# Patient Record
Sex: Female | Born: 1955 | Race: Black or African American | Hispanic: No | Marital: Single | Smoking: Never smoker
Health system: Southern US, Community
[De-identification: ages and names within clinical notes are randomized; demographics above are authoritative.]

## PROBLEM LIST (undated history)

## (undated) DIAGNOSIS — J40 Bronchitis, not specified as acute or chronic: Secondary | ICD-10-CM

## (undated) DIAGNOSIS — N2889 Other specified disorders of kidney and ureter: Secondary | ICD-10-CM

## (undated) DIAGNOSIS — K469 Unspecified abdominal hernia without obstruction or gangrene: Secondary | ICD-10-CM

## (undated) DIAGNOSIS — T7840XA Allergy, unspecified, initial encounter: Secondary | ICD-10-CM

## (undated) DIAGNOSIS — K219 Gastro-esophageal reflux disease without esophagitis: Secondary | ICD-10-CM

## (undated) DIAGNOSIS — N838 Other noninflammatory disorders of ovary, fallopian tube and broad ligament: Secondary | ICD-10-CM

## (undated) DIAGNOSIS — Z87898 Personal history of other specified conditions: Secondary | ICD-10-CM

## (undated) DIAGNOSIS — D649 Anemia, unspecified: Secondary | ICD-10-CM

## (undated) DIAGNOSIS — F419 Anxiety disorder, unspecified: Secondary | ICD-10-CM

## (undated) HISTORY — PX: ESOPHAGOGASTRODUODENOSCOPY: SHX1529

## (undated) HISTORY — DX: Allergy, unspecified, initial encounter: T78.40XA

## (undated) HISTORY — DX: Other noninflammatory disorders of ovary, fallopian tube and broad ligament: N83.8

## (undated) HISTORY — PX: EXPLORATORY LAPAROTOMY: SUR591

## (undated) HISTORY — PX: COLONOSCOPY: SHX174

## (undated) HISTORY — DX: Anxiety disorder, unspecified: F41.9

## (undated) HISTORY — DX: Other specified disorders of kidney and ureter: N28.89

## (undated) HISTORY — DX: Personal history of other specified conditions: Z87.898

## (undated) HISTORY — DX: Anemia, unspecified: D64.9

---

## 1982-12-24 HISTORY — PX: CHOLECYSTECTOMY: SHX55

## 1982-12-24 HISTORY — PX: ABDOMINAL HYSTERECTOMY: SHX81

## 2009-07-14 ENCOUNTER — Encounter: Admission: RE | Admit: 2009-07-14 | Discharge: 2009-07-14 | Payer: Self-pay | Admitting: Infectious Diseases

## 2009-08-18 ENCOUNTER — Emergency Department (HOSPITAL_COMMUNITY): Admission: EM | Admit: 2009-08-18 | Discharge: 2009-08-18 | Payer: Self-pay | Admitting: Emergency Medicine

## 2009-11-15 ENCOUNTER — Emergency Department (HOSPITAL_COMMUNITY): Admission: EM | Admit: 2009-11-15 | Discharge: 2009-11-15 | Payer: Self-pay | Admitting: Family Medicine

## 2009-12-16 ENCOUNTER — Emergency Department (HOSPITAL_COMMUNITY): Admission: EM | Admit: 2009-12-16 | Discharge: 2009-12-17 | Payer: Self-pay | Admitting: Emergency Medicine

## 2009-12-24 DIAGNOSIS — N2889 Other specified disorders of kidney and ureter: Secondary | ICD-10-CM

## 2009-12-24 HISTORY — DX: Other specified disorders of kidney and ureter: N28.89

## 2010-01-04 ENCOUNTER — Encounter (INDEPENDENT_AMBULATORY_CARE_PROVIDER_SITE_OTHER): Payer: Self-pay | Admitting: Family Medicine

## 2010-01-04 ENCOUNTER — Ambulatory Visit: Payer: Self-pay | Admitting: Family Medicine

## 2010-01-04 LAB — CONVERTED CEMR LAB
ALT: 101 units/L — ABNORMAL HIGH (ref 0–35)
AST: 69 units/L — ABNORMAL HIGH (ref 0–37)
Albumin: 4.8 g/dL (ref 3.5–5.2)
Alkaline Phosphatase: 136 units/L — ABNORMAL HIGH (ref 39–117)
BUN: 16 mg/dL (ref 6–23)
Basophils Absolute: 0 10*3/uL (ref 0.0–0.1)
Basophils Relative: 0 % (ref 0–1)
CO2: 23 meq/L (ref 19–32)
Calcium: 9.9 mg/dL (ref 8.4–10.5)
Chloride: 105 meq/L (ref 96–112)
Creatinine, Ser: 0.72 mg/dL (ref 0.40–1.20)
Eosinophils Absolute: 0.2 10*3/uL (ref 0.0–0.7)
Eosinophils Relative: 4 % (ref 0–5)
Glucose, Bld: 87 mg/dL (ref 70–99)
HCT: 37.9 % (ref 36.0–46.0)
Helicobacter Pylori Antibody-IgG: 0.9
Hemoglobin: 12.6 g/dL (ref 12.0–15.0)
Lymphocytes Relative: 53 % — ABNORMAL HIGH (ref 12–46)
Lymphs Abs: 3.1 10*3/uL (ref 0.7–4.0)
MCHC: 33.2 g/dL (ref 30.0–36.0)
MCV: 84.8 fL (ref 78.0–100.0)
Monocytes Absolute: 0.4 10*3/uL (ref 0.1–1.0)
Monocytes Relative: 8 % (ref 3–12)
Neutro Abs: 2.1 10*3/uL (ref 1.7–7.7)
Neutrophils Relative %: 36 % — ABNORMAL LOW (ref 43–77)
Platelets: 365 10*3/uL (ref 150–400)
Potassium: 4 meq/L (ref 3.5–5.3)
RBC: 4.47 M/uL (ref 3.87–5.11)
RDW: 13.3 % (ref 11.5–15.5)
Sed Rate: 31 mm/hr — ABNORMAL HIGH (ref 0–22)
Sodium: 142 meq/L (ref 135–145)
TSH: 1.446 microintl units/mL (ref 0.350–4.500)
Total Bilirubin: 0.2 mg/dL — ABNORMAL LOW (ref 0.3–1.2)
Total Protein: 7.6 g/dL (ref 6.0–8.3)
Vit D, 25-Hydroxy: 22 ng/mL — ABNORMAL LOW (ref 30–89)
WBC: 5.9 10*3/uL (ref 4.0–10.5)

## 2010-01-17 ENCOUNTER — Emergency Department (HOSPITAL_COMMUNITY): Admission: EM | Admit: 2010-01-17 | Discharge: 2010-01-17 | Payer: Self-pay | Admitting: Emergency Medicine

## 2010-01-24 ENCOUNTER — Ambulatory Visit (HOSPITAL_COMMUNITY): Admission: RE | Admit: 2010-01-24 | Discharge: 2010-01-24 | Payer: Self-pay | Admitting: Family Medicine

## 2010-02-03 ENCOUNTER — Ambulatory Visit (HOSPITAL_COMMUNITY): Admission: RE | Admit: 2010-02-03 | Discharge: 2010-02-03 | Payer: Self-pay | Admitting: Family Medicine

## 2010-02-08 ENCOUNTER — Ambulatory Visit: Payer: Self-pay | Admitting: Family Medicine

## 2010-02-17 ENCOUNTER — Ambulatory Visit (HOSPITAL_COMMUNITY): Admission: RE | Admit: 2010-02-17 | Discharge: 2010-02-17 | Payer: Self-pay | Admitting: Family Medicine

## 2010-03-01 ENCOUNTER — Ambulatory Visit (HOSPITAL_COMMUNITY): Admission: RE | Admit: 2010-03-01 | Discharge: 2010-03-01 | Payer: Self-pay | Admitting: Gastroenterology

## 2010-03-15 ENCOUNTER — Ambulatory Visit (HOSPITAL_COMMUNITY): Admission: RE | Admit: 2010-03-15 | Discharge: 2010-03-15 | Payer: Self-pay | Admitting: Gastroenterology

## 2010-07-20 ENCOUNTER — Encounter (INDEPENDENT_AMBULATORY_CARE_PROVIDER_SITE_OTHER): Payer: Self-pay | Admitting: Family Medicine

## 2010-07-20 ENCOUNTER — Ambulatory Visit: Payer: Self-pay | Admitting: Family Medicine

## 2010-07-20 LAB — CONVERTED CEMR LAB
ALT: 70 units/L — ABNORMAL HIGH (ref 0–35)
ANA Titer 1: NEGATIVE
AST: 59 units/L — ABNORMAL HIGH (ref 0–37)
Albumin: 4.8 g/dL (ref 3.5–5.2)
Alkaline Phosphatase: 170 units/L — ABNORMAL HIGH (ref 39–117)
Anti Nuclear Antibody(ANA): POSITIVE — AB
BUN: 14 mg/dL (ref 6–23)
CO2: 24 meq/L (ref 19–32)
CRP: 1.4 mg/dL — ABNORMAL HIGH (ref <0.6)
Calcium: 9 mg/dL (ref 8.4–10.5)
Ceruloplasmin: 47 mg/dL (ref 21–63)
Chloride: 104 meq/L (ref 96–112)
Creatinine, Ser: 0.72 mg/dL (ref 0.40–1.20)
Ferritin: 168 ng/mL (ref 10–291)
Glucose, Bld: 82 mg/dL (ref 70–99)
HCV Ab: NEGATIVE
Hep A IgM: NEGATIVE
Hep A Total Ab: POSITIVE — AB
Hep B C IgM: NEGATIVE
Hep B Core Total Ab: NEGATIVE
Hep B S Ab: POSITIVE — AB
Hepatitis B Surface Ag: NEGATIVE
IgA: 138 mg/dL (ref 68–378)
IgG (Immunoglobin G), Serum: 1150 mg/dL (ref 694–1618)
IgM, Serum: 226 mg/dL (ref 60–263)
Iron: 53 ug/dL (ref 42–145)
Potassium: 4.2 meq/L (ref 3.5–5.3)
Saturation Ratios: 16 % — ABNORMAL LOW (ref 20–55)
Sed Rate: 39 mm/hr — ABNORMAL HIGH (ref 0–22)
Sodium: 141 meq/L (ref 135–145)
TIBC: 341 ug/dL (ref 250–470)
Total Bilirubin: 0.3 mg/dL (ref 0.3–1.2)
Total Protein: 7.7 g/dL (ref 6.0–8.3)
UIBC: 288 ug/dL
Vitamin B-12: 616 pg/mL (ref 211–911)

## 2010-07-25 ENCOUNTER — Ambulatory Visit: Payer: Self-pay | Admitting: Internal Medicine

## 2010-10-05 ENCOUNTER — Emergency Department (HOSPITAL_COMMUNITY): Admission: EM | Admit: 2010-10-05 | Discharge: 2010-10-05 | Payer: Self-pay | Admitting: Emergency Medicine

## 2011-01-09 ENCOUNTER — Ambulatory Visit (HOSPITAL_COMMUNITY)
Admission: RE | Admit: 2011-01-09 | Discharge: 2011-01-09 | Payer: Self-pay | Source: Home / Self Care | Attending: Family Medicine | Admitting: Family Medicine

## 2011-01-15 ENCOUNTER — Emergency Department (HOSPITAL_COMMUNITY)
Admission: EM | Admit: 2011-01-15 | Discharge: 2011-01-16 | Payer: Self-pay | Source: Home / Self Care | Admitting: Emergency Medicine

## 2011-01-16 LAB — CBC
HCT: 34 % — ABNORMAL LOW (ref 36.0–46.0)
Hemoglobin: 11.5 g/dL — ABNORMAL LOW (ref 12.0–15.0)
MCH: 28.2 pg (ref 26.0–34.0)
MCHC: 33.8 g/dL (ref 30.0–36.0)
MCV: 83.3 fL (ref 78.0–100.0)
RBC: 4.08 MIL/uL (ref 3.87–5.11)
RDW: 13.7 % (ref 11.5–15.5)
WBC: 6.2 10*3/uL (ref 4.0–10.5)

## 2011-01-16 LAB — POCT I-STAT, CHEM 8
Calcium, Ion: 1.14 mmol/L (ref 1.12–1.32)
Creatinine, Ser: 0.8 mg/dL (ref 0.4–1.2)
Glucose, Bld: 102 mg/dL — ABNORMAL HIGH (ref 70–99)
Hemoglobin: 11.9 g/dL — ABNORMAL LOW (ref 12.0–15.0)
Potassium: 4.3 mEq/L (ref 3.5–5.1)
TCO2: 24 mmol/L (ref 0–100)

## 2011-01-16 LAB — DIFFERENTIAL
Eosinophils Relative: 1 % (ref 0–5)
Lymphocytes Relative: 36 % (ref 12–46)
Lymphs Abs: 2.2 10*3/uL (ref 0.7–4.0)
Monocytes Absolute: 0.3 10*3/uL (ref 0.1–1.0)
Monocytes Relative: 6 % (ref 3–12)
Neutro Abs: 3.5 10*3/uL (ref 1.7–7.7)

## 2011-01-17 LAB — POCT CARDIAC MARKERS
CKMB, poc: <1 ng/mL — ABNORMAL LOW (ref 1.0–8.0)
Myoglobin, poc: 58.6 ng/mL (ref 12–200)

## 2011-01-17 LAB — TSH: TSH: 2.089 u[IU]/mL (ref 0.350–4.500)

## 2011-01-18 ENCOUNTER — Other Ambulatory Visit (HOSPITAL_COMMUNITY): Payer: Self-pay | Admitting: Family Medicine

## 2011-01-18 DIAGNOSIS — Z1239 Encounter for other screening for malignant neoplasm of breast: Secondary | ICD-10-CM

## 2011-01-24 ENCOUNTER — Ambulatory Visit (HOSPITAL_COMMUNITY): Payer: Self-pay

## 2011-01-26 ENCOUNTER — Ambulatory Visit (HOSPITAL_COMMUNITY)
Admission: RE | Admit: 2011-01-26 | Discharge: 2011-01-26 | Disposition: A | Discharge status: Home / Self Care | Payer: Self-pay | Source: Ambulatory Visit | Attending: Family Medicine | Admitting: Family Medicine

## 2011-01-26 DIAGNOSIS — Z1231 Encounter for screening mammogram for malignant neoplasm of breast: Secondary | ICD-10-CM | POA: Insufficient documentation

## 2011-01-26 DIAGNOSIS — Z1239 Encounter for other screening for malignant neoplasm of breast: Secondary | ICD-10-CM

## 2011-03-05 ENCOUNTER — Other Ambulatory Visit: Payer: Self-pay | Admitting: Family Medicine

## 2011-03-06 ENCOUNTER — Encounter (INDEPENDENT_AMBULATORY_CARE_PROVIDER_SITE_OTHER): Payer: Self-pay | Admitting: *Deleted

## 2011-03-06 LAB — CONVERTED CEMR LAB
Chlamydia, DNA Probe: NEGATIVE
GC Probe Amp, Genital: NEGATIVE

## 2011-03-08 LAB — DIFFERENTIAL
Basophils Absolute: 0 10*3/uL (ref 0.0–0.1)
Basophils Relative: 0 % (ref 0–1)
Eosinophils Absolute: 0.2 10*3/uL (ref 0.0–0.7)
Eosinophils Relative: 3 % (ref 0–5)
Lymphocytes Relative: 44 % (ref 12–46)
Lymphs Abs: 2.7 10*3/uL (ref 0.7–4.0)
Monocytes Absolute: 0.4 10*3/uL (ref 0.1–1.0)
Monocytes Relative: 6 % (ref 3–12)
Neutro Abs: 2.8 10*3/uL (ref 1.7–7.7)
Neutrophils Relative %: 47 % (ref 43–77)

## 2011-03-08 LAB — CBC
HCT: 33.7 % — ABNORMAL LOW (ref 36.0–46.0)
Hemoglobin: 11.3 g/dL — ABNORMAL LOW (ref 12.0–15.0)
MCH: 28.4 pg (ref 26.0–34.0)
MCHC: 33.5 g/dL (ref 30.0–36.0)
MCV: 84.7 fL (ref 78.0–100.0)
Platelets: 236 10*3/uL (ref 150–400)
RBC: 3.98 MIL/uL (ref 3.87–5.11)
RDW: 13.4 % (ref 11.5–15.5)
WBC: 6.1 10*3/uL (ref 4.0–10.5)

## 2011-03-08 LAB — COMPREHENSIVE METABOLIC PANEL
ALT: 32 U/L (ref 0–35)
AST: 28 U/L (ref 0–37)
Albumin: 3.9 g/dL (ref 3.5–5.2)
Alkaline Phosphatase: 102 U/L (ref 39–117)
BUN: 10 mg/dL (ref 6–23)
CO2: 24 mEq/L (ref 19–32)
Calcium: 8.9 mg/dL (ref 8.4–10.5)
Chloride: 111 mEq/L (ref 96–112)
Creatinine, Ser: 0.67 mg/dL (ref 0.4–1.2)
GFR calc Af Amer: >60 mL/min (ref >60)
GFR calc non Af Amer: >60 mL/min (ref >60)
Glucose, Bld: 92 mg/dL (ref 70–99)
Potassium: 3.9 mEq/L (ref 3.5–5.1)
Sodium: 140 mEq/L (ref 135–145)
Total Bilirubin: 0.3 mg/dL (ref 0.3–1.2)
Total Protein: 6.9 g/dL (ref 6.0–8.3)

## 2011-03-08 LAB — URINALYSIS, ROUTINE W REFLEX MICROSCOPIC
Bilirubin Urine: NEGATIVE
Glucose, UA: NEGATIVE mg/dL
Hgb urine dipstick: NEGATIVE
Ketones, ur: NEGATIVE mg/dL
Nitrite: NEGATIVE
Protein, ur: NEGATIVE mg/dL
Specific Gravity, Urine: 1.013 (ref 1.005–1.030)
Urobilinogen, UA: 0.2 mg/dL (ref 0.0–1.0)
pH: 6 (ref 5.0–8.0)

## 2011-03-08 LAB — POCT I-STAT, CHEM 8
BUN: 11 mg/dL (ref 6–23)
Chloride: 111 mEq/L (ref 96–112)
HCT: 34 % — ABNORMAL LOW (ref 36.0–46.0)
Potassium: 3.9 mEq/L (ref 3.5–5.1)
Sodium: 142 mEq/L (ref 135–145)

## 2011-03-08 LAB — LIPASE, BLOOD: Lipase: 22 U/L (ref 11–59)

## 2011-03-09 ENCOUNTER — Encounter (INDEPENDENT_AMBULATORY_CARE_PROVIDER_SITE_OTHER): Payer: Self-pay | Admitting: *Deleted

## 2011-03-09 LAB — CONVERTED CEMR LAB
AST: 30 units/L (ref 0–37)
Alkaline Phosphatase: 121 units/L — ABNORMAL HIGH (ref 39–117)
Bilirubin, Direct: <0.1 mg/dL (ref 0.0–0.3)
Cholesterol: 193 mg/dL (ref 0–200)
HDL: 56 mg/dL (ref >39)
Helicobacter Pylori Antibody-IgG: 0.76
Total CHOL/HDL Ratio: 3.4
Triglycerides: 93 mg/dL (ref <150)
VLDL: 19 mg/dL (ref 0–40)

## 2011-03-19 LAB — HEPATIC FUNCTION PANEL
Albumin: 3.6 g/dL (ref 3.5–5.2)
Total Bilirubin: 0.5 mg/dL (ref 0.3–1.2)
Total Protein: 6.9 g/dL (ref 6.0–8.3)

## 2011-03-25 ENCOUNTER — Emergency Department (HOSPITAL_COMMUNITY)
Admission: EM | Admit: 2011-03-25 | Discharge: 2011-03-25 | Disposition: A | Discharge status: Home / Self Care | Payer: Self-pay | Attending: Emergency Medicine | Admitting: Emergency Medicine

## 2011-03-25 ENCOUNTER — Emergency Department (HOSPITAL_COMMUNITY): Payer: Self-pay

## 2011-03-25 DIAGNOSIS — M25476 Effusion, unspecified foot: Secondary | ICD-10-CM | POA: Insufficient documentation

## 2011-03-25 DIAGNOSIS — S93409A Sprain of unspecified ligament of unspecified ankle, initial encounter: Secondary | ICD-10-CM | POA: Insufficient documentation

## 2011-03-25 DIAGNOSIS — Y9383 Activity, rough housing and horseplay: Secondary | ICD-10-CM | POA: Insufficient documentation

## 2011-03-25 DIAGNOSIS — K219 Gastro-esophageal reflux disease without esophagitis: Secondary | ICD-10-CM | POA: Insufficient documentation

## 2011-03-25 DIAGNOSIS — M25579 Pain in unspecified ankle and joints of unspecified foot: Secondary | ICD-10-CM | POA: Insufficient documentation

## 2011-03-25 DIAGNOSIS — X500XXA Overexertion from strenuous movement or load, initial encounter: Secondary | ICD-10-CM | POA: Insufficient documentation

## 2011-03-25 DIAGNOSIS — S8990XA Unspecified injury of unspecified lower leg, initial encounter: Secondary | ICD-10-CM | POA: Insufficient documentation

## 2011-03-25 DIAGNOSIS — S99929A Unspecified injury of unspecified foot, initial encounter: Secondary | ICD-10-CM | POA: Insufficient documentation

## 2011-03-25 DIAGNOSIS — M25473 Effusion, unspecified ankle: Secondary | ICD-10-CM | POA: Insufficient documentation

## 2011-03-26 LAB — DIFFERENTIAL
Basophils Relative: 0 % (ref 0–1)
Lymphocytes Relative: 45 % (ref 12–46)
Lymphs Abs: 2.9 10*3/uL (ref 0.7–4.0)
Monocytes Absolute: 0.4 10*3/uL (ref 0.1–1.0)
Monocytes Relative: 6 % (ref 3–12)
Neutro Abs: 3 10*3/uL (ref 1.7–7.7)
Neutrophils Relative %: 47 % (ref 43–77)

## 2011-03-26 LAB — COMPREHENSIVE METABOLIC PANEL
Albumin: 4.2 g/dL (ref 3.5–5.2)
BUN: 12 mg/dL (ref 6–23)
Calcium: 9.7 mg/dL (ref 8.4–10.5)
Creatinine, Ser: 0.67 mg/dL (ref 0.4–1.2)
Glucose, Bld: 91 mg/dL (ref 70–99)
Potassium: 3.6 mEq/L (ref 3.5–5.1)
Total Protein: 7.9 g/dL (ref 6.0–8.3)

## 2011-03-26 LAB — URINALYSIS, ROUTINE W REFLEX MICROSCOPIC
Bilirubin Urine: NEGATIVE
Nitrite: NEGATIVE
Specific Gravity, Urine: 1.03 (ref 1.005–1.030)
Urobilinogen, UA: 0.2 mg/dL (ref 0.0–1.0)
pH: 5.5 (ref 5.0–8.0)

## 2011-03-26 LAB — URINE CULTURE: Colony Count: 9000

## 2011-03-26 LAB — PREGNANCY, URINE: Preg Test, Ur: NEGATIVE

## 2011-03-26 LAB — CBC
HCT: 34.3 % — ABNORMAL LOW (ref 36.0–46.0)
Hemoglobin: 11.5 g/dL — ABNORMAL LOW (ref 12.0–15.0)
MCHC: 33.7 g/dL (ref 30.0–36.0)
MCV: 88.4 fL (ref 78.0–100.0)
Platelets: 341 10*3/uL (ref 150–400)
RDW: 13.7 % (ref 11.5–15.5)

## 2011-11-19 ENCOUNTER — Other Ambulatory Visit (HOSPITAL_COMMUNITY): Payer: Self-pay | Admitting: Family Medicine

## 2011-11-19 DIAGNOSIS — Z1231 Encounter for screening mammogram for malignant neoplasm of breast: Secondary | ICD-10-CM

## 2011-11-27 ENCOUNTER — Other Ambulatory Visit (HOSPITAL_COMMUNITY): Payer: Self-pay | Admitting: Family Medicine

## 2011-11-27 DIAGNOSIS — R911 Solitary pulmonary nodule: Secondary | ICD-10-CM

## 2011-12-03 ENCOUNTER — Inpatient Hospital Stay (HOSPITAL_COMMUNITY): Admission: RE | Admit: 2011-12-03 | Payer: Self-pay | Source: Ambulatory Visit

## 2011-12-21 ENCOUNTER — Ambulatory Visit (HOSPITAL_COMMUNITY)
Admission: RE | Admit: 2011-12-21 | Discharge: 2011-12-21 | Disposition: A | Discharge status: Home / Self Care | Payer: Self-pay | Source: Ambulatory Visit | Attending: Family Medicine | Admitting: Family Medicine

## 2011-12-21 DIAGNOSIS — J984 Other disorders of lung: Secondary | ICD-10-CM | POA: Insufficient documentation

## 2011-12-21 DIAGNOSIS — R911 Solitary pulmonary nodule: Secondary | ICD-10-CM

## 2011-12-21 DIAGNOSIS — Z09 Encounter for follow-up examination after completed treatment for conditions other than malignant neoplasm: Secondary | ICD-10-CM | POA: Insufficient documentation

## 2011-12-21 MED ORDER — IOHEXOL 300 MG/ML  SOLN
80.0000 mL | Freq: Once | INTRAMUSCULAR | Status: AC | PRN
  Administered 2011-12-21: 80 mL via INTRAVENOUS

## 2012-01-29 ENCOUNTER — Ambulatory Visit (HOSPITAL_COMMUNITY)
Admission: RE | Admit: 2012-01-29 | Discharge: 2012-01-29 | Disposition: A | Discharge status: Home / Self Care | Payer: Self-pay | Source: Ambulatory Visit | Attending: Family Medicine | Admitting: Family Medicine

## 2012-01-29 DIAGNOSIS — Z1231 Encounter for screening mammogram for malignant neoplasm of breast: Secondary | ICD-10-CM | POA: Insufficient documentation

## 2012-03-12 ENCOUNTER — Other Ambulatory Visit (HOSPITAL_COMMUNITY): Payer: Self-pay | Admitting: Internal Medicine

## 2012-03-12 DIAGNOSIS — Z780 Asymptomatic menopausal state: Secondary | ICD-10-CM

## 2012-03-18 ENCOUNTER — Ambulatory Visit (HOSPITAL_COMMUNITY)
Admission: RE | Admit: 2012-03-18 | Discharge: 2012-03-18 | Disposition: A | Discharge status: Home / Self Care | Payer: Self-pay | Source: Ambulatory Visit | Attending: Internal Medicine | Admitting: Internal Medicine

## 2012-03-18 DIAGNOSIS — Z780 Asymptomatic menopausal state: Secondary | ICD-10-CM

## 2012-03-18 DIAGNOSIS — Z13820 Encounter for screening for osteoporosis: Secondary | ICD-10-CM | POA: Insufficient documentation

## 2012-04-05 ENCOUNTER — Encounter (HOSPITAL_COMMUNITY): Payer: Self-pay | Admitting: Family Medicine

## 2012-04-05 ENCOUNTER — Emergency Department (HOSPITAL_COMMUNITY)
Admission: EM | Admit: 2012-04-05 | Discharge: 2012-04-05 | Disposition: A | Discharge status: Home / Self Care | Payer: Self-pay | Attending: Emergency Medicine | Admitting: Emergency Medicine

## 2012-04-05 DIAGNOSIS — R109 Unspecified abdominal pain: Secondary | ICD-10-CM | POA: Insufficient documentation

## 2012-04-05 DIAGNOSIS — R10815 Periumbilic abdominal tenderness: Secondary | ICD-10-CM | POA: Insufficient documentation

## 2012-04-05 DIAGNOSIS — R10819 Abdominal tenderness, unspecified site: Secondary | ICD-10-CM | POA: Insufficient documentation

## 2012-04-05 DIAGNOSIS — Z9079 Acquired absence of other genital organ(s): Secondary | ICD-10-CM | POA: Insufficient documentation

## 2012-04-05 DIAGNOSIS — N898 Other specified noninflammatory disorders of vagina: Secondary | ICD-10-CM | POA: Insufficient documentation

## 2012-04-05 DIAGNOSIS — Z9889 Other specified postprocedural states: Secondary | ICD-10-CM | POA: Insufficient documentation

## 2012-04-05 DIAGNOSIS — M549 Dorsalgia, unspecified: Secondary | ICD-10-CM | POA: Insufficient documentation

## 2012-04-05 DIAGNOSIS — N949 Unspecified condition associated with female genital organs and menstrual cycle: Secondary | ICD-10-CM | POA: Insufficient documentation

## 2012-04-05 DIAGNOSIS — R110 Nausea: Secondary | ICD-10-CM | POA: Insufficient documentation

## 2012-04-05 LAB — CBC
HCT: 32.1 % — ABNORMAL LOW (ref 36.0–46.0)
MCV: 84 fL (ref 78.0–100.0)
Platelets: 271 10*3/uL (ref 150–400)
RBC: 3.82 MIL/uL — ABNORMAL LOW (ref 3.87–5.11)
RDW: 13.6 % (ref 11.5–15.5)
WBC: 6.3 10*3/uL (ref 4.0–10.5)

## 2012-04-05 LAB — URINALYSIS, ROUTINE W REFLEX MICROSCOPIC
Glucose, UA: NEGATIVE mg/dL
Hgb urine dipstick: NEGATIVE
Leukocytes, UA: NEGATIVE
Protein, ur: NEGATIVE mg/dL
Specific Gravity, Urine: 1.013 (ref 1.005–1.030)

## 2012-04-05 LAB — COMPREHENSIVE METABOLIC PANEL
ALT: 67 U/L — ABNORMAL HIGH (ref 0–35)
AST: 51 U/L — ABNORMAL HIGH (ref 0–37)
Alkaline Phosphatase: 130 U/L — ABNORMAL HIGH (ref 39–117)
CO2: 27 mEq/L (ref 19–32)
Chloride: 104 mEq/L (ref 96–112)
GFR calc Af Amer: >90 mL/min (ref >90)
GFR calc non Af Amer: >90 mL/min (ref >90)
Glucose, Bld: 92 mg/dL (ref 70–99)
Sodium: 139 mEq/L (ref 135–145)
Total Bilirubin: 0.2 mg/dL — ABNORMAL LOW (ref 0.3–1.2)

## 2012-04-05 LAB — DIFFERENTIAL
Basophils Absolute: 0 10*3/uL (ref 0.0–0.1)
Lymphocytes Relative: 53 % — ABNORMAL HIGH (ref 12–46)
Lymphs Abs: 3.3 10*3/uL (ref 0.7–4.0)
Monocytes Absolute: 0.4 10*3/uL (ref 0.1–1.0)
Neutro Abs: 2.4 10*3/uL (ref 1.7–7.7)

## 2012-04-05 MED ORDER — MORPHINE SULFATE 4 MG/ML IJ SOLN
4.0000 mg | Freq: Once | INTRAMUSCULAR | Status: AC
  Administered 2012-04-05: 4 mg via INTRAVENOUS
  Filled 2012-04-05: qty 1

## 2012-04-05 MED ORDER — ONDANSETRON HCL 4 MG/2ML IJ SOLN
4.0000 mg | Freq: Once | INTRAMUSCULAR | Status: AC
  Administered 2012-04-05: 4 mg via INTRAVENOUS
  Filled 2012-04-05: qty 2

## 2012-04-05 MED ORDER — PROMETHAZINE HCL 25 MG PO TABS: 25.0000 mg | ORAL_TABLET | Freq: Four times a day (QID) | ORAL | Status: DC | PRN

## 2012-04-05 MED ORDER — OXYCODONE-ACETAMINOPHEN 5-325 MG PO TABS: 1.0000 | ORAL_TABLET | Freq: Four times a day (QID) | ORAL | Status: AC | PRN

## 2012-04-05 NOTE — Discharge Instructions (Signed)
Please read and follow all provided instructions.  Your diagnoses today include:  1. Abdominal pain     Tests performed today include:  Blood counts - shows mild anemia  Liver function tests - slightly elevated  Urine test - normal  Wet prep - normal  Vital signs. See below for your results today.   Medications prescribed:   Percocet (oxycodone/acetaminophen) - narcotic pain medication  You have been prescribed narcotic pain medication such as Vicodin or Percocet: DO NOT drive or perform any activities that require you to be awake and alert because this medicine can make you drowsy. BE VERY CAREFUL not to take multiple medicines containing Tylenol (also called acetaminophen). Doing so can lead to an overdose which can damage your liver and cause liver failure and possibly death.    Phenergan (promethazine) - for nausea and vomiting  Take any prescribed medications only as directed.  Home care instructions:   Follow any educational materials contained in this packet.  Follow-up instructions: Please follow-up with your primary care provider in the next 2 days for further evaluation of your symptoms. If you do not have a primary care doctor -- see below for referral information.   Return instructions:  SEEK IMMEDIATE MEDICAL ATTENTION IF:  The pain does not go away or becomes severe   A temperature above 101F develops   Repeated vomiting occurs (multiple episodes)   The pain becomes localized to portions of the abdomen. The right side could possibly be appendicitis. In an adult, the left lower portion of the abdomen could be colitis or diverticulitis.   Blood is being passed in stools or vomit (bright red or black tarry stools)   You develop chest pain, difficulty breathing, dizziness or fainting, or become confused, poorly responsive, or inconsolable (young children)  If you have any other emergent concerns regarding your health  Additional Information: Abdominal  (belly) pain can be caused by many things. Your caregiver performed an examination and possibly ordered blood/urine tests and imaging (CT scan, x-rays, ultrasound). Many cases can be observed and treated at home after initial evaluation in the emergency department. Even though you are being discharged home, abdominal pain can be unpredictable. Therefore, you need a repeated exam if your pain does not resolve, returns, or worsens. Most patients with abdominal pain don't have to be admitted to the hospital or have surgery, but serious problems like appendicitis and gallbladder attacks can start out as nonspecific pain. Many abdominal conditions cannot be diagnosed in one visit, so follow-up evaluations are very important.  Your vital signs today were: BP 149/82  Pulse 72  Temp(Src) 98 F (36.7 C) (Oral)  Resp 20  SpO2 98% If your blood pressure (bp) was elevated above 135/85 this visit, please have this repeated by your doctor within one month. -------------- No Primary Care Doctor Call [facility]  [phone] Other agencies that provide inexpensive medical care    [facility]  [phone]    [facility]  [phone]    [facility]  [phone]    [facility]  [phone]    [facility]  [phone]    [facility]  [phone] -------------- RESOURCE GUIDE:  Dental Problems  Patients with Medicaid: [facility] [phone]  [address] Phone:  [phone]                                                      Phone:  [phone]  If unable to pay or uninsured, contact:  [facility] or [facility]. to become qualified for the adult dental clinic.  Chronic Pain Problems Contact [facility]  [phone] Patients need to be referred by their primary care doctor.  Insufficient Money for Medicine Contact  [employer]:  call "211" or [facility] [phone].  Psychological Services [facility]  [phone] [facility]  [phone] [facility]   [phone] (emergency services [phone])  Substance Abuse Resources [facility]  [phone] [facility] [phone] [facility] [place] [phone] Floydene Flock [phone] Residential & Outpatient Substance Abuse Program  [phone]  Abuse/Neglect [facility] Child Abuse Hotline [phone] [facility] Child Abuse Hotline [phone] (After Hours)  Emergency Shelter [facility] [phone]  Maternity Homes [facility] [place] [facility] [place] [phone] [facility] [phone]  [facility] Resources  [facility]     [employer]                          [facility] Dept. [address]                        [address]      [address]  Blondell Reveal Phone:  [phone]                                   Phone:  [phone]                 Phone:  [phone]  [facility] Mental Health Phone:  [phone]  [facility] Child Abuse Hotline [phone])[phone] (After Hours)

## 2012-04-05 NOTE — ED Notes (Signed)
Pt sts abdominal pain and vaginal pressure since last night radiating into back and legs. sts nausea. sts also diarrhea. Pt describes the pain as feeling like she did when she was in labor.

## 2012-04-05 NOTE — ED Notes (Signed)
MD at bedside. 

## 2012-04-05 NOTE — ED Provider Notes (Signed)
History     CSN: [id number]  Arrival date & time 04/05/12  1805   First MD Initiated Contact with Patient 04/05/12 2006      Chief Complaint  Patient presents with  . Abdominal Pain    (Consider location/radiation/quality/duration/timing/severity/associated sxs/prior treatment) HPI Comments: Patient with history of hysterectomy, cholecystectomy -- presents with lower abdominal pain, vaginal pain and "pressure" that began acutely last evening. The patient has not had symptoms like this in the past. Pain radiates into her lower back. She's had nausea but no vomiting. She denies fever. Denies vaginal discharge or bleeding. Denies dysuria or hematuria. Being on her feet makes the pain worse. Lying flat makes the pain better. Patient took ibuprofen this morning with some relief during the day. Patient states pain feels like she is in labor.   Patient is a 56 y.o. female presenting with abdominal pain. The history is provided by the patient.  Abdominal Pain The primary symptoms of the illness include abdominal pain and nausea. The primary symptoms of the illness do not include fever, shortness of breath, vomiting, diarrhea, dysuria, vaginal discharge or vaginal bleeding. The current episode started yesterday. The onset of the illness was sudden. The problem has not changed since onset. The patient states that she believes she is currently not pregnant. Additional symptoms associated with the illness include back pain. Symptoms associated with the illness do not include constipation.    History reviewed. No pertinent past medical history.  Past Surgical History  Procedure Date  . Cholecystectomy     History reviewed. No pertinent family history.  History  Substance Use Topics  . Smoking status: Never Smoker   . Smokeless tobacco: Not on file  . Alcohol Use: No    OB History    Grav Para Term Preterm Abortions TAB SAB Ect Mult Living                  Review of Systems    Constitutional: Negative for fever.  HENT: Negative for sore throat and rhinorrhea.   Eyes: Negative for redness.  Respiratory: Negative for cough and shortness of breath.   Cardiovascular: Negative for chest pain.  Gastrointestinal: Positive for nausea and abdominal pain. Negative for vomiting, diarrhea, constipation and blood in stool.  Genitourinary: Negative for dysuria, vaginal bleeding and vaginal discharge.  Musculoskeletal: Positive for back pain. Negative for myalgias.  Skin: Negative for rash.  Neurological: Negative for headaches.    Allergies  Review of patient's allergies indicates no known allergies.  Home Medications   Current Outpatient Rx  Name Route Sig Dispense Refill  . CALCIUM CARBONATE 1250 MG PO CHEW Oral Chew 1 tablet by mouth daily.    . IBUPROFEN 200 MG PO TABS Oral Take 400 mg by mouth every 6 (six) hours as needed. For pain    . ADULT MULTIVITAMIN W/MINERALS CH Oral Take 1 tablet by mouth daily.    Marland Kitchen VITAMIN D (ERGOCALCIFEROL) 50000 UNITS PO CAPS Oral Take 50,000 Units by mouth every 7 (seven) days.      BP 122/70  Pulse 86  Temp(Src) 98.7 F (37.1 C) (Oral)  Resp 20  SpO2 98%  Physical Exam  Nursing note and vitals reviewed. Constitutional: She is oriented to person, place, and time. She appears well-developed and well-nourished.  HENT:  Head: Normocephalic and atraumatic.  Eyes: Conjunctivae are normal. Right eye exhibits no discharge. Left eye exhibits no discharge.  Neck: Normal range of motion. Neck supple.  Cardiovascular: Normal rate, regular rhythm  and normal heart sounds.   Pulmonary/Chest: Effort normal and breath sounds normal.  Abdominal: Soft. Bowel sounds are normal. She exhibits no distension and no mass. There is tenderness in the periumbilical area and suprapubic area. There is no rebound, no guarding and no CVA tenderness. No hernia.    Genitourinary: Right adnexum displays no mass, no tenderness and no fullness. Left adnexum  displays no mass, no tenderness and no fullness. No tenderness or bleeding around the vagina. Vaginal discharge found.       Cervix absent.   Neurological: She is alert and oriented to person, place, and time.  Skin: Skin is warm and dry.  Psychiatric: She has a normal mood and affect.    ED Course  Procedures (including critical care time)  Labs Reviewed  CBC - Abnormal; Notable for the following:    RBC 3.82 (*)    Hemoglobin 10.9 (*)    HCT 32.1 (*)    All other components within normal limits  DIFFERENTIAL - Abnormal; Notable for the following:    Neutrophils Relative 38 (*)    Lymphocytes Relative 53 (*)    All other components within normal limits  COMPREHENSIVE METABOLIC PANEL - Abnormal; Notable for the following:    AST 51 (*)    ALT 67 (*)    Alkaline Phosphatase 130 (*)    Total Bilirubin 0.2 (*)    All other components within normal limits  WET PREP, GENITAL - Abnormal; Notable for the following:    Clue Cells Wet Prep HPF POC FEW (*)    WBC, Wet Prep HPF POC FEW (*)    All other components within normal limits  URINALYSIS, ROUTINE W REFLEX MICROSCOPIC  GC/CHLAMYDIA PROBE AMP, GENITAL   No results found.   1. Abdominal pain     8:16 PM Patient seen and examined. Work-up initiated. Medications ordered.   Vital signs reviewed and are as follows: Filed Vitals:   04/05/12 1813  BP: 122/70  Pulse: 86  Temp: 98.7 F (37.1 C)  Resp: 20   8:59 PM Pelvic exam performed with assistance from nurse.   Patient symptoms are improved with treatment in the emergency department. Patient and results discussed with Dr. Effie Shy. Patient informed of all results. She is tolerating PO's. She appears well.   Patient is agreeable to PCP followup. Will discharge home with pain medication and Phenergan.  The patient was urged to return to the Emergency Department immediately with worsening of current symptoms, worsening abdominal pain, persistent vomiting, blood noted in  stools, fever, or any other concerns. The patient verbalized understanding.   Patient counseled on use of narcotic pain medications. Counseled not to combine these medications with others containing tylenol. Urged not to drink alcohol, drive, or perform any other activities that requires focus while taking these medications. The patient verbalizes understanding and agrees with the plan.  MDM  Patient with lower abdominal, pelvic pain. History of hysterectomy. Lab workup is unconcerning. Pelvic exam is unconcerning. No prolapse seen. Do not suspect appendicitis, colitis, torsion, vascular etiology. Symptoms are improved in emergency department. She has appropriate followup. Strict return instructions given.        Renne Crigler, [place] 04/05/12 2316

## 2012-04-05 NOTE — ED Notes (Addendum)
Diarrhea for one week took some med and it stopped, then last night it started again with vaginal pain, abdominal cramps, back pain and back pressure, leg pain.  Diarrhea x 6 times since last night. Nauseated.

## 2012-04-05 NOTE — ED Notes (Signed)
Stated understanding to dc instructions, RX x2given to pt.

## 2012-04-06 NOTE — ED Provider Notes (Signed)
Medical screening examination/treatment/procedure(s) were performed by non-physician practitioner and as supervising physician I was immediately available for consultation/collaboration.  Flint Melter, MD 04/06/12 [phone]

## 2012-11-15 ENCOUNTER — Emergency Department (HOSPITAL_COMMUNITY)
Admission: EM | Admit: 2012-11-15 | Discharge: 2012-11-15 | Disposition: A | Discharge status: Home / Self Care | Payer: Self-pay | Attending: Emergency Medicine | Admitting: Emergency Medicine

## 2012-11-15 ENCOUNTER — Emergency Department (HOSPITAL_COMMUNITY): Payer: Self-pay

## 2012-11-15 ENCOUNTER — Encounter (HOSPITAL_COMMUNITY): Payer: Self-pay | Admitting: Emergency Medicine

## 2012-11-15 DIAGNOSIS — R071 Chest pain on breathing: Secondary | ICD-10-CM | POA: Insufficient documentation

## 2012-11-15 DIAGNOSIS — R0789 Other chest pain: Secondary | ICD-10-CM

## 2012-11-15 DIAGNOSIS — R0602 Shortness of breath: Secondary | ICD-10-CM | POA: Insufficient documentation

## 2012-11-15 LAB — CBC WITH DIFFERENTIAL/PLATELET
Eosinophils Absolute: 0.2 10*3/uL (ref 0.0–0.7)
Eosinophils Relative: 3 % (ref 0–5)
HCT: 35.2 % — ABNORMAL LOW (ref 36.0–46.0)
Hemoglobin: 12 g/dL (ref 12.0–15.0)
Lymphocytes Relative: 49 % — ABNORMAL HIGH (ref 12–46)
Lymphs Abs: 2.6 10*3/uL (ref 0.7–4.0)
MCH: 28.8 pg (ref 26.0–34.0)
MCV: 84.4 fL (ref 78.0–100.0)
Monocytes Relative: 5 % (ref 3–12)
RBC: 4.17 MIL/uL (ref 3.87–5.11)
WBC: 5.4 10*3/uL (ref 4.0–10.5)

## 2012-11-15 LAB — POCT I-STAT TROPONIN I

## 2012-11-15 LAB — BASIC METABOLIC PANEL
BUN: 11 mg/dL (ref 6–23)
CO2: 25 mEq/L (ref 19–32)
Calcium: 9.4 mg/dL (ref 8.4–10.5)
GFR calc non Af Amer: >90 mL/min (ref >90)
Glucose, Bld: 132 mg/dL — ABNORMAL HIGH (ref 70–99)
Potassium: 3.7 mEq/L (ref 3.5–5.1)

## 2012-11-15 LAB — D-DIMER, QUANTITATIVE: D-Dimer, Quant: <0.27 ug/mL-FEU (ref 0.00–0.48)

## 2012-11-15 MED ORDER — OMEPRAZOLE 20 MG PO CPDR: 40.0000 mg | DELAYED_RELEASE_CAPSULE | Freq: Every day | ORAL | Status: DC

## 2012-11-15 MED ORDER — HYDROCODONE-ACETAMINOPHEN 5-325 MG PO TABS: 1.0000 | ORAL_TABLET | Freq: Four times a day (QID) | ORAL | Status: DC | PRN

## 2012-11-15 MED ORDER — ASPIRIN 81 MG PO CHEW: 162.0000 mg | CHEWABLE_TABLET | Freq: Every day | ORAL | Status: DC

## 2012-11-15 NOTE — ED Notes (Signed)
Pt c/o midsternal CP with radiation to left arm that is worse with palpation and movement x 2 days; pt sts some SOB

## 2012-11-15 NOTE — ED Provider Notes (Addendum)
History     CSN: [id number] Arrival date & time 11/15/12  1537 First MD Initiated Contact with Patient 11/15/12 1722      Chief Complaint  Patient presents with  . Chest Pain    Patient is a 56 y.o. female presenting with chest pain. The history is provided by the patient.  Chest Pain The chest pain began 12 - 24 hours ago. Chest pain occurs constantly. The chest pain is unchanged (It got better early in the am but returned again later today.). The pain is associated with breathing and exertion. The severity of the pain is moderate. The quality of the pain is described as tightness (like a pulling sensation in the left chest). The pain radiates to the left arm. Exacerbated by: palpation of chest. Primary symptoms include shortness of breath. Pertinent negatives for primary symptoms include no fever, no fatigue, no cough, no wheezing, no abdominal pain, no nausea and no vomiting. She tried nothing for the symptoms. There are no known risk factors.  Pertinent negatives for past medical history include no CAD and no MI.  Her family medical history is significant for early MI in family (father may have had MI in 20s but no stents or subsequent heart issues).  Pertinent negatives for family medical history include: no PE in family.     History reviewed. No pertinent past medical history.  Past Surgical History  Procedure Date  . Cholecystectomy     History reviewed. No pertinent family history.  History  Substance Use Topics  . Smoking status: Never Smoker   . Smokeless tobacco: Not on file  . Alcohol Use: No    OB History    Grav Para Term Preterm Abortions TAB SAB Ect Mult Living                  Review of Systems  Constitutional: Negative for fever and fatigue.  Respiratory: Positive for shortness of breath. Negative for cough and wheezing.   Cardiovascular: Positive for chest pain.  Gastrointestinal: Negative for nausea, vomiting and abdominal pain.  All other systems  reviewed and are negative.    Allergies  Review of patient's allergies indicates no known allergies.  Home Medications  No current outpatient prescriptions on file.  BP 128/65  Pulse 88  Temp 98.4 F (36.9 C) (Oral)  Resp 20  SpO2 95%  Physical Exam  Nursing note and vitals reviewed. Constitutional: She appears well-developed and well-nourished. No distress.  HENT:  Head: Normocephalic and atraumatic.  Right Ear: External ear normal.  Left Ear: External ear normal.  Eyes: Conjunctivae normal are normal. Right eye exhibits no discharge. Left eye exhibits no discharge. No scleral icterus.  Neck: Neck supple. No tracheal deviation present.  Cardiovascular: Normal rate, regular rhythm and intact distal pulses.   Pulmonary/Chest: Effort normal and breath sounds normal. No stridor. No respiratory distress. She has no wheezes. She has no rales. She exhibits tenderness (significant ttp left chest wall).  Abdominal: Soft. Bowel sounds are normal. She exhibits no distension. There is no tenderness. There is no rebound and no guarding.  Musculoskeletal: She exhibits no edema and no tenderness.       Pain and pulling sensation in chest with range of motion left arm, nl pulses   Neurological: She is alert. She has normal strength. No sensory deficit. Cranial nerve deficit:  no gross defecits noted. She exhibits normal muscle tone. She displays no seizure activity. Coordination normal.       Nl sensation  and grip strength   Skin: Skin is warm and dry. No rash noted.  Psychiatric: She has a normal mood and affect.    ED Course  Procedures (including critical care time) EKG Rate 70 Normal sinus rhythm Low voltage QRS Nonspecific T wave abnormality Abnormal ECG T wave changes are new compared to prior EKG   Rate:67  Rhythm: normal sinus rhythm  QRS Axis: normal  Intervals: normal  ST/T Wave abnormalities: boderline t wave changes Conduction Disutrbances:none  Narrative  Interpretation: abnl  Old EKG Reviewed: no sig change compared to previous   Labs Reviewed  CBC WITH DIFFERENTIAL - Abnormal; Notable for the following:    HCT 35.2 (*)     Neutrophils Relative 42 (*)     Lymphocytes Relative 49 (*)     All other components within normal limits  BASIC METABOLIC PANEL - Abnormal; Notable for the following:    Glucose, Bld 132 (*)     All other components within normal limits  POCT I-STAT TROPONIN I  D-DIMER, QUANTITATIVE  POCT I-STAT TROPONIN I   Dg Chest 2 View  11/15/2012  *RADIOLOGY REPORT*  Clinical Data:  Chest pain and shortness of breath.  CHEST - 2 VIEW  Comparison: 01/15/2011  Findings: The heart size and mediastinal contours are within normal limits.  Both lungs are clear.  The visualized skeletal structures are unremarkable.  IMPRESSION: No active disease.   Original Report Authenticated By: Irish Lack, M.D.      1. Chest wall pain       MDM  Pt low risk for ACS.  Symptoms atypical for cardiac.  Nl cardiac enzymes.  Low risk for PE with negative d dimer. She has chest wall ttp which I suspect is the etiology.   Will dc home with pain medications.  Close follow up with PCP.  Return for recurrent symptoms       Celene Kras, MD 11/15/12 1952  Pt states she normally takes meds for acid reflux but [facility] closed.  She requested an rx  Celene Kras, MD 11/15/12 2022

## 2013-04-26 ENCOUNTER — Encounter (HOSPITAL_COMMUNITY): Payer: Self-pay | Admitting: *Deleted

## 2013-04-26 ENCOUNTER — Emergency Department (INDEPENDENT_AMBULATORY_CARE_PROVIDER_SITE_OTHER)
Admission: EM | Admit: 2013-04-26 | Discharge: 2013-04-26 | Disposition: A | Discharge status: Home / Self Care | Payer: Self-pay | Source: Home / Self Care | Attending: Family Medicine | Admitting: Family Medicine

## 2013-04-26 DIAGNOSIS — J309 Allergic rhinitis, unspecified: Secondary | ICD-10-CM

## 2013-04-26 DIAGNOSIS — J302 Other seasonal allergic rhinitis: Secondary | ICD-10-CM

## 2013-04-26 MED ORDER — TRIAMCINOLONE ACETONIDE 40 MG/ML IJ SUSP
40.0000 mg | Freq: Once | INTRAMUSCULAR | Status: AC
  Administered 2013-04-26: 40 mg via INTRAMUSCULAR

## 2013-04-26 MED ORDER — FEXOFENADINE HCL 180 MG PO TABS
180.0000 mg | ORAL_TABLET | Freq: Every day | ORAL | Status: DC
Start: 2013-04-26 — End: 2013-05-03

## 2013-04-26 MED ORDER — ZOLPIDEM TARTRATE 10 MG PO TABS: 10.0000 mg | ORAL_TABLET | Freq: Every evening | ORAL | Status: DC | PRN

## 2013-04-26 MED ORDER — METHYLPREDNISOLONE ACETATE 40 MG/ML IJ SUSP
80.0000 mg | Freq: Once | INTRAMUSCULAR | Status: AC
  Administered 2013-04-26: 80 mg via INTRAMUSCULAR

## 2013-04-26 MED ORDER — BUDESONIDE 32 MCG/ACT NA SUSP: 1.0000 | Freq: Two times a day (BID) | NASAL | Status: DC

## 2013-04-26 MED ORDER — METHYLPREDNISOLONE ACETATE 40 MG/ML IJ SUSP
INTRAMUSCULAR | Status: AC
  Filled 2013-04-26: qty 10

## 2013-04-26 MED ORDER — TRIAMCINOLONE ACETONIDE 40 MG/ML IJ SUSP
INTRAMUSCULAR | Status: AC
  Filled 2013-04-26: qty 5

## 2013-04-26 NOTE — ED Notes (Signed)
Patient complains of shortness of breath, cough, fatigue with chills. Denies fever, nausea/vomiting, diarrhea.

## 2013-04-26 NOTE — ED Provider Notes (Signed)
History     CSN: [id number]  Arrival date & time 04/26/13  1356   First MD Initiated Contact with Patient 04/26/13 1446      Chief Complaint  Patient presents with  . URI    (Consider location/radiation/quality/duration/timing/severity/associated sxs/prior treatment) Patient is a 57 y.o. female presenting with URI. The history is provided by the patient.  URI Presenting symptoms: congestion, cough, fatigue and rhinorrhea   Severity:  Moderate Onset quality:  Gradual Duration:  4 days Progression:  Unchanged Chronicity:  New Relieved by:  None tried Worsened by:  Nothing tried Ineffective treatments:  None tried Associated symptoms: myalgias   Associated symptoms: no wheezing     History reviewed. No pertinent past medical history.  Past Surgical History  Procedure Laterality Date  . Cholecystectomy      No family history on file.  History  Substance Use Topics  . Smoking status: Never Smoker   . Smokeless tobacco: Not on file  . Alcohol Use: No    OB History   Grav Para Term Preterm Abortions TAB SAB Ect Mult Living                  Review of Systems  Constitutional: Positive for fatigue.  HENT: Positive for congestion and rhinorrhea.   Respiratory: Positive for cough. Negative for wheezing.   Gastrointestinal: Negative.   Genitourinary: Negative.   Musculoskeletal: Positive for myalgias.  Skin: Negative.     Allergies  Review of patient's allergies indicates no known allergies.  Home Medications   Current Outpatient Rx  Name  Route  Sig  Dispense  Refill  . aspirin 81 MG chewable tablet   Oral   Chew 2 tablets (162 mg total) by mouth daily.   60 tablet   1   . budesonide (RHINOCORT AQUA) 32 MCG/ACT nasal spray   Nasal   Place 1 spray into the nose 2 (two) times daily.   1 Bottle   1   . fexofenadine (ALLEGRA) 180 MG tablet   Oral   Take 1 tablet (180 mg total) by mouth daily.   30 tablet   1   . HYDROcodone-acetaminophen (NORCO)  5-325 MG per tablet   Oral   Take 1-2 tablets by mouth every 6 (six) hours as needed for pain.   20 tablet   0   . omeprazole (PRILOSEC) 20 MG capsule   Oral   Take 2 capsules (40 mg total) by mouth daily.   14 capsule   1     BP 122/85  Pulse 68  Temp(Src) 98.1 F (36.7 C) (Oral)  Resp 16  SpO2 97%  Physical Exam  Nursing note and vitals reviewed. Constitutional: She is oriented to person, place, and time. She appears well-developed and well-nourished. No distress.  HENT:  Head: Normocephalic.  Right Ear: External ear normal.  Left Ear: External ear normal.  Mouth/Throat: Oropharynx is clear and moist.  Eyes: Conjunctivae are normal. Pupils are equal, round, and reactive to light.  Neck: Normal range of motion. Neck supple.  Cardiovascular: Normal rate, regular rhythm, normal heart sounds and intact distal pulses.   Pulmonary/Chest: Effort normal and breath sounds normal.  Abdominal: Soft. Bowel sounds are normal.  Neurological: She is alert and oriented to person, place, and time.  Skin: Skin is warm and dry.    ED Course  Procedures (including critical care time)  Labs Reviewed - No data to display No results found.   1. Seasonal allergic reaction  MDM          Linna Hoff, MD 04/26/13 1501

## 2013-05-02 ENCOUNTER — Encounter (HOSPITAL_COMMUNITY): Payer: Self-pay | Admitting: *Deleted

## 2013-05-02 DIAGNOSIS — J3489 Other specified disorders of nose and nasal sinuses: Secondary | ICD-10-CM | POA: Insufficient documentation

## 2013-05-02 DIAGNOSIS — R61 Generalized hyperhidrosis: Secondary | ICD-10-CM | POA: Insufficient documentation

## 2013-05-02 DIAGNOSIS — R05 Cough: Secondary | ICD-10-CM | POA: Insufficient documentation

## 2013-05-02 DIAGNOSIS — R062 Wheezing: Secondary | ICD-10-CM | POA: Insufficient documentation

## 2013-05-02 DIAGNOSIS — R059 Cough, unspecified: Secondary | ICD-10-CM | POA: Insufficient documentation

## 2013-05-02 DIAGNOSIS — Z79899 Other long term (current) drug therapy: Secondary | ICD-10-CM | POA: Insufficient documentation

## 2013-05-02 DIAGNOSIS — J209 Acute bronchitis, unspecified: Secondary | ICD-10-CM | POA: Insufficient documentation

## 2013-05-02 DIAGNOSIS — R51 Headache: Secondary | ICD-10-CM | POA: Insufficient documentation

## 2013-05-02 NOTE — ED Notes (Signed)
3 weeks of sob, cough (non productive); h/a x 4 days, body aches, chills, sweating, fatigued. Went to urgent care last Sunday and was given a cortisone shot, allegra, and nasal spray. But nothing helps.

## 2013-05-03 ENCOUNTER — Emergency Department (HOSPITAL_COMMUNITY)
Admission: EM | Admit: 2013-05-03 | Discharge: 2013-05-03 | Disposition: A | Discharge status: Home / Self Care | Payer: Self-pay | Attending: Emergency Medicine | Admitting: Emergency Medicine

## 2013-05-03 ENCOUNTER — Emergency Department (HOSPITAL_COMMUNITY): Payer: Self-pay

## 2013-05-03 DIAGNOSIS — J40 Bronchitis, not specified as acute or chronic: Secondary | ICD-10-CM

## 2013-05-03 HISTORY — DX: Bronchitis, not specified as acute or chronic: J40

## 2013-05-03 LAB — CBC WITH DIFFERENTIAL/PLATELET
Basophils Absolute: 0 10*3/uL (ref 0.0–0.1)
Eosinophils Relative: 2 % (ref 0–5)
Lymphocytes Relative: 39 % (ref 12–46)
Lymphs Abs: 4 10*3/uL (ref 0.7–4.0)
MCV: 83.3 fL (ref 78.0–100.0)
Neutro Abs: 5.6 10*3/uL (ref 1.7–7.7)
Platelets: 359 10*3/uL (ref 150–400)
RBC: 4.37 MIL/uL (ref 3.87–5.11)
RDW: 13.4 % (ref 11.5–15.5)
WBC: 10.3 10*3/uL (ref 4.0–10.5)

## 2013-05-03 LAB — COMPREHENSIVE METABOLIC PANEL
ALT: 49 U/L — ABNORMAL HIGH (ref 0–35)
AST: 26 U/L (ref 0–37)
Alkaline Phosphatase: 122 U/L — ABNORMAL HIGH (ref 39–117)
CO2: 28 mEq/L (ref 19–32)
Chloride: 104 mEq/L (ref 96–112)
GFR calc Af Amer: >90 mL/min (ref >90)
GFR calc non Af Amer: 82 mL/min — ABNORMAL LOW (ref >90)
Glucose, Bld: 110 mg/dL — ABNORMAL HIGH (ref 70–99)
Potassium: 4.3 mEq/L (ref 3.5–5.1)
Sodium: 141 mEq/L (ref 135–145)
Total Bilirubin: 0.2 mg/dL — ABNORMAL LOW (ref 0.3–1.2)

## 2013-05-03 MED ORDER — IPRATROPIUM BROMIDE 0.02 % IN SOLN
0.5000 mg | Freq: Once | RESPIRATORY_TRACT | Status: AC
  Administered 2013-05-03: 0.5 mg via RESPIRATORY_TRACT
  Filled 2013-05-03: qty 2.5

## 2013-05-03 MED ORDER — ALBUTEROL SULFATE (5 MG/ML) 0.5% IN NEBU
2.5000 mg | INHALATION_SOLUTION | Freq: Once | RESPIRATORY_TRACT | Status: AC
  Administered 2013-05-03: 2.5 mg via RESPIRATORY_TRACT
  Filled 2013-05-03: qty 0.5

## 2013-05-03 MED ORDER — ALBUTEROL SULFATE HFA 108 (90 BASE) MCG/ACT IN AERS
2.0000 | INHALATION_SPRAY | Freq: Four times a day (QID) | RESPIRATORY_TRACT | Status: DC
  Administered 2013-05-03: 2 via RESPIRATORY_TRACT
  Filled 2013-05-03: qty 6.7

## 2013-05-03 MED ORDER — FLUCONAZOLE 100 MG PO TABS
150.0000 mg | ORAL_TABLET | Freq: Once | ORAL | Status: AC
  Administered 2013-05-03: 150 mg via ORAL
  Filled 2013-05-03: qty 2

## 2013-05-03 MED ORDER — AZITHROMYCIN 250 MG PO TABS: 250.0000 mg | ORAL_TABLET | Freq: Every day | ORAL | Status: DC

## 2013-05-03 NOTE — ED Notes (Signed)
Respiratory at bedside.

## 2013-05-03 NOTE — ED Notes (Signed)
MD at bedside. 

## 2013-05-03 NOTE — ED Provider Notes (Addendum)
History     CSN: [id number]  Arrival date & time 05/02/13  2220   First MD Initiated Contact with Patient 05/03/13 0027      Chief Complaint  Patient presents with  . Shortness of Breath  . Cough    (Consider location/radiation/quality/duration/timing/severity/associated sxs/prior treatment) Patient is a 57 y.o. female presenting with shortness of breath and cough. The history is provided by the patient.  Shortness of Breath Associated symptoms: cough, diaphoresis and headaches   Associated symptoms: no abdominal pain, no chest pain, no fever, no rash, no vomiting and no wheezing   Cough Associated symptoms: chills, diaphoresis, headaches and shortness of breath   Associated symptoms: no chest pain, no fever, no rash and no wheezing    Patient with a history 3 weeks of shortness of breath cough nonproductive mild headache bodyaches chills sweating fatigue patient was seen in urgent care last Sunday and and treated with the nasal steroids and Allegra. These did not improve things very much. Patient seems to think things have gotten worse in the last few days. Patient works night shift.  Past Medical History  Diagnosis Date  . Bronchitis     Past Surgical History  Procedure Laterality Date  . Cholecystectomy      No family history on file.  History  Substance Use Topics  . Smoking status: Never Smoker   . Smokeless tobacco: Not on file  . Alcohol Use: No    OB History   Grav Para Term Preterm Abortions TAB SAB Ect Mult Living                  Review of Systems  Constitutional: Positive for chills, diaphoresis and fatigue. Negative for fever.  HENT: Positive for congestion.   Respiratory: Positive for cough and shortness of breath. Negative for wheezing.   Cardiovascular: Negative for chest pain.  Gastrointestinal: Negative for nausea, vomiting and abdominal pain.  Genitourinary: Negative for dysuria.  Musculoskeletal: Negative for back pain.  Skin: Negative  for rash.  Neurological: Positive for headaches.  Hematological: Does not bruise/bleed easily.  Psychiatric/Behavioral: Negative for confusion.    Allergies  Review of patient's allergies indicates no known allergies.  Home Medications   Current Outpatient Rx  Name  Route  Sig  Dispense  Refill  . BIOTIN PO   Oral   Take 1 tablet by mouth daily.         . calcium-vitamin D (OSCAL WITH D) 500-200 MG-UNIT per tablet   Oral   Take 1 tablet by mouth daily.         . Multiple Vitamin (MULTIVITAMIN WITH MINERALS) TABS   Oral   Take 1 tablet by mouth daily.         Marland Kitchen VITAMIN E PO   Oral   Take 1 tablet by mouth daily.         Marland Kitchen azithromycin (ZITHROMAX) 250 MG tablet   Oral   Take 1 tablet (250 mg total) by mouth daily. Take first 2 tablets together, then 1 every day until finished.   6 tablet   0     BP 124/75  Pulse 67  Temp(Src) 98.2 F (36.8 C) (Oral)  Resp 16  SpO2 97%  Physical Exam  Nursing note and vitals reviewed. Constitutional: She is oriented to person, place, and time. She appears well-developed and well-nourished. No distress.  HENT:  Head: Normocephalic and atraumatic.  Mouth/Throat: Oropharynx is clear and moist.  Eyes: Conjunctivae and EOM are normal.  Pupils are equal, round, and reactive to light.  Neck: Normal range of motion. Neck supple.  Cardiovascular: Normal rate and regular rhythm.   Murmur heard. Pulmonary/Chest: Effort normal and breath sounds normal. No respiratory distress. She has no wheezes. She has no rales.  Abdominal: Soft. Bowel sounds are normal. There is no tenderness.  Musculoskeletal: Normal range of motion. She exhibits no edema.  Neurological: She is alert and oriented to person, place, and time. No cranial nerve deficit. She exhibits normal muscle tone. Coordination normal.  Skin: Skin is warm. No rash noted.    ED Course  Procedures (including critical care time)  Labs Reviewed  COMPREHENSIVE METABOLIC PANEL -  Abnormal; Notable for the following:    Glucose, Bld 110 (*)    ALT 49 (*)    Alkaline Phosphatase 122 (*)    Total Bilirubin 0.2 (*)    GFR calc non Af Amer 82 (*)    All other components within normal limits  CBC WITH DIFFERENTIAL   Dg Chest 2 View  05/03/2013  *RADIOLOGY REPORT*  Clinical Data: Cough, shortness of breath and wheezing.  CHEST - 2 VIEW  Comparison: PA and lateral chest 11/15/2012 and CT chest 12/21/2011.  Findings: Lungs are clear.  Heart size is normal.  No pneumothorax or pleural effusion.  No focal bony abnormality.  The patient is status post cholecystectomy.  IMPRESSION: No acute disease.   Original Report Authenticated By: Holley Dexter, M.D.    Results for orders placed during the hospital encounter of 05/03/13  CBC WITH DIFFERENTIAL      Result Value Range   WBC 10.3  4.0 - 10.5 K/uL   RBC 4.37  3.87 - 5.11 MIL/uL   Hemoglobin 12.5  12.0 - 15.0 g/dL   HCT 65.7  84.6 - 96.2 %   MCV 83.3  78.0 - 100.0 fL   MCH 28.6  26.0 - 34.0 pg   MCHC 34.3  30.0 - 36.0 g/dL   RDW 95.2  84.1 - 32.4 %   Platelets 359  150 - 400 K/uL   Neutrophils Relative 54  43 - 77 %   Neutro Abs 5.6  1.7 - 7.7 K/uL   Lymphocytes Relative 39  12 - 46 %   Lymphs Abs 4.0  0.7 - 4.0 K/uL   Monocytes Relative 5  3 - 12 %   Monocytes Absolute 0.6  0.1 - 1.0 K/uL   Eosinophils Relative 2  0 - 5 %   Eosinophils Absolute 0.2  0.0 - 0.7 K/uL   Basophils Relative 0  0 - 1 %   Basophils Absolute 0.0  0.0 - 0.1 K/uL  COMPREHENSIVE METABOLIC PANEL      Result Value Range   Sodium 141  135 - 145 mEq/L   Potassium 4.3  3.5 - 5.1 mEq/L   Chloride 104  96 - 112 mEq/L   CO2 28  19 - 32 mEq/L   Glucose, Bld 110 (*) 70 - 99 mg/dL   BUN 21  6 - 23 mg/dL   Creatinine, Ser 4.01  0.50 - 1.10 mg/dL   Calcium 9.4  8.4 - 02.7 mg/dL   Total Protein 7.8  6.0 - 8.3 g/dL   Albumin 4.1  3.5 - 5.2 g/dL   AST 26  0 - 37 U/L   ALT 49 (*) 0 - 35 U/L   Alkaline Phosphatase 122 (*) 39 - 117 U/L   Total  Bilirubin 0.2 (*) 0.3 - 1.2 mg/dL  GFR calc non Af Amer 82 (*) >90 mL/min   GFR calc Af Amer >90  >90 mL/min     1. Bronchitis       MDM  Patient's workup negative for pneumonia or pneumothorax. Suspect this may be a bronchitis and has been persistent for at least 3 weeks. No evidence of any significant abnormalities based on labs. No significant leukocytosis. Patient's nontoxic no acute distress. Patient was improved with albuterol nebulizer treatment in the emergency department.        Shelda Jakes, MD 05/03/13 1610  Shelda Jakes, MD 05/03/13 [phone]

## 2013-08-28 ENCOUNTER — Emergency Department (INDEPENDENT_AMBULATORY_CARE_PROVIDER_SITE_OTHER)
Admission: EM | Admit: 2013-08-28 | Discharge: 2013-08-28 | Disposition: A | Discharge status: Home / Self Care | Payer: Self-pay | Source: Home / Self Care | Attending: Family Medicine | Admitting: Family Medicine

## 2013-08-28 ENCOUNTER — Encounter (HOSPITAL_COMMUNITY): Payer: Self-pay | Admitting: Emergency Medicine

## 2013-08-28 DIAGNOSIS — K529 Noninfective gastroenteritis and colitis, unspecified: Secondary | ICD-10-CM

## 2013-08-28 DIAGNOSIS — K5289 Other specified noninfective gastroenteritis and colitis: Secondary | ICD-10-CM

## 2013-08-28 LAB — POCT I-STAT, CHEM 8
Calcium, Ion: 1.17 mmol/L (ref 1.12–1.23)
Creatinine, Ser: 0.9 mg/dL (ref 0.50–1.10)
Glucose, Bld: 93 mg/dL (ref 70–99)
HCT: 41 % (ref 36.0–46.0)
Hemoglobin: 13.9 g/dL (ref 12.0–15.0)
TCO2: 23 mmol/L (ref 0–100)

## 2013-08-28 MED ORDER — ZOLPIDEM TARTRATE 10 MG PO TABS
10.0000 mg | ORAL_TABLET | Freq: Every evening | ORAL | Status: DC | PRN
Start: 2013-08-28 — End: 2015-11-04

## 2013-08-28 MED ORDER — PANTOPRAZOLE SODIUM 40 MG PO TBEC: 40.0000 mg | DELAYED_RELEASE_TABLET | Freq: Every day | ORAL | Status: DC

## 2013-08-28 MED ORDER — ONDANSETRON HCL 4 MG PO TABS: 4.0000 mg | ORAL_TABLET | Freq: Four times a day (QID) | ORAL | Status: DC

## 2013-08-28 MED ORDER — ONDANSETRON HCL 4 MG/2ML IJ SOLN
INTRAMUSCULAR | Status: AC
  Filled 2013-08-28: qty 2

## 2013-08-28 MED ORDER — ONDANSETRON HCL 4 MG/2ML IJ SOLN
4.0000 mg | Freq: Once | INTRAMUSCULAR | Status: AC
  Administered 2013-08-28: 4 mg via INTRAVENOUS

## 2013-08-28 MED ORDER — ONDANSETRON 4 MG PO TBDP
ORAL_TABLET | ORAL | Status: AC
  Filled 2013-08-28: qty 1

## 2013-08-28 MED ORDER — SODIUM CHLORIDE 0.9 % IV SOLN
Freq: Once | INTRAVENOUS | Status: AC
  Administered 2013-08-28: 15:00:00 via INTRAVENOUS

## 2013-08-28 MED ORDER — ONDANSETRON 4 MG PO TBDP
4.0000 mg | ORAL_TABLET | Freq: Once | ORAL | Status: AC
  Administered 2013-08-28: 4 mg via ORAL

## 2013-08-28 NOTE — ED Notes (Signed)
Patient is on phone

## 2013-08-28 NOTE — ED Notes (Signed)
Patient reports she thinks she has food poisoning.  Patient ate chinese food from her refrigerator, not sure how long it had been in there.  Reports issues with acid reflux

## 2013-08-28 NOTE — ED Notes (Signed)
Patient not ready for discharge-iv infusing, infusing without difficulty

## 2013-08-28 NOTE — ED Provider Notes (Signed)
CSN: [id number]     Arrival date & time 08/28/13  1408 History   First MD Initiated Contact with Patient 08/28/13 1508     Chief Complaint  Patient presents with  . Emesis   (Consider location/radiation/quality/duration/timing/severity/associated sxs/prior Treatment) Patient is a 57 y.o. female presenting with vomiting. The history is provided by the patient.  Emesis Severity:  Moderate Duration:  3 days Timing:  Intermittent Quality:  Stomach contents Progression:  Unchanged Chronicity:  New Context comment:  Onset after eating chinese food in refridg for unknown period Associated symptoms: diarrhea   Associated symptoms: no fever   Associated symptoms comment:  Dizziness and weak with standing. Risk factors: suspect food intake   Risk factors: no sick contacts     Past Medical History  Diagnosis Date  . Bronchitis    Past Surgical History  Procedure Laterality Date  . Cholecystectomy     No family history on file. History  Substance Use Topics  . Smoking status: Never Smoker   . Smokeless tobacco: Not on file  . Alcohol Use: No   OB History   Grav Para Term Preterm Abortions TAB SAB Ect Mult Living                 Review of Systems  Constitutional: Positive for appetite change.  Gastrointestinal: Positive for nausea, vomiting and diarrhea. Negative for blood in stool.  Genitourinary: Negative.   Neurological: Positive for dizziness and weakness.    Allergies  Review of patient's allergies indicates no known allergies.  Home Medications   Current Outpatient Rx  Name  Route  Sig  Dispense  Refill  . azithromycin (ZITHROMAX) 250 MG tablet   Oral   Take 1 tablet (250 mg total) by mouth daily. Take first 2 tablets together, then 1 every day until finished.   6 tablet   0   . BIOTIN PO   Oral   Take 1 tablet by mouth daily.         . calcium-vitamin D (OSCAL WITH D) 500-200 MG-UNIT per tablet   Oral   Take 1 tablet by mouth daily.         .  Multiple Vitamin (MULTIVITAMIN WITH MINERALS) TABS   Oral   Take 1 tablet by mouth daily.         . ondansetron (ZOFRAN) 4 MG tablet   Oral   Take 1 tablet (4 mg total) by mouth every 6 (six) hours. Prn n/v   10 tablet   0   . pantoprazole (PROTONIX) 40 MG tablet   Oral   Take 1 tablet (40 mg total) by mouth daily.   30 tablet   1   . VITAMIN E PO   Oral   Take 1 tablet by mouth daily.         Marland Kitchen zolpidem (AMBIEN) 10 MG tablet   Oral   Take 1 tablet (10 mg total) by mouth at bedtime as needed for sleep.   30 tablet   0    BP 105/66  Pulse 98  Temp(Src) 98.8 F (37.1 C) (Oral)  Resp 16  SpO2 97% Physical Exam  Nursing note and vitals reviewed. Constitutional: She is oriented to person, place, and time. She appears well-developed and well-nourished. No distress.  HENT:  Head: Normocephalic.  Right Ear: External ear normal.  Left Ear: External ear normal.  Mouth/Throat: Oropharynx is clear and moist.  Eyes: EOM are normal. Pupils are equal, round, and reactive to light.  Neck: Normal range of motion. Neck supple.  Cardiovascular: Regular rhythm.   Pulmonary/Chest: Breath sounds normal.  Abdominal: Soft. Bowel sounds are normal. She exhibits no distension and no mass. There is no tenderness. There is no rebound and no guarding.  Neurological: She is alert and oriented to person, place, and time.  Skin: Skin is warm and dry.    ED Course  Procedures (including critical care time) Labs Review Labs Reviewed  POCT I-STAT, CHEM 8   Imaging Review No results found.  MDM  Sx improved with ivf and meds.    Linna Hoff, MD 08/28/13 [phone]

## 2013-10-06 ENCOUNTER — Encounter (HOSPITAL_COMMUNITY): Payer: Self-pay | Admitting: Emergency Medicine

## 2013-10-06 ENCOUNTER — Emergency Department (HOSPITAL_COMMUNITY)
Admission: EM | Admit: 2013-10-06 | Discharge: 2013-10-06 | Disposition: A | Discharge status: Home / Self Care | Payer: PRIVATE HEALTH INSURANCE | Source: Home / Self Care | Attending: Family Medicine | Admitting: Family Medicine

## 2013-10-06 DIAGNOSIS — M7551 Bursitis of right shoulder: Secondary | ICD-10-CM

## 2013-10-06 DIAGNOSIS — M75100 Unspecified rotator cuff tear or rupture of unspecified shoulder, not specified as traumatic: Secondary | ICD-10-CM

## 2013-10-06 MED ORDER — HYDROCODONE-ACETAMINOPHEN 5-325 MG PO TABS: 1.0000 | ORAL_TABLET | Freq: Four times a day (QID) | ORAL | Status: DC | PRN

## 2013-10-06 MED ORDER — METHYLPREDNISOLONE ACETATE 40 MG/ML IJ SUSP: 40.0000 mg | Freq: Once | INTRAMUSCULAR | Status: DC

## 2013-10-06 MED ORDER — DICLOFENAC SODIUM 75 MG PO TBEC: 75.0000 mg | DELAYED_RELEASE_TABLET | Freq: Two times a day (BID) | ORAL | Status: DC

## 2013-10-06 MED ORDER — METHYLPREDNISOLONE ACETATE 40 MG/ML IJ SUSP
INTRAMUSCULAR | Status: AC
  Filled 2013-10-06: qty 5

## 2013-10-06 NOTE — ED Notes (Signed)
C/o pain and weakness in right arm. X 2 wks. 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 Denies injury.   No otc meds used for treatment.

## 2013-10-06 NOTE — ED Provider Notes (Signed)
Katherine Miller is a 57 y.o. female who presents to Urgent Care today for right shoulder pain for 2 weeks. Patient denies any injury. She has anterior lateral shoulder pain worse with overhand motion and at night and better with rest. The pain is moderate. She's not tried any medications yet. She denies any radiating pain weakness numbness fevers or chills. She works as a Lawyer.    Past Medical History  Diagnosis Date  . Bronchitis    History  Substance Use Topics  . Smoking status: Never Smoker   . Smokeless tobacco: Not on file  . Alcohol Use: No   ROS as above Medications reviewed. Current Facility-Administered Medications  Medication Dose Route Frequency Provider Last Rate Last Dose  . methylPREDNISolone acetate (DEPO-MEDROL) injection 40 mg  40 mg Intra-articular Once Rodolph Bong, MD       Current Outpatient Prescriptions  Medication Sig Dispense Refill  . azithromycin (ZITHROMAX) 250 MG tablet Take 1 tablet (250 mg total) by mouth daily. Take first 2 tablets together, then 1 every day until finished.  6 tablet  0  . BIOTIN PO Take 1 tablet by mouth daily.      . calcium-vitamin D (OSCAL WITH D) 500-200 MG-UNIT per tablet Take 1 tablet by mouth daily.      . diclofenac (VOLTAREN) 75 MG EC tablet Take 1 tablet (75 mg total) by mouth 2 (two) times daily.  60 tablet  0  . HYDROcodone-acetaminophen (NORCO/VICODIN) 5-325 MG per tablet Take 1 tablet by mouth every 6 (six) hours as needed for pain.  5 tablet  0  . Multiple Vitamin (MULTIVITAMIN WITH MINERALS) TABS Take 1 tablet by mouth daily.      . ondansetron (ZOFRAN) 4 MG tablet Take 1 tablet (4 mg total) by mouth every 6 (six) hours. Prn n/v  10 tablet  0  . pantoprazole (PROTONIX) 40 MG tablet Take 1 tablet (40 mg total) by mouth daily.  30 tablet  1  . VITAMIN E PO Take 1 tablet by mouth daily.      Marland Kitchen zolpidem (AMBIEN) 10 MG tablet Take 1 tablet (10 mg total) by mouth at bedtime as needed for sleep.  30 tablet  0    Exam:  BP  125/82  Pulse 69  Temp(Src) 98.6 F (37 C) (Oral)  Resp 14  SpO2 100% Gen: Well NAD NECK: Nontender spinal midline normal neck range of motion negative Spurling's test. Right shoulder: Normal-appearing nontender Pain with abduction ARC but full range of motion Normal external and internal rotation range of motion Strength 4+/5 right abduction normal otherwise. Positive impingement testing Left shoulder: Normal-appearing nontender full range of motion negative impingement testing normal strength.  Capillary refill sensation and grip strength is intact distally bilaterally.     Subacromial Injection: Right shoulder  Consent obtained and time out performed.  Area cleaned with alcohol.  40Mg  of depomedrol and 4ml of 0.5% marcaine was injected into the subacromial bursa without complication or bleeding. Patient tolerated the procedure well.    Assessment and Plan: 57 y.o. female with right shoulder subacromial bursitis versus rotator cuff tendinitis. Status post subacromial bursa injection.  Small amount of Norco given. Additionally use diclofenac for pain control. Work note provided. Followup with sports medicine if not improved Discussed warning signs or symptoms. Please see discharge instructions. Patient expresses understanding.      Rodolph Bong, MD 10/06/13 [phone]

## 2013-10-08 ENCOUNTER — Other Ambulatory Visit (INDEPENDENT_AMBULATORY_CARE_PROVIDER_SITE_OTHER): Payer: PRIVATE HEALTH INSURANCE

## 2013-10-08 ENCOUNTER — Other Ambulatory Visit: Payer: PRIVATE HEALTH INSURANCE

## 2013-10-08 ENCOUNTER — Ambulatory Visit: Payer: PRIVATE HEALTH INSURANCE | Admitting: Family Medicine

## 2013-10-08 ENCOUNTER — Encounter: Payer: Self-pay | Admitting: *Deleted

## 2013-10-08 ENCOUNTER — Encounter: Payer: Self-pay | Admitting: Family Medicine

## 2013-10-08 ENCOUNTER — Other Ambulatory Visit: Payer: Self-pay | Admitting: *Deleted

## 2013-10-08 ENCOUNTER — Ambulatory Visit (INDEPENDENT_AMBULATORY_CARE_PROVIDER_SITE_OTHER): Payer: PRIVATE HEALTH INSURANCE | Admitting: Family Medicine

## 2013-10-08 VITALS — BP 130/84 | HR 72 | Ht 66.0 in | Wt 174.0 lb

## 2013-10-08 DIAGNOSIS — M75100 Unspecified rotator cuff tear or rupture of unspecified shoulder, not specified as traumatic: Secondary | ICD-10-CM | POA: Insufficient documentation

## 2013-10-08 DIAGNOSIS — M25519 Pain in unspecified shoulder: Secondary | ICD-10-CM

## 2013-10-08 DIAGNOSIS — M75101 Unspecified rotator cuff tear or rupture of right shoulder, not specified as traumatic: Secondary | ICD-10-CM

## 2013-10-08 DIAGNOSIS — M25511 Pain in right shoulder: Secondary | ICD-10-CM

## 2013-10-08 DIAGNOSIS — S43429A Sprain of unspecified rotator cuff capsule, initial encounter: Secondary | ICD-10-CM

## 2013-10-08 MED ORDER — MELOXICAM 15 MG PO TABS: 15.0000 mg | ORAL_TABLET | Freq: Every day | ORAL | Status: DC

## 2013-10-08 MED ORDER — TRAMADOL HCL 50 MG PO TABS: 50.0000 mg | ORAL_TABLET | Freq: Every evening | ORAL | Status: DC | PRN

## 2013-10-08 NOTE — Patient Instructions (Signed)
Very nice to meet you We will put a 20# lifting restriction for next 2 weeks.  Do exercises daily Ice 20 minutes 2 times a day Capsacin topically can help Meloxicam daily for 10 days then as needed Tramadol at night.  Come back in 2 weeks.

## 2013-10-08 NOTE — Assessment & Plan Note (Signed)
Discussed diagnosis, prognosis and rehabilitation. Patient given meloxicam and tramadol for pain relief Discussed I did not want to do another injection this soon after prior injection. Patient given home exercise including theraband Icing protocol Work note stating no lifting greater than 20 pounds for the next 2 weeks Patient will return in 2 weeks. She continues to improve we will advance enough she can lift. Hopefully we'll be related to do a two-week full duty trial at that time Discussed nitroglycerin the patient states she has a history of migraines.

## 2013-10-08 NOTE — Addendum Note (Signed)
Addended by: Judi Saa on: 10/08/2013 04:50 PM   Modules accepted: Orders

## 2013-10-08 NOTE — Progress Notes (Signed)
I'm seeing this patient by the request  of:  Dr. Clementeen Graham  CC: Shoulder pain right-sided  HPI: Patient is a 57 year old right-hand-dominant female coming in with shoulder pain. Patient was seen previously 2 days ago at urgent care and did have a subacromial bursitis injection by Dr. Denyse Amass. Patient states she has had this pain for 2 weeks duration. Patient denies any true injury. Patient states that the pain seems to be mostly on the anterior lateral aspect of the shoulder that seems to be worse with overhead motion as well as reaching behind her back. She describes the pain as more of a moderate dull aching sensation he can have sharp pain with certain movements. Patient denies any radiation down the arm, any numbness or any significant weakness of the right extremity. Patient states that the injection Patient was also given a prescription for hydrocodone which has helped some.  Patient places the severity of 8/10  Past medical, surgical, family and social history reviewed. Medications reviewed all in the electronic medical record.   Review of Systems: No headache, visual changes, nausea, vomiting, diarrhea, constipation, dizziness, abdominal pain, skin rash, fevers, chills, night sweats, weight loss, swollen lymph nodes, body aches, joint swelling, muscle aches, chest pain, shortness of breath, mood changes.   Objective:    Blood pressure 130/84, pulse 72, weight 174 lb (78.926 kg), SpO2 98.00%.   General: No apparent distress alert and oriented x3 mood and affect normal, dressed appropriately.  HEENT: Pupils equal, extraocular movements intact Respiratory: Patient's speak in full sentences and does not appear short of breath Cardiovascular: No lower extremity edema, non tender, no erythema Skin: Warm dry intact with no signs of infection or rash on extremities or on axial skeleton. Abdomen: Soft nontender Neuro: Cranial nerves II through XII are intact, neurovascularly intact in all  extremities with 2+ DTRs and 2+ pulses. Lymph: No lymphadenopathy of posterior or anterior cervical chain or axillae bilaterally.  Gait normal with good balance and coordination.  MSK: Non tender with full range of motion and good stability and symmetric strength and tone of elbows, wrist, hip, knee and ankles bilaterally.   Shoulder: Right Inspection reveals no abnormalities, atrophy or asymmetry. Palpation is normal with no tenderness over AC joint or bicipital groove. ROM is restricted in forward flexion 260 actively but full passively. Patient has internal rotation to lateral hip. External rotation of 30. Rotator cuff strength normal throughout. No signs of impingement with negative Neer and Hawkin's tests, empty can sign. Speeds and Yergason's tests normal. No labral pathology noted with negative Obrien's, negative clunk and good stability. Normal scapular function observed. No painful arc and no drop arm sign. No apprehension sign Contralateral shoulder unremarkable  MSK US performed of: right shoulder This study was ordered, performed, and interpreted by Terrilee Files D.O.  Shoulder:  right Supraspinatus: Does have a bursal bulge as well as what appears to be a very small partial-thickness tear of approximately 10-25% of the tendon. There is no retraction. Significant Doppler flow in the area.  Infraspinatus:  Appears normal on long and transverse views. Subscapularis:  Mild hypoechoic changes the tendon is unremarkable Teres Minor:  Appears normal on long and transverse views. AC joint:  Capsule is distended, mild geyser sign. Glenohumeral Joint:  Appears normal without effusion. Glenoid Labrum:  Intact without visualized tears. Biceps Tendon:  Appears normal on long and transverse views, no fraying of tendon, tendon located in intertubercular groove, no subluxation with shoulder internal or external rotation. No increased  power doppler signal.     Impression and  Recommendations:     This case required medical decision making of moderate complexity.

## 2013-10-15 ENCOUNTER — Encounter: Payer: Self-pay | Admitting: *Deleted

## 2013-10-15 ENCOUNTER — Telehealth: Payer: Self-pay | Admitting: Family Medicine

## 2013-10-15 ENCOUNTER — Ambulatory Visit (INDEPENDENT_AMBULATORY_CARE_PROVIDER_SITE_OTHER)
Admission: RE | Admit: 2013-10-15 | Discharge: 2013-10-15 | Disposition: A | Discharge status: Home / Self Care | Payer: PRIVATE HEALTH INSURANCE | Source: Ambulatory Visit | Attending: Family Medicine | Admitting: Family Medicine

## 2013-10-15 ENCOUNTER — Ambulatory Visit (INDEPENDENT_AMBULATORY_CARE_PROVIDER_SITE_OTHER): Payer: PRIVATE HEALTH INSURANCE | Admitting: Family Medicine

## 2013-10-15 ENCOUNTER — Encounter: Payer: Self-pay | Admitting: Family Medicine

## 2013-10-15 VITALS — BP 124/80 | HR 63

## 2013-10-15 DIAGNOSIS — S43429A Sprain of unspecified rotator cuff capsule, initial encounter: Secondary | ICD-10-CM

## 2013-10-15 DIAGNOSIS — M19011 Primary osteoarthritis, right shoulder: Secondary | ICD-10-CM

## 2013-10-15 DIAGNOSIS — M75101 Unspecified rotator cuff tear or rupture of right shoulder, not specified as traumatic: Secondary | ICD-10-CM

## 2013-10-15 DIAGNOSIS — M19019 Primary osteoarthritis, unspecified shoulder: Secondary | ICD-10-CM

## 2013-10-15 NOTE — Assessment & Plan Note (Signed)
Patient's will continue with the exercise at this time. We'll call him Monday and is continuing to have pain we will order an MRI.

## 2013-10-15 NOTE — Telephone Encounter (Signed)
Patient is very concerned about her work as well as her short-term disability. She was told by an individual at her job that she may not get the benefit. I told her at this time I do not believe she can do full duty at her work but she is able to do light duty. If her employer is unable to do this she should not be penalized for this and short-term disability should be able to come into affect.  Patient will call on Monday him to me how her shoulder is doing. If he completely resolves I will he more than willing to write her a note stating that like her to do a 2 week trial of regular duty at that time. If not we will consider doing an MRI the

## 2013-10-15 NOTE — Telephone Encounter (Signed)
10/15/2013  Pt just had appt with Dr. Katrinka Blazing, but is back in the lobby wanting to speak with Dr. Katrinka Blazing about the paper work she just received.  She is requesting to speak with Dr. Katrinka Blazing only.  Pt is waiting in lobby.

## 2013-10-15 NOTE — Progress Notes (Signed)
  CC: Shoulder pain right-sided follow up  HPI: Patient is a 57 year old right-hand-dominant female coming in with shoulder pain. Patient was diagnosed with a partial-thickness tear of the supraspinatus and rotator cuff tendinopathy at last visit. Patient states that she is somewhat better. She still having some mild radiation down the arm. She states that Sunday she can have good days and bad nights and then someday she has bad nights in the day. Patient denies any new symptoms. Patient still denies any neck pain. Patient has been doing some of the exercises but not on a regular basis. Patient has been out of work since last visit.   Past medical, surgical, family and social history reviewed. Medications reviewed all in the electronic medical record.   Review of Systems: No headache, visual changes, nausea, vomiting, diarrhea, constipation, dizziness, abdominal pain, skin rash, fevers, chills, night sweats, weight loss, swollen lymph nodes, body aches, joint swelling, muscle aches, chest pain, shortness of breath, mood changes.   Objective:    Blood pressure 124/80, pulse 63, SpO2 98.00%.   General: No apparent distress alert and oriented x3 mood and affect normal, dressed appropriately.  HEENT: Pupils equal, extraocular movements intact Respiratory: Patient's speak in full sentences and does not appear short of breath Cardiovascular: No lower extremity edema, non tender, no erythema Skin: Warm dry intact with no signs of infection or rash on extremities or on axial skeleton. Abdomen: Soft nontender Neuro: Cranial nerves II through XII are intact, neurovascularly intact in all extremities with 2+ DTRs and 2+ pulses. Lymph: No lymphadenopathy of posterior or anterior cervical chain or axillae bilaterally.  Gait normal with good balance and coordination.  MSK: Non tender with full range of motion and good stability and symmetric strength and tone of elbows, wrist, hip, knee and ankles  bilaterally.   Shoulder: Right Inspection reveals no abnormalities, atrophy or asymmetry. Palpation i tenderness over AC joint but not  bicipital groove. ROM  is improved from previous visit Rotator cuff strength normal throughout. No signs of impingement with negative Neer and Hawkin's tests, empty can sign. Speeds and Yergason's tests normal. No labral pathology noted with negative Obrien's, negative clunk and good stability. Positive crossover  Normal scapular function observed. No painful arc and no drop arm sign. No apprehension sign Contralateral shoulder unremarkable  MSK US performed of: right shoulder This study was ordered, performed, and interpreted by Terrilee Files D.O.   Procedure: Real-time Ultrasound Guided Injection of right acromioclavicular joint  Device: GE Logiq E  Ultrasound guided injection is preferred based studies that show increased duration, increased effect, greater accuracy, decreased procedural pain, increased response rate with ultrasound guided versus blind injection.  Verbal informed consent obtained.  Time-out conducted.  Noted no overlying erythema, induration, or other signs of local infection.  Skin prepped in a sterile fashion.  Local anesthesia: Topical Ethyl chloride.  With sterile technique and under real time ultrasound guidance:  Joint visualized.  23g 1  inch needle inserted superior approach. Pictures taken for needle placement. Patient did have injection of 1 cc of 0.5% Marcaine, and 1.0 cc of Kenalog 40 mg/dL. Completed without difficulty  Pain immediately resolved suggesting accurate placement of the medication.  Advised to call if fevers/chills, erythema, induration, drainage, or persistent bleeding.  Images permanently stored and available for review in the ultrasound unit.  Impression: Technically successful ultrasound guided injection.    Impression and Recommendations:     This case required medical decision making of moderate  complexity.

## 2013-10-15 NOTE — Patient Instructions (Signed)
Very good to see you  I am hoping this will be helpful with the shot.  Get xray downstairs today On Monday give me a call and tell me how you are doing.  Keep doing the exercises.  I will see you next week.

## 2013-10-15 NOTE — Assessment & Plan Note (Signed)
We did an injection for diagnostic as well as therapeutic purposes. After the injection patient did have significant decrease in pain but is still very hesitant to be optimistic. There seems to be some other factors going on that is keeping patient from work. At this time regarding her for 72 hours and she will call at that time. If she continues to have pain I would like to order an MRI. X-ray ordered today. Patient will come back in one week for followup the matter shoes pain-free her not to get her back to work. Note given today

## 2013-10-19 ENCOUNTER — Telehealth: Payer: Self-pay | Admitting: Family Medicine

## 2013-10-19 DIAGNOSIS — M75101 Unspecified rotator cuff tear or rupture of right shoulder, not specified as traumatic: Secondary | ICD-10-CM

## 2013-10-19 NOTE — Telephone Encounter (Signed)
10/19/2013   Pt left message for Dr. Katrinka Blazing in regards to previous appt; pt is still in pain.  Please contact @ [phone]

## 2013-10-19 NOTE — Telephone Encounter (Signed)
Discussed with pt, i advised her the next step will be an MRI, pt understood.  Orders entered.

## 2013-10-20 ENCOUNTER — Telehealth: Payer: Self-pay

## 2013-10-20 MED ORDER — HYDROXYZINE HCL 25 MG PO TABS: 25.0000 mg | ORAL_TABLET | Freq: Three times a day (TID) | ORAL | Status: DC | PRN

## 2013-10-20 NOTE — Telephone Encounter (Signed)
Patient called Triage line requesting that she have prescription for Hydroxyzine 25 mg (green tabs). Patient stated that she has been on this medication on and off for about 20 years for her anxiety. She has an MRI coming up Friday and she would like it filled.   Please advise,   Thank you!

## 2013-10-20 NOTE — Telephone Encounter (Signed)
Call patient and told her I would refill her hydroxyzine for as needed for anxiety but the MRI coming up. Medications sent in.

## 2013-10-23 ENCOUNTER — Ambulatory Visit
Admission: RE | Admit: 2013-10-23 | Discharge: 2013-10-23 | Disposition: A | Discharge status: Home / Self Care | Payer: PRIVATE HEALTH INSURANCE | Source: Ambulatory Visit | Attending: Family Medicine | Admitting: Family Medicine

## 2013-10-23 ENCOUNTER — Telehealth: Payer: Self-pay | Admitting: Family Medicine

## 2013-10-23 ENCOUNTER — Encounter: Payer: Self-pay | Admitting: Family Medicine

## 2013-10-23 DIAGNOSIS — M75101 Unspecified rotator cuff tear or rupture of right shoulder, not specified as traumatic: Secondary | ICD-10-CM

## 2013-10-23 NOTE — Telephone Encounter (Signed)
10/23/2013  Pt left message for Dr Katrinka Blazing to write her a letter stating her restrictions / no restrictions for work.  Would like to pick up today.    10/23/2013  Jody from [employer] also left message regarding pt.  She needs to confirm any restrictions for pt.  Pls contact [phone] ext [phone]; ref # [id number]

## 2013-10-23 NOTE — Telephone Encounter (Signed)
Note provided to pt by Dr. Katrinka Blazing

## 2013-11-18 ENCOUNTER — Encounter: Payer: Self-pay | Admitting: Family Medicine

## 2013-12-28 ENCOUNTER — Encounter (HOSPITAL_COMMUNITY): Payer: Self-pay | Admitting: Emergency Medicine

## 2013-12-28 ENCOUNTER — Emergency Department (INDEPENDENT_AMBULATORY_CARE_PROVIDER_SITE_OTHER)
Admission: EM | Admit: 2013-12-28 | Discharge: 2013-12-28 | Disposition: A | Discharge status: Home / Self Care | Payer: PRIVATE HEALTH INSURANCE | Source: Home / Self Care | Attending: Emergency Medicine | Admitting: Emergency Medicine

## 2013-12-28 ENCOUNTER — Encounter: Payer: Self-pay | Admitting: Obstetrics & Gynecology

## 2013-12-28 ENCOUNTER — Emergency Department (INDEPENDENT_AMBULATORY_CARE_PROVIDER_SITE_OTHER): Payer: PRIVATE HEALTH INSURANCE

## 2013-12-28 DIAGNOSIS — J40 Bronchitis, not specified as acute or chronic: Secondary | ICD-10-CM

## 2013-12-28 MED ORDER — HYDROXYZINE HCL 25 MG PO TABS: 25.0000 mg | ORAL_TABLET | Freq: Three times a day (TID) | ORAL | Status: DC | PRN

## 2013-12-28 MED ORDER — AZITHROMYCIN 250 MG PO TABS: 250.0000 mg | ORAL_TABLET | Freq: Every day | ORAL | Status: DC

## 2013-12-28 NOTE — ED Provider Notes (Signed)
CSN: [id number]     Arrival date & time 12/28/13  1044 History   First MD Initiated Contact with Patient 12/28/13 1229     Chief Complaint  Patient presents with  . URI   (Consider location/radiation/quality/duration/timing/severity/associated sxs/prior Treatment) Patient is a 58 y.o. female presenting with cough. The history is provided by the patient. No language interpreter was used.  Cough Cough characteristics:  Productive Sputum characteristics:  Nondescript Severity:  Moderate Onset quality:  Gradual Duration:  1 week Timing:  Constant Progression:  Worsening Chronicity:  New Smoker: no   Relieved by:  Nothing Worsened by:  Nothing tried Associated symptoms: chest pain, fever and sore throat     Past Medical History  Diagnosis Date  . Bronchitis    Past Surgical History  Procedure Laterality Date  . Cholecystectomy     No family history on file. History  Substance Use Topics  . Smoking status: Never Smoker   . Smokeless tobacco: Not on file  . Alcohol Use: No   OB History   Grav Para Term Preterm Abortions TAB SAB Ect Mult Living                 Review of Systems  Constitutional: Positive for fever.  HENT: Positive for sore throat.   Respiratory: Positive for cough.   Cardiovascular: Positive for chest pain.  All other systems reviewed and are negative.    Allergies  Review of patient's allergies indicates no known allergies.  Home Medications   Current Outpatient Rx  Name  Route  Sig  Dispense  Refill  . OVER THE COUNTER MEDICATION      Store brand items for cold and mucus         . azithromycin (ZITHROMAX) 250 MG tablet   Oral   Take 1 tablet (250 mg total) by mouth daily. Take first 2 tablets together, then 1 every day until finished.   6 tablet   0   . BIOTIN PO   Oral   Take 1 tablet by mouth daily.         . calcium-vitamin D (OSCAL WITH D) 500-200 MG-UNIT per tablet   Oral   Take 1 tablet by mouth daily.         Marland Kitchen  HYDROcodone-acetaminophen (NORCO/VICODIN) 5-325 MG per tablet   Oral   Take 1 tablet by mouth every 6 (six) hours as needed for pain.   5 tablet   0   . hydrOXYzine (ATARAX/VISTARIL) 25 MG tablet   Oral   Take 1 tablet (25 mg total) by mouth every 8 (eight) hours as needed for anxiety.   20 tablet   0   . meloxicam (MOBIC) 15 MG tablet   Oral   Take 1 tablet (15 mg total) by mouth daily.   30 tablet   0   . Multiple Vitamin (MULTIVITAMIN WITH MINERALS) TABS   Oral   Take 1 tablet by mouth daily.         . ondansetron (ZOFRAN) 4 MG tablet   Oral   Take 1 tablet (4 mg total) by mouth every 6 (six) hours. Prn n/v   10 tablet   0   . pantoprazole (PROTONIX) 40 MG tablet   Oral   Take 1 tablet (40 mg total) by mouth daily.   30 tablet   1   . traMADol (ULTRAM) 50 MG tablet   Oral   Take 1 tablet (50 mg total) by mouth at bedtime as  needed for pain.   30 tablet   0   . VITAMIN E PO   Oral   Take 1 tablet by mouth daily.         Marland Kitchen zolpidem (AMBIEN) 10 MG tablet   Oral   Take 1 tablet (10 mg total) by mouth at bedtime as needed for sleep.   30 tablet   0    BP 125/65  Pulse 72  Temp(Src) 98.6 F (37 C) (Oral)  Resp 16  SpO2 100% Physical Exam  Constitutional: She appears well-developed and well-nourished.  HENT:  Head: Normocephalic.  Right Ear: External ear normal.  Left Ear: External ear normal.  Nose: Nose normal.  Mouth/Throat: Oropharynx is clear and moist.  Eyes: Conjunctivae are normal. Pupils are equal, round, and reactive to light.  Neck: Normal range of motion. Neck supple.  Cardiovascular: Normal rate and normal heart sounds.   Pulmonary/Chest: Effort normal and breath sounds normal.  Abdominal: Soft.  Musculoskeletal: Normal range of motion.  Neurological: She is alert.  Skin: Skin is warm.  Psychiatric: She has a normal mood and affect.    ED Course  Procedures (including critical care time) Labs Review Labs Reviewed - No data to  display Imaging Review Dg Chest 2 View  12/28/2013   CLINICAL DATA:  Chest pain  EXAM: CHEST  2 VIEW  COMPARISON:  Prior radiograph from 05/03/2013  FINDINGS: The cardiac and mediastinal silhouettes are within normal limits.  The lungs are normally inflated. No airspace consolidation, pleural effusion, or pulmonary edema is identified. There is no pneumothorax.  No acute osseous abnormality identified. Cholecystectomy clips overlie the upper abdomen.  IMPRESSION: No active cardiopulmonary disease.   Electronically Signed   By: Jeannine Boga M.D.   On: 12/28/2013 13:45    EKG Interpretation    Date/Time:    Ventricular Rate:    PR Interval:    QRS Duration:   QT Interval:    QTC Calculation:   R Axis:     Text Interpretation:              MDM   1. Bronchitis    rx for zithromax     Fransico Meadow, PA-C 12/28/13 1430

## 2013-12-28 NOTE — Discharge Instructions (Signed)

## 2013-12-28 NOTE — ED Notes (Signed)
Patient was not undressed and needs to use the bathroom.

## 2013-12-28 NOTE — ED Provider Notes (Signed)
Medical screening examination/treatment/procedure(s) were performed by non-physician practitioner and as supervising physician I was immediately available for consultation/collaboration.  Philipp Deputy, M.D.  Harden Mo, MD 12/28/13 [phone]

## 2013-12-28 NOTE — ED Notes (Signed)
Dizzy, very tired, chest soreness, headaches, constantly cold.  Onset last week of symptoms.  Patient has heard wheezing in chest, eyes aching and significant head pain

## 2014-01-12 ENCOUNTER — Encounter: Payer: Self-pay | Admitting: Family Medicine

## 2014-01-12 ENCOUNTER — Ambulatory Visit (INDEPENDENT_AMBULATORY_CARE_PROVIDER_SITE_OTHER): Payer: PRIVATE HEALTH INSURANCE | Admitting: Family Medicine

## 2014-01-12 ENCOUNTER — Ambulatory Visit (INDEPENDENT_AMBULATORY_CARE_PROVIDER_SITE_OTHER)
Admission: RE | Admit: 2014-01-12 | Discharge: 2014-01-12 | Disposition: A | Discharge status: Home / Self Care | Payer: PRIVATE HEALTH INSURANCE | Source: Ambulatory Visit | Attending: Family Medicine | Admitting: Family Medicine

## 2014-01-12 VITALS — BP 120/76 | HR 81 | Temp 98.4°F | Resp 16 | Wt 172.1 lb

## 2014-01-12 DIAGNOSIS — M542 Cervicalgia: Secondary | ICD-10-CM

## 2014-01-12 MED ORDER — MELOXICAM 15 MG PO TABS: 15.0000 mg | ORAL_TABLET | Freq: Every day | ORAL | Status: DC

## 2014-01-12 MED ORDER — GABAPENTIN 300 MG PO CAPS: ORAL_CAPSULE | ORAL | Status: DC

## 2014-01-12 MED ORDER — KETOROLAC TROMETHAMINE 60 MG/2ML IM SOLN
60.0000 mg | Freq: Once | INTRAMUSCULAR | Status: AC
  Administered 2014-01-12: 60 mg via INTRAMUSCULAR

## 2014-01-12 MED ORDER — PREDNISONE 50 MG PO TABS: 50.0000 mg | ORAL_TABLET | Freq: Every day | ORAL | Status: DC

## 2014-01-12 NOTE — Progress Notes (Signed)
Pre-visit discussion using our clinic review tool. No additional management support is needed unless otherwise documented below in the visit note.  

## 2014-01-12 NOTE — Patient Instructions (Signed)
Good to see you Get xrays downstairs.  gabapentin at night. Will make you sleepy.  Meloxicam daily for 10 days then as needed.  Prednisone daily for 5 days.  Start exercsies most day sof the week in 2 days.  COme back again in 2 weeks.

## 2014-01-12 NOTE — Assessment & Plan Note (Signed)
Patient's clinical exam is concerning for a potential C5 nerve root impingement. Patient denied any great improvement with treat her shoulder previously.  Patient was given home exercise program, occasions per orders, and will try these interventions for the next 3 weeks.  X-rays were ordered by me today as well as reviewed. Patient's x-rays do show a potential osteophyte formation on the right side of the C5 vertebra. If this continues to give her trouble I would consider getting an MRI.

## 2014-01-12 NOTE — Progress Notes (Signed)
  CC: Shoulder pain right-sided follow up  HPI: Patient is a 58 year old right-hand-dominant female for recurrent shoulder pain. 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 Patient to return to work 3 months ago on a regular basis. Patient states That her shoulder pain has returned. Patient states it is worse than ever. Patient is having weakness as well. Patient states that this is going down her arm towards her elbow as well. Patient denies though that is stopping her from any activities. Patient continues to work but is finding it hard to do all her activities. Patient states that she did have some nighttime awakening as well. Patient severity is 8/10.  Patient's MRI of the right shoulder showed a type III acromion otherwise fairly unremarkable  Past medical, surgical, family and social history reviewed. Medications reviewed all in the electronic medical record.   Review of Systems: No headache, visual changes, nausea, vomiting, diarrhea, constipation, dizziness, abdominal pain, skin rash, fevers, chills, night sweats, weight loss, swollen lymph nodes, body aches, joint swelling, muscle aches, chest pain, shortness of breath, mood changes.   Objective:    Blood pressure 120/76, pulse 81, temperature 98.4 F (36.9 C), temperature source Oral, resp. rate 16, weight 172 lb 1.9 oz (78.073 kg), SpO2 97.00%.   General: No apparent distress alert and oriented x3 mood and affect normal, dressed appropriately.  HEENT: Pupils equal, extraocular movements intact Respiratory: Patient's speak in full sentences and does not appear short of breath Cardiovascular: No lower extremity edema, non tender, no erythema Skin: Warm dry intact with no signs of infection or rash on extremities or on axial skeleton. Abdomen: Soft nontender Neuro: Cranial nerves II through XII are intact, neurovascularly intact in all extremities with 2+ DTRs and 2+ pulses. Lymph: No  lymphadenopathy of posterior or anterior cervical chain or axillae bilaterally.  Gait normal with good balance and coordination.  MSK: Non tender with full range of motion and good stability and symmetric strength and tone of elbows, wrist, hip, knee and ankles bilaterally.   Shoulder: Right Inspection reveals no abnormalities, atrophy or asymmetry. Palpation i tenderness over AC joint but not  bicipital groove. ROM  is improved from previous visit Rotator cuff strength normal throughout. Positive signs of impingement with positive Neer and Hawkin's tests, empty can sign. Speeds and Yergason's tests normal. No labral pathology noted with negative Obrien's, negative clunk and good stability. Positive crossover  Normal scapular function observed. No painful arc and no drop arm sign. No apprehension sign Contralateral shoulder unremarkable  Neck: Inspection unremarkable. No palpable stepoffs. Positive Spurling's maneuver with radiculopathy Full neck range of motion Grip strength and sensation normal in bilateral hands Strength good C4 to T1 distribution No sensory change to C4 to T1 Negative Hoffman sign bilaterally Reflexes normal   Impression and Recommendations:     This case required medical decision making of moderate complexity.

## 2014-01-20 ENCOUNTER — Telehealth: Payer: Self-pay | Admitting: *Deleted

## 2014-01-20 NOTE — Telephone Encounter (Signed)
Xray relatively normal, mild arthritis.  If concern or continued pain we would be looking at another MRI but hopefully she is improving.

## 2014-01-20 NOTE — Telephone Encounter (Signed)
Patient left vm requesting x-ray results. Called patient back to her and let her know the x-rays have not been resulted. Informed patient that she will receive a call when results are ready.

## 2014-01-21 ENCOUNTER — Telehealth: Payer: Self-pay | Admitting: *Deleted

## 2014-01-21 NOTE — Telephone Encounter (Signed)
Left message for pt to return call.

## 2014-01-21 NOTE — Telephone Encounter (Signed)
Spoke with pt, advised of MDs message.  She states she will call later to schedule a follow up appt.

## 2014-03-24 ENCOUNTER — Ambulatory Visit: Payer: PRIVATE HEALTH INSURANCE | Admitting: Family Medicine

## 2014-03-25 ENCOUNTER — Ambulatory Visit (INDEPENDENT_AMBULATORY_CARE_PROVIDER_SITE_OTHER): Payer: PRIVATE HEALTH INSURANCE | Admitting: Family Medicine

## 2014-03-25 ENCOUNTER — Encounter: Payer: Self-pay | Admitting: Family Medicine

## 2014-03-25 ENCOUNTER — Other Ambulatory Visit (INDEPENDENT_AMBULATORY_CARE_PROVIDER_SITE_OTHER): Payer: PRIVATE HEALTH INSURANCE

## 2014-03-25 VITALS — BP 128/76 | HR 80

## 2014-03-25 DIAGNOSIS — M25519 Pain in unspecified shoulder: Secondary | ICD-10-CM

## 2014-03-25 DIAGNOSIS — M25511 Pain in right shoulder: Secondary | ICD-10-CM

## 2014-03-25 DIAGNOSIS — M75100 Unspecified rotator cuff tear or rupture of unspecified shoulder, not specified as traumatic: Secondary | ICD-10-CM

## 2014-03-25 DIAGNOSIS — S43429A Sprain of unspecified rotator cuff capsule, initial encounter: Secondary | ICD-10-CM

## 2014-03-25 MED ORDER — PANTOPRAZOLE SODIUM 40 MG PO TBEC: 40.0000 mg | DELAYED_RELEASE_TABLET | Freq: Every day | ORAL | Status: DC

## 2014-03-25 NOTE — Assessment & Plan Note (Addendum)
Patient responded very well to injection before and will try again. 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 Patient given home exercises again encouraged to do them a regular basis. We discussed the importance of icing. Medications per orders. Patient will return again and 34 weeks.

## 2014-03-25 NOTE — Progress Notes (Signed)
CC: Shoulder pain right-sided follow up  HPI: Patient is a 58 year old right-hand-dominant female for recurrent shoulder pain. 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 The patient has not been seen for almost 3 months. Patient had been doing fairly well and states though over the course of the last 2 weeks the pain started to return. Patient states that it does make it difficult to work. Patient states at the end of the long day she has significant soreness that can keep her up at night. Patient states that the pain seems to be better in the morning. Denies any neck pain associated with it now. Patient states that it the underlying day she can have some weakness. Denies any new symptoms the   Patient's MRI of the right shoulder showed a type III acromion otherwise fairly unremarkable  Past medical, surgical, family and social history reviewed. Medications reviewed all in the electronic medical record.   Review of Systems: No headache, visual changes, nausea, vomiting, diarrhea, constipation, dizziness, abdominal pain, skin rash, fevers, chills, night sweats, weight loss, swollen lymph nodes, body aches, joint swelling, muscle aches, chest pain, shortness of breath, mood changes.   Objective:    Blood pressure 128/76, pulse 80, SpO2 98.00%.   General: No apparent distress alert and oriented x3 mood and affect normal, dressed appropriately.  HEENT: Pupils equal, extraocular movements intact Respiratory: Patient's speak in full sentences and does not appear short of breath Cardiovascular: No lower extremity edema, non tender, no erythema Skin: Warm dry intact with no signs of infection or rash on extremities or on axial skeleton. Abdomen: Soft nontender Neuro: Cranial nerves II through XII are intact, neurovascularly intact in all extremities with 2+ DTRs and 2+ pulses. Lymph: No lymphadenopathy of posterior or anterior cervical chain or  axillae bilaterally.  Gait normal with good balance and coordination.  MSK: Non tender with full range of motion and good stability and symmetric strength and tone of elbows, wrist, hip, knee and ankles bilaterally.   Shoulder: Right Inspection reveals no abnormalities, atrophy or asymmetry. Palpation i tenderness over AC joint but not  bicipital groove. ROM  is improved from previous visit Rotator cuff strength normal throughout. Positive signs of impingement with positive Neer and Hawkin's tests, empty can sign. Speeds and Yergason's tests normal. No labral pathology noted with negative Obrien's, negative clunk and good stability. Positive crossover  Normal scapular function observed. No painful arc and no drop arm sign. No apprehension sign Contralateral shoulder unremarkable  Neck: Inspection unremarkable. No palpable stepoffs. Positive Spurling's maneuver with radiculopathy Full neck range of motion Grip strength and sensation normal in bilateral hands Strength good C4 to T1 distribution No sensory change to C4 to T1 Negative Hoffman sign bilaterally Reflexes normal  Procedure: Real-time Ultrasound Guided Injection of right glenohumeral joint Device: GE Logiq E  Ultrasound guided injection is preferred based studies that show increased duration, increased effect, greater accuracy, decreased procedural pain, increased response rate with ultrasound guided versus blind injection.  Verbal informed consent obtained.  Time-out conducted.  Noted no overlying erythema, induration, or other signs of local infection.  Skin prepped in a sterile fashion.  Local anesthesia: Topical Ethyl chloride.  With sterile technique and under real time ultrasound guidance:  Joint visualized.  23g 1  inch needle inserted posterior approach. Pictures taken for needle placement. Patient did have injection of 2 cc of 1% lidocaine, 2 cc of 0.5% Marcaine, and 1.0 cc of Kenalog  40 mg/dL. Completed without  difficulty  Pain immediately resolved suggesting accurate placement of the medication.  Advised to call if fevers/chills, erythema, induration, drainage, or persistent bleeding.  Images permanently stored and available for review in the ultrasound unit.  Impression: Technically successful ultrasound guided injection.    Impression and Recommendations:     This case required medical decision making of moderate complexity.

## 2014-03-25 NOTE — Patient Instructions (Addendum)
Good to see you Continue icing for now 20 minutes 2 times a day.  Take medicine 3 times a day for next 3 days or 6 days if still having pain.  Start gabapentin before bed for next 2 weeks at least.  Come back when you need me.

## 2014-04-17 ENCOUNTER — Emergency Department (HOSPITAL_COMMUNITY)
Admission: EM | Admit: 2014-04-17 | Discharge: 2014-04-17 | Disposition: A | Discharge status: Home / Self Care | Payer: PRIVATE HEALTH INSURANCE | Attending: Emergency Medicine | Admitting: Emergency Medicine

## 2014-04-17 ENCOUNTER — Emergency Department (INDEPENDENT_AMBULATORY_CARE_PROVIDER_SITE_OTHER)
Admission: EM | Admit: 2014-04-17 | Discharge: 2014-04-17 | Disposition: A | Discharge status: Hospice | Payer: PRIVATE HEALTH INSURANCE | Source: Home / Self Care | Attending: Family Medicine | Admitting: Family Medicine

## 2014-04-17 ENCOUNTER — Encounter (HOSPITAL_COMMUNITY): Payer: Self-pay | Admitting: Emergency Medicine

## 2014-04-17 ENCOUNTER — Emergency Department (HOSPITAL_COMMUNITY): Payer: PRIVATE HEALTH INSURANCE

## 2014-04-17 DIAGNOSIS — R0602 Shortness of breath: Secondary | ICD-10-CM

## 2014-04-17 DIAGNOSIS — Z792 Long term (current) use of antibiotics: Secondary | ICD-10-CM | POA: Insufficient documentation

## 2014-04-17 DIAGNOSIS — Z8709 Personal history of other diseases of the respiratory system: Secondary | ICD-10-CM | POA: Insufficient documentation

## 2014-04-17 DIAGNOSIS — Z791 Long term (current) use of non-steroidal anti-inflammatories (NSAID): Secondary | ICD-10-CM | POA: Insufficient documentation

## 2014-04-17 DIAGNOSIS — R55 Syncope and collapse: Secondary | ICD-10-CM | POA: Insufficient documentation

## 2014-04-17 DIAGNOSIS — Z79899 Other long term (current) drug therapy: Secondary | ICD-10-CM | POA: Insufficient documentation

## 2014-04-17 LAB — CBC WITH DIFFERENTIAL/PLATELET
BASOS ABS: 0 10*3/uL (ref 0.0–0.1)
BASOS PCT: 0 % (ref 0–1)
EOS ABS: 0.1 10*3/uL (ref 0.0–0.7)
Eosinophils Relative: 1 % (ref 0–5)
HEMATOCRIT: 35.1 % — AB (ref 36.0–46.0)
Hemoglobin: 11.8 g/dL — ABNORMAL LOW (ref 12.0–15.0)
Lymphocytes Relative: 48 % — ABNORMAL HIGH (ref 12–46)
Lymphs Abs: 2.7 10*3/uL (ref 0.7–4.0)
MCH: 29.1 pg (ref 26.0–34.0)
MCHC: 33.6 g/dL (ref 30.0–36.0)
MCV: 86.7 fL (ref 78.0–100.0)
MONO ABS: 0.3 10*3/uL (ref 0.1–1.0)
Monocytes Relative: 6 % (ref 3–12)
Neutro Abs: 2.6 10*3/uL (ref 1.7–7.7)
Neutrophils Relative %: 45 % (ref 43–77)
Platelets: 276 10*3/uL (ref 150–400)
RBC: 4.05 MIL/uL (ref 3.87–5.11)
RDW: 13.9 % (ref 11.5–15.5)
WBC: 5.7 10*3/uL (ref 4.0–10.5)

## 2014-04-17 LAB — BASIC METABOLIC PANEL
BUN: 15 mg/dL (ref 6–23)
CALCIUM: 9 mg/dL (ref 8.4–10.5)
CO2: 23 mEq/L (ref 19–32)
CREATININE: 0.77 mg/dL (ref 0.50–1.10)
Chloride: 106 mEq/L (ref 96–112)
GFR calc Af Amer: >90 mL/min (ref >90)
GFR calc non Af Amer: >90 mL/min (ref >90)
Glucose, Bld: 96 mg/dL (ref 70–99)
Potassium: 4.3 mEq/L (ref 3.7–5.3)
Sodium: 142 mEq/L (ref 137–147)

## 2014-04-17 LAB — URINALYSIS, ROUTINE W REFLEX MICROSCOPIC
BILIRUBIN URINE: NEGATIVE
Glucose, UA: NEGATIVE mg/dL
HGB URINE DIPSTICK: NEGATIVE
Ketones, ur: NEGATIVE mg/dL
Nitrite: NEGATIVE
Protein, ur: NEGATIVE mg/dL
Specific Gravity, Urine: 1.027 (ref 1.005–1.030)
UROBILINOGEN UA: 1 mg/dL (ref 0.0–1.0)
pH: 6.5 (ref 5.0–8.0)

## 2014-04-17 LAB — D-DIMER, QUANTITATIVE: D-Dimer, Quant: <0.27 ug/mL-FEU (ref 0.00–0.48)

## 2014-04-17 LAB — URINE MICROSCOPIC-ADD ON

## 2014-04-17 LAB — TROPONIN I

## 2014-04-17 NOTE — ED Notes (Signed)
C/o sob  States on Tuesday she passed out due to chemicals States she has SOB, nausea, and very tired

## 2014-04-17 NOTE — ED Notes (Signed)
The pt was sent down from [facility] .  She is c/o nausea sob none now and feeling tired and weak since she fainted on Tuesday of this week,  lmp none.  She fainted on an elevator where they were cleaning with strong chemicals.   Sl mid-chest tightness

## 2014-04-17 NOTE — ED Provider Notes (Signed)
Katherine Miller is a 58 y.o. female who presents to Urgent Care today for syncope and shortness of breath with exertion.  On Tuesday 4 days ago patient was in an elevator that had recently been cleaned. She's not cleaning chemicals. After several seconds she became very woozy and collapsed in the elevator. Give people the elevator dragged out of the elevator and revived her. She states that she thinks she was unconscious for 7-10 minutes. She denies any reports of seizure-like activity nor did she lose control of her bowel or bladder. Since the event she denies any further syncopal events. However she notes shortness of breath with exertion fatigue and nausea. She denies any orthopnea or leg swelling. She denies any chest pains, or palpitations. She denies any chest pain or palpitations preceding her syncopal event. No other people in the elevator had a similar syncopal event. Patient denies any wheezing preceding or following the syncopal event.   Past Medical History  Diagnosis Date  . Bronchitis    History  Substance Use Topics  . Smoking status: Never Smoker   . Smokeless tobacco: Not on file  . Alcohol Use: No   ROS as above Medications: No current facility-administered medications for this encounter.   Current Outpatient Prescriptions  Medication Sig Dispense Refill  . azithromycin (ZITHROMAX) 250 MG tablet Take 1 tablet (250 mg total) by mouth daily. Take first 2 tablets together, then 1 every day until finished.  6 tablet  0  . azithromycin (ZITHROMAX) 250 MG tablet Take 1 tablet (250 mg total) by mouth daily. Take first 2 tablets together, then 1 every day until finished.  6 tablet  0  . BIOTIN PO Take 1 tablet by mouth daily.      . calcium-vitamin D (OSCAL WITH D) 500-200 MG-UNIT per tablet Take 1 tablet by mouth daily.      Marland Kitchen gabapentin (NEURONTIN) 300 MG capsule QHS  30 capsule  3  . HYDROcodone-acetaminophen (NORCO/VICODIN) 5-325 MG per tablet Take 1 tablet by mouth every 6  (six) hours as needed for pain.  5 tablet  0  . hydrOXYzine (ATARAX/VISTARIL) 25 MG tablet Take 1 tablet (25 mg total) by mouth every 8 (eight) hours as needed for anxiety.  30 tablet  0  . meloxicam (MOBIC) 15 MG tablet Take 1 tablet (15 mg total) by mouth daily.  30 tablet  0  . Multiple Vitamin (MULTIVITAMIN WITH MINERALS) TABS Take 1 tablet by mouth daily.      . ondansetron (ZOFRAN) 4 MG tablet Take 1 tablet (4 mg total) by mouth every 6 (six) hours. Prn n/v  10 tablet  0  . OVER THE COUNTER MEDICATION Store brand items for cold and mucus      . pantoprazole (PROTONIX) 40 MG tablet Take 1 tablet (40 mg total) by mouth daily.  90 tablet  1  . predniSONE (DELTASONE) 50 MG tablet Take 1 tablet (50 mg total) by mouth daily.  5 tablet  0  . traMADol (ULTRAM) 50 MG tablet Take 1 tablet (50 mg total) by mouth at bedtime as needed for pain.  30 tablet  0  . VITAMIN E PO Take 1 tablet by mouth daily.      Marland Kitchen zolpidem (AMBIEN) 10 MG tablet Take 1 tablet (10 mg total) by mouth at bedtime as needed for sleep.  30 tablet  0    Exam:  BP 135/81  Pulse 81  Temp(Src) 98.5 F (36.9 C) (Oral)  Resp 16  SpO2 99% Gen: Well NAD HEENT: EOMI,  MMM, no JVD Lungs: Normal work of breathing. CTABL  Heart: RRR no MRG Abd: NABS, Soft. NT, ND Exts: Brisk capillary refill, warm and well perfused. Nonedematous bilateral lower extremities  Twelve-lead EKG shows normal sinus rhythm at 77 beats per minute. QTC 416. No ST segment elevation or depression.  No results found for this or any previous visit (from the past 24 hour(s)). No results found.  Assessment and Plan: 58 y.o. female with  1) syncopal event: Patient had a syncopal event 4 days ago after being exposed to some sort of chemicals. She was in a confined space however no other people in the same space had a similar event. I doubt she was exposed to high enough concentrations to cause collapse.  Additionally she denies any chest pains or palpitations  preceding the event. And she denies any wheezing. The etiology at this time is somewhat unclear. She possibly had a vasovagal event.  Regardless I am somewhat concerned. She is 58 years old and would benefit from a workup.  2) shortness of breath on exertion: Since the event the patient has had some shortness of breath with exertion. This is also concerning.  Plan to transfer patient to the emergency room via shuttle for evaluation and management of this issue  Discussed warning signs or symptoms. Please see discharge instructions. Patient expresses understanding.    Gregor Hams, MD 04/17/14 [phone]

## 2014-04-17 NOTE — Discharge Instructions (Signed)
Syncope Syncope is a fainting spell. This means the person loses consciousness and drops to the ground. The person is generally unconscious for less than 5 minutes. The person may have some muscle twitches for up to 15 seconds before waking up and returning to normal. Syncope occurs more often in elderly people, but it can happen to anyone. While most causes of syncope are not dangerous, syncope can be a sign of a serious medical problem. It is important to seek medical care.  CAUSES  Syncope is caused by a sudden decrease in blood flow to the brain. The specific cause is often not determined. Factors that can trigger syncope include:  Taking medicines that lower blood pressure.  Sudden changes in posture, such as standing up suddenly.  Taking more medicine than prescribed.  Standing in one place for too long.  Seizure disorders.  Dehydration and excessive exposure to heat.  Low blood sugar (hypoglycemia).  Straining to have a bowel movement.  Heart disease, irregular heartbeat, or other circulatory problems.  Fear, emotional distress, seeing blood, or severe pain. SYMPTOMS  Right before fainting, you may:  Feel dizzy or lightheaded.  Feel nauseous.  See all white or all black in your field of vision.  Have cold, clammy skin. DIAGNOSIS  Your caregiver will ask about your symptoms, perform a physical exam, and perform electrocardiography (ECG) to record the electrical activity of your heart. Your caregiver may also perform other heart or blood tests to determine the cause of your syncope. TREATMENT  In most cases, no treatment is needed. Depending on the cause of your syncope, your caregiver may recommend changing or stopping some of your medicines. HOME CARE INSTRUCTIONS  Have someone stay with you until you feel stable.  Do not drive, operate machinery, or play sports until your caregiver says it is okay.  Keep all follow-up appointments as directed by your  caregiver.  Lie down right away if you start feeling like you might faint. Breathe deeply and steadily. Wait until all the symptoms have passed.  Drink enough fluids to keep your urine clear or pale yellow.  If you are taking blood pressure or heart medicine, get up slowly, taking several minutes to sit and then stand. This can reduce dizziness. SEEK IMMEDIATE MEDICAL CARE IF:   You have a severe headache.  You have unusual pain in the chest, abdomen, or back.  You are bleeding from the mouth or rectum, or you have black or tarry stool.  You have an irregular or very fast heartbeat.  You have pain with breathing.  You have repeated fainting or seizure-like jerking during an episode.  You faint when sitting or lying down.  You have confusion.  You have difficulty walking.  You have severe weakness.  You have vision problems. If you fainted, call your local emergency services (911 in [place] Do not drive yourself to the hospital.  MAKE SURE YOU:  Understand these instructions.  Will watch your condition.  Will get help right away if you are not doing well or get worse. Document Released: 12/10/2005 Document Revised: 06/10/2012 Document Reviewed: 02/08/2012 [facility] Patient Information 2014 [place].   Emergency Department Resource Guide 1) Find a Doctor and Pay Out of Pocket Although you won't have to find out who is covered by your insurance plan, it is a good idea to ask around and get recommendations. You will then need to call the office and see if the doctor you have chosen will accept you as a  new patient and what types of options they offer for patients who are self-pay. Some doctors offer discounts or will set up payment plans for their patients who do not have insurance, but you will need to ask so you aren't surprised when you get to your appointment.  2) Contact Your Local Health Department Not all health departments have doctors that can see patients for  sick visits, but many do, so it is worth a call to see if yours does. If you don't know where your local health department is, you can check in your phone book. The CDC also has a tool to help you locate your state's health department, and many state websites also have listings of all of their local health departments.  3) Find a [facility] If your illness is not likely to be very severe or complicated, you may want to try a walk in clinic. These are popping up all over the country in pharmacies, drugstores, and shopping centers. They're usually staffed by nurse practitioners or physician assistants that have been trained to treat common illnesses and complaints. They're usually fairly quick and inexpensive. However, if you have serious medical issues or chronic medical problems, these are probably not your best option.  No Primary Care Doctor: - Call Health Connect at  [phone] - they can help you locate a primary care doctor that  accepts your insurance, provides certain services, etc. - Physician Referral Service- [phone]  Chronic Pain Problems: Organization         Address  Phone   Notes  [facility]  [phone] Patients need to be referred by their primary care doctor.   Medication Assistance: Organization         Address  Phone   Notes  [facility] Medication [facility] [address] [place], [place] [address] [phone] --Must be a resident of [facility] -- Must have NO insurance coverage whatsoever (no Medicaid/ Medicare, etc.) -- The pt. MUST have a primary care doctor that directs their care regularly and follows them in the community   [facility]  [phone]   [employer]  [phone]    Agencies that provide inexpensive medical care: Organization         Address  Phone   Notes  [place]  [phone]   [facility]    [phone]   [facility]  [place], [place] [address] [phone]   [place] [address], [place] [phone]   Planned Parenthood    [phone]   [facility]    [phone]   [place] [address], [place] Phone:  [phone], Fax:  [phone] Hours of Operation:  9 am - 6 pm, M-F.  Also accepts Medicaid/Medicare and self-pay.  [facility] for [place], [address], [place] Phone: [phone], Fax: [phone]. Hours of Operation:  8:30 am - 5:30 pm, M-F.  Also accepts Medicaid and self-pay.  [facility] [place] [address] [place] Phone: [phone]   [place], [place], [place] [phone], Ext. [phone] Mondays & Thursdays: 7-9 AM.  First 15 patients are seen on a first come, first serve basis.    [place] Providers:  Organization         Address  Phone   Notes  [facility] [address], [place] [phone] Also accepts self-pay patients.  [facility] [address], [place]  [phone]   [place], [address], [place] [phone]   [facility] [address] [place] [phone]   Lucianne Lei [address], [place]   [phone] Only accepts [place] [facility] [place] patients after they have their name applied to their card.   Self-Pay (no insurance) in [facility]:  Organization         Address  Phone   Notes  Sickle Cell Patients, [facility] [place] [phone]   [facility] [place] [phone]   [facility] [place], [place], [place] [phone]   [facility]/Dr. Osei-Bonsu  [address], [place] or [address], [place] [phone] Phone number for both [place] and [place] locations is the same.  [facility] [address], [place] [phone]   [facility] [address], [place] or [address] [phone]   [facility] [address] [place] [phone], phone; [phone], fax Sees patients 1st and 3rd Saturday of every month.  Must not qualify for public or private insurance (i.e. Medicaid, Medicare, [place] Health Choice, Veterans' Benefits)  Household income should be no more than 200% of the poverty level The clinic cannot treat you if you are pregnant or think you are pregnant  Sexually transmitted diseases are not treated at the clinic.    Dental Care: Organization         Address  Phone  Notes  [facility] [place] [phone] Accepts children up to age 31 who are enrolled in [place] or [place]; pregnant women with a Medicaid card; and children who have applied for Medicaid or [place] Health Choice, but were declined, whose parents can pay a reduced fee at time of service.  [facility]  [address], [place] [phone] Accepts children up to age 66 who are enrolled in [place] or [place]; pregnant women with a Medicaid card; and children who have applied for Medicaid or [place] Health Choice, but were declined, whose parents can pay a reduced fee at time of service.  [place] [facility]  [place] [phone] Patients are seen by appointment only. Walk-ins are not accepted. [place] will see patients 31 years of age and older. Monday - Tuesday (8am-5pm) Most Wednesdays (8:30-5pm) $30 per visit, cash only  [facility] Adult [facility] Access PRO[facility]  [address], [facility] [phone] Patients are seen by appointment only. Walk-ins are not accepted. [place] will see patients 34 years of age and older. One Wednesday Evening (Monthly: Volunteer Based).  $30 per visit, cash only  [place]  [phone] for adults; Children under age 2, call Graduate Pediatric Dentistry at [phone]. Children aged 25-14, please call [phone] to request a pediatric application.  Dental services are provided in all areas of dental care including fillings, crowns  and bridges, complete and partial dentures, implants, gum treatment, root canals, and extractions. Preventive care is also provided. Treatment is provided to both adults and children. °Patients are selected via a lottery and there is often a waiting list. °  °[facility] [address], °[place] ° ([phone] [email or web address] °  °[facility] [address], [place], [place] [phone], Ext. [phone] Second and Fourth Thursday of each month, opens at 6:30 AM; Clinic ends at 9 AM.  Patients are seen on a first-come first-served basis, and a limited number are seen during each clinic.  ° °[facility] ° [address], [place], [place] [phone]   Eligibility Requirements °You must have lived in [place], [place], or [place] counties for at least the last three months. °  You cannot be eligible for state or federal sponsored healthcare insurance, including Veterans Administration, Medicaid, or Medicare. °  You generally cannot be eligible for healthcare insurance through your employer.  °  How to apply: °Eligibility screenings are held every Tuesday and Wednesday afternoon from 1:00 pm until 4:00 pm. You do not need an appointment for the interview!  °[facility] [address], [place], [place] [phone]   °[facility]  [phone]   °[facility]  [phone]    °[place] [facility]  [phone]   ° °Behavioral Health Resources in the Community: °Intensive Outpatient Programs °Organization         Address  Phone  Notes  °[facility] [address], [place], [place] [phone]   °[facility] [address], [place], [place] [phone]   °[facility] [address], [place], [place] ° [phone]   °[facility] [address],  °[place], [place] [phone] or [phone]   °Substance Abuse Resources °Organization         Address  Phone  Notes  °[facility]  [phone]   °[facility]  [phone]   °[facility]  [phone]   °[facility]  [phone]   °Residential & Outpatient Substance Abuse Program  1-800-659-3381   °Psychological Services °Organization         Address  Phone  Notes  °[facility]  336- [phone]   °[facility]  336- [phone]   °[facility] [address], [place] 1-800-853-5163 or 336-641-4981   ° °Mobile Crisis Teams °Organization         Address  Phone  Notes  °[facility], Mobile Crisis Care Unit  1-877-626-1772   °Assertive °[facility] ° [address] [place], [place] [phone]   °Sharon DeEsch [address] °[place] [phone]   ° °Self-Help/Support Groups °Organization         Address  Phone             Notes  °[facility] [place] - variety of support groups  336- [phone] Call for more information  °Narcotics Anonymous (NA), [facility] [address], °[place]  2 meetings at this location  ° °Residential Treatment Programs °Organization         Address  Phone  Notes  °[facility] [address],    °[place]  1-866-801-8205   °[facility] ° [address], [place], [place] [phone]   °[facility] [address], [place]    [phone] Admissions: 8am-3pm M-F  [facility] Substance [place] [address]    [place], [place] [phone]   [facility] [address] [place], [place], [place]   [facility] [address],  [place], [place]   [facility] - Intensive Outpatient [address], [place], [place]   [facility] [address]  [place], [place] [phone] or [phone]   [facility] ([facility]) [address] [place], [place] Accepts Medicaid  [facility] [address],  [place] [phone] Substance Abuse/Addiction Treatment   [facility] Organization         Address  Phone  Notes  [facility]  [phone]   Domenic Schwab, PhD [address] [place], [place]   [phone] or [phone]   [place], [place] [phone]   [facility] [address] [place], [place] [phone] Insurance/Medicaid/sponsorship through [facility] and Families [address]                                    [place], [place] [phone] [place] [address], [place] [phone]    Dr. Adele Schilder  [phone]   [facility] 1) [address],  2) [place] 3)  [address], [place] [phone]  [phone]   [place] [phone] or [phone] (After Hours)

## 2014-04-17 NOTE — ED Provider Notes (Signed)
CSN: [id number]     Arrival date & time 04/17/14  5176 History   First MD Initiated Contact with Patient 04/17/14 1905     Chief Complaint  Patient presents with  . Loss of Consciousness     (Consider location/radiation/quality/duration/timing/severity/associated sxs/prior Treatment) HPI Comments: Pt comes in today with c/o tiredness and sob after syncope 4 days ago. Pt states that she was on a elevator with chemicals which she doesn't tolerate very well and she remember the doors opening and not feeling well and then she was on the floor with people standing over her. Pt is unsure of how long she was out. Has not had similar event since that time. Pt states that she feels sob, sob with exertion and fatigue since the episode. Pt states that she hasn't had and cp associated with the event.she states that she has also been working 6 days a week for the last 6 weeks. Pt has no pcp at this time but denies any medical problems  The history is provided by the patient. No language interpreter was used.    Past Medical History  Diagnosis Date  . Bronchitis    Past Surgical History  Procedure Laterality Date  . Cholecystectomy     No family history on file. History  Substance Use Topics  . Smoking status: Never Smoker   . Smokeless tobacco: Not on file  . Alcohol Use: No   OB History   Grav Para Term Preterm Abortions TAB SAB Ect Mult Living                 Review of Systems  Constitutional: Negative.   Eyes: Negative.   Respiratory: Positive for shortness of breath.   Cardiovascular: Negative.   Neurological: Positive for syncope.      Allergies  Review of patient's allergies indicates no known allergies.  Home Medications   Prior to Admission medications   Medication Sig Start Date End Date Taking? Authorizing Provider  gabapentin (NEURONTIN) 300 MG capsule Take 300 mg by mouth at bedtime.   Yes Historical Provider, MD  azithromycin (ZITHROMAX) 250 MG tablet Take 1  tablet (250 mg total) by mouth daily. Take first 2 tablets together, then 1 every day until finished. 05/03/13   Mervin Kung, MD  azithromycin (ZITHROMAX) 250 MG tablet Take 1 tablet (250 mg total) by mouth daily. Take first 2 tablets together, then 1 every day until finished. 12/28/13   Fransico Meadow, PA-C  BIOTIN PO Take 1 tablet by mouth daily.    Historical Provider, MD  calcium-vitamin D (OSCAL WITH D) 500-200 MG-UNIT per tablet Take 1 tablet by mouth daily.    Historical Provider, MD  HYDROcodone-acetaminophen (NORCO/VICODIN) 5-325 MG per tablet Take 1 tablet by mouth every 6 (six) hours as needed for pain. 10/06/13   Gregor Hams, MD  hydrOXYzine (ATARAX/VISTARIL) 25 MG tablet Take 1 tablet (25 mg total) by mouth every 8 (eight) hours as needed for anxiety. 12/28/13   Fransico Meadow, PA-C  meloxicam (MOBIC) 15 MG tablet Take 1 tablet (15 mg total) by mouth daily. 01/12/14   Lyndal Pulley, DO  Multiple Vitamin (MULTIVITAMIN WITH MINERALS) TABS Take 1 tablet by mouth daily.    Historical Provider, MD  ondansetron (ZOFRAN) 4 MG tablet Take 1 tablet (4 mg total) by mouth every 6 (six) hours. Prn n/v 08/28/13   Billy Fischer, MD  OVER THE COUNTER MEDICATION Store brand items for cold and mucus  Historical Provider, MD  pantoprazole (PROTONIX) 40 MG tablet Take 1 tablet (40 mg total) by mouth daily. 03/25/14   Lyndal Pulley, DO  predniSONE (DELTASONE) 50 MG tablet Take 1 tablet (50 mg total) by mouth daily. 01/12/14   Lyndal Pulley, DO  traMADol (ULTRAM) 50 MG tablet Take 1 tablet (50 mg total) by mouth at bedtime as needed for pain. 10/08/13   Lyndal Pulley, DO  VITAMIN E PO Take 1 tablet by mouth daily.    Historical Provider, MD  zolpidem (AMBIEN) 10 MG tablet Take 1 tablet (10 mg total) by mouth at bedtime as needed for sleep. 08/28/13   Billy Fischer, MD   BP 126/76  Pulse 79  Temp(Src) 97.8 F (36.6 C) (Oral)  Resp 16  SpO2 97% Physical Exam  Nursing note and vitals  reviewed. Constitutional: She is oriented to person, place, and time. She appears well-developed and well-nourished.  HENT:  Head: Normocephalic.  Eyes: Conjunctivae and EOM are normal.  Neck: Neck supple.  Cardiovascular: Normal rate and regular rhythm.   Pulmonary/Chest: Effort normal and breath sounds normal.  Abdominal: Soft. Bowel sounds are normal. There is no tenderness.  Musculoskeletal: Normal range of motion. She exhibits no edema.  Neurological: She is alert and oriented to person, place, and time. She exhibits normal muscle tone. Coordination normal.  Skin: Skin is warm and dry.  Psychiatric: She has a normal mood and affect.    ED Course  Procedures (including critical care time) Labs Review Labs Reviewed  CBC WITH DIFFERENTIAL  BASIC METABOLIC PANEL  TROPONIN I  URINALYSIS, ROUTINE W REFLEX MICROSCOPIC  D-DIMER, QUANTITATIVE    Imaging Review No results found.   EKG Interpretation None      MDM   Final diagnoses:  None    Pt labs pending to be follow up Dr Wilson Singer    Glendell Docker, NP 04/17/14 2012

## 2014-04-18 NOTE — ED Provider Notes (Signed)
  Medical screening examination/treatment/procedure(s) were performed by non-physician practitioner and as supervising physician I was immediately available for consultation/collaboration.  EKG has a sinus rhythm, rate 73, regular, normal   Carmin Muskrat, MD 04/18/14 1308

## 2014-10-19 ENCOUNTER — Other Ambulatory Visit: Payer: Self-pay | Admitting: Family Medicine

## 2014-10-19 ENCOUNTER — Ambulatory Visit
Admission: RE | Admit: 2014-10-19 | Discharge: 2014-10-19 | Disposition: A | Discharge status: Home / Self Care | Payer: PRIVATE HEALTH INSURANCE | Source: Ambulatory Visit | Attending: Family Medicine | Admitting: Family Medicine

## 2014-10-19 DIAGNOSIS — R0602 Shortness of breath: Secondary | ICD-10-CM

## 2014-12-28 ENCOUNTER — Other Ambulatory Visit (HOSPITAL_COMMUNITY): Payer: Self-pay | Admitting: Family Medicine

## 2014-12-28 ENCOUNTER — Other Ambulatory Visit: Payer: Self-pay | Admitting: Family Medicine

## 2014-12-28 DIAGNOSIS — Z1231 Encounter for screening mammogram for malignant neoplasm of breast: Secondary | ICD-10-CM

## 2014-12-28 DIAGNOSIS — N2889 Other specified disorders of kidney and ureter: Secondary | ICD-10-CM

## 2014-12-29 ENCOUNTER — Ambulatory Visit (HOSPITAL_COMMUNITY)
Admission: RE | Admit: 2014-12-29 | Discharge: 2014-12-29 | Disposition: A | Discharge status: Home / Self Care | Payer: PRIVATE HEALTH INSURANCE | Source: Ambulatory Visit | Attending: Family Medicine | Admitting: Family Medicine

## 2014-12-29 DIAGNOSIS — Z1231 Encounter for screening mammogram for malignant neoplasm of breast: Secondary | ICD-10-CM | POA: Diagnosis not present

## 2014-12-30 ENCOUNTER — Other Ambulatory Visit: Payer: PRIVATE HEALTH INSURANCE

## 2014-12-31 ENCOUNTER — Ambulatory Visit
Admission: RE | Admit: 2014-12-31 | Discharge: 2014-12-31 | Disposition: A | Discharge status: Home / Self Care | Payer: PRIVATE HEALTH INSURANCE | Source: Ambulatory Visit | Attending: Family Medicine | Admitting: Family Medicine

## 2014-12-31 DIAGNOSIS — N2889 Other specified disorders of kidney and ureter: Secondary | ICD-10-CM

## 2015-01-31 ENCOUNTER — Other Ambulatory Visit (HOSPITAL_COMMUNITY): Payer: Self-pay | Admitting: Urology

## 2015-01-31 DIAGNOSIS — N281 Cyst of kidney, acquired: Secondary | ICD-10-CM

## 2015-02-04 ENCOUNTER — Ambulatory Visit (HOSPITAL_COMMUNITY)
Admission: RE | Admit: 2015-02-04 | Discharge: 2015-02-04 | Disposition: A | Discharge status: Home / Self Care | Payer: PRIVATE HEALTH INSURANCE | Source: Ambulatory Visit | Attending: Urology | Admitting: Urology

## 2015-02-04 DIAGNOSIS — N2889 Other specified disorders of kidney and ureter: Secondary | ICD-10-CM | POA: Diagnosis present

## 2015-02-04 MED ORDER — GADOBENATE DIMEGLUMINE 529 MG/ML IV SOLN
15.0000 mL | Freq: Once | INTRAVENOUS | Status: AC | PRN
  Administered 2015-02-04: 16 mL via INTRAVENOUS

## 2015-02-11 ENCOUNTER — Inpatient Hospital Stay (HOSPITAL_COMMUNITY): Admission: RE | Admit: 2015-02-11 | Payer: PRIVATE HEALTH INSURANCE | Source: Ambulatory Visit

## 2015-03-22 ENCOUNTER — Ambulatory Visit (INDEPENDENT_AMBULATORY_CARE_PROVIDER_SITE_OTHER): Payer: PRIVATE HEALTH INSURANCE

## 2015-03-22 ENCOUNTER — Ambulatory Visit (INDEPENDENT_AMBULATORY_CARE_PROVIDER_SITE_OTHER): Payer: PRIVATE HEALTH INSURANCE | Admitting: Family Medicine

## 2015-03-22 DIAGNOSIS — M5412 Radiculopathy, cervical region: Secondary | ICD-10-CM

## 2015-03-22 DIAGNOSIS — G629 Polyneuropathy, unspecified: Secondary | ICD-10-CM

## 2015-03-22 DIAGNOSIS — S66912S Strain of unspecified muscle, fascia and tendon at wrist and hand level, left hand, sequela: Secondary | ICD-10-CM

## 2015-03-22 DIAGNOSIS — S43421A Sprain of right rotator cuff capsule, initial encounter: Secondary | ICD-10-CM | POA: Diagnosis not present

## 2015-03-22 MED ORDER — PREDNISONE 20 MG PO TABS: ORAL_TABLET | ORAL | Status: DC

## 2015-03-22 MED ORDER — CYCLOBENZAPRINE HCL 10 MG PO TABS: 10.0000 mg | ORAL_TABLET | Freq: Three times a day (TID) | ORAL | Status: DC | PRN

## 2015-03-22 MED ORDER — NAPROXEN 500 MG PO TABS: 500.0000 mg | ORAL_TABLET | Freq: Two times a day (BID) | ORAL | Status: DC

## 2015-03-22 MED ORDER — HYDROCODONE-ACETAMINOPHEN 5-325 MG PO TABS: 1.0000 | ORAL_TABLET | ORAL | Status: DC | PRN

## 2015-03-22 NOTE — Patient Instructions (Signed)
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
## 2015-03-24 ENCOUNTER — Telehealth: Payer: Self-pay

## 2015-03-24 NOTE — Telephone Encounter (Signed)
Pt dropped off FMLA paperwork on 03/23/15 for completion in 5-7 business days. Patient was seen by Shawnee Knapp, MD at 03/22/2015 3:26 PM for Cervical radiculopathy, acute and put out of work for the next two months until released by physician. Have completed portions of the FMLA with OV notes, will place in Dr. Raul Del box for review and signature. Blank copies have been scanned into epic, please return to disability department, jasmine or myself will scan into release that has been placed on hold, fax to [phone], and contact patient.

## 2015-03-29 NOTE — Telephone Encounter (Signed)
completed fmla and short term disability papers

## 2015-03-30 NOTE — Telephone Encounter (Signed)
Paperwork has been faxed. Patient notified. She will come pick up the original copies from [facility].

## 2015-04-01 ENCOUNTER — Other Ambulatory Visit (HOSPITAL_COMMUNITY): Payer: Self-pay | Admitting: Family Medicine

## 2015-04-01 DIAGNOSIS — M8580 Other specified disorders of bone density and structure, unspecified site: Secondary | ICD-10-CM

## 2015-04-01 DIAGNOSIS — Z0271 Encounter for disability determination: Secondary | ICD-10-CM

## 2015-04-04 ENCOUNTER — Ambulatory Visit (HOSPITAL_COMMUNITY)
Admission: RE | Admit: 2015-04-04 | Discharge: 2015-04-04 | Disposition: A | Discharge status: Home / Self Care | Payer: PRIVATE HEALTH INSURANCE | Source: Ambulatory Visit | Attending: Family Medicine | Admitting: Family Medicine

## 2015-04-05 ENCOUNTER — Ambulatory Visit (HOSPITAL_COMMUNITY): Payer: PRIVATE HEALTH INSURANCE

## 2015-04-07 ENCOUNTER — Ambulatory Visit (INDEPENDENT_AMBULATORY_CARE_PROVIDER_SITE_OTHER): Payer: PRIVATE HEALTH INSURANCE | Admitting: Family Medicine

## 2015-04-07 VITALS — BP 120/72 | HR 82 | Temp 98.3°F | Resp 18 | Ht 64.0 in | Wt 181.0 lb

## 2015-04-07 DIAGNOSIS — M25532 Pain in left wrist: Secondary | ICD-10-CM | POA: Diagnosis not present

## 2015-04-07 DIAGNOSIS — K219 Gastro-esophageal reflux disease without esophagitis: Secondary | ICD-10-CM | POA: Diagnosis not present

## 2015-04-07 DIAGNOSIS — S46012S Strain of muscle(s) and tendon(s) of the rotator cuff of left shoulder, sequela: Secondary | ICD-10-CM | POA: Diagnosis not present

## 2015-04-07 DIAGNOSIS — G5692 Unspecified mononeuropathy of left upper limb: Secondary | ICD-10-CM

## 2015-04-07 DIAGNOSIS — M62838 Other muscle spasm: Secondary | ICD-10-CM

## 2015-04-07 DIAGNOSIS — M25332 Other instability, left wrist: Secondary | ICD-10-CM

## 2015-04-07 MED ORDER — CYCLOBENZAPRINE HCL 10 MG PO TABS: 10.0000 mg | ORAL_TABLET | Freq: Three times a day (TID) | ORAL | Status: DC | PRN

## 2015-04-07 MED ORDER — GABAPENTIN 300 MG PO CAPS: 300.0000 mg | ORAL_CAPSULE | Freq: Every day | ORAL | Status: DC

## 2015-04-07 MED ORDER — RANITIDINE HCL 150 MG PO TABS: 150.0000 mg | ORAL_TABLET | Freq: Two times a day (BID) | ORAL | Status: DC | PRN

## 2015-04-07 MED ORDER — MELOXICAM 15 MG PO TABS: 15.0000 mg | ORAL_TABLET | Freq: Every day | ORAL | Status: DC

## 2015-04-07 NOTE — Progress Notes (Signed)
Subjective:    Patient ID: Katherine Miller, female    DOB: 10/06/1956, 59 y.o.   MRN: [id number] This chart was scribed for Delman Cheadle, MD by Zola Button, Medical Scribe. This patient was seen in Room 10 and the patient's care was started at 9:48 AM.   Chief Complaint  Patient presents with  . Follow-up    left wrist, also ref states rt shoulder not lt shoulder     HPI HPI Comments: Katherine Miller is a 59 y.o. female who presents to the [facility] for a follow-up.  She works at [employer] as a Psychologist, sport and exercise. She had an injury at work that caused neck pain radiation to her left arm and left wrist. She pursued treatment through her worker's comp for 6 weeks without improvement of symptoms. Therefore presented to clinic 2 weeks ago requesting release out of work and pursue treatment on her private insurance. FMLA papers were completed to remove her from work for 2 months during her evaluation and treatment. Patient was put on prolonged prednisone taper with prn hydrocodone. Imaging was done of her cervical spine, which was normal as well as her left shoulder, left elbow, and left wrist. The former 2 of which were normal, but left wrist showed some possible carpal abnormalities. Could not rule out occult scaphoid fracture or possible scapholunate instability. Referred patient to [facility] and [facility] for further management.  Left Shoulder/Wrist Pain: She did go to [facility] and was told she possibly needed an MRI for possible hairline fracture. She has not gone to orthopedics and has not scheduled an appointment yet. The prednisone has been providing her relief. She has been taking the hydrocodone every once in a while. Patient usually takes off her splint at home and at night. She has been having intermittent left shoulder pain, intermittent burning left wrist pain and numbness to her 2nd-4th left fingers, along with new pain over radial  aspect of proximal thumb. Driving, some daily activities, moving around worsens her left shoulder pain. Her left wrist pain is worsened when lifting heavier objects (such as a jug of milk), which sometimes radiates to her shoulder or mid-arm. Patient denies prior wrist injuries.   Acid Reflux: Patient does have a problem with acid reflux/indigestion. She has been taking omeprazole for a long time for this. She thinks she may have to adjust her medication as she had to take 2 last night. Patient has not been taking her medication before meals.  Past Medical History  Diagnosis Date  . Bronchitis   . Allergy   . Anemia   . Cancer    Current Outpatient Prescriptions on File Prior to Visit  Medication Sig Dispense Refill  . HYDROcodone-acetaminophen (NORCO/VICODIN) 5-325 MG per tablet Take 1-2 tablets by mouth every 4 (four) hours as needed. 40 tablet 0  . hydrOXYzine (ATARAX/VISTARIL) 25 MG tablet Take 1 tablet (25 mg total) by mouth every 8 (eight) hours as needed for anxiety. 30 tablet 0  . Multiple Vitamin (MULTIVITAMIN WITH MINERALS) TABS Take 1 tablet by mouth daily.    Marland Kitchen zolpidem (AMBIEN) 10 MG tablet Take 1 tablet (10 mg total) by mouth at bedtime as needed for sleep. 30 tablet 0   No current facility-administered medications on file prior to visit.   No Known Allergies  Review of Systems  Constitutional: Positive for activity change. Negative for fever, chills and unexpected weight change.  Respiratory: Negative for chest tightness and shortness  of breath.   Cardiovascular: Positive for chest pain. Negative for palpitations.  Gastrointestinal: Positive for abdominal pain. Negative for nausea, vomiting and abdominal distention.  Musculoskeletal: Positive for myalgias, back pain, arthralgias, neck pain and neck stiffness. Negative for joint swelling and gait problem.  Skin: Negative for color change and rash.  Neurological: Positive for weakness and numbness.  Psychiatric/Behavioral:  Positive for sleep disturbance.       Objective:   Physical Exam  Constitutional: She is oriented to person, place, and time. She appears well-developed and well-nourished. No distress.  HENT:  Head: Normocephalic and atraumatic.  Mouth/Throat: Oropharynx is clear and moist. No oropharyngeal exudate.  Eyes: Pupils are equal, round, and reactive to light.  Neck: Neck supple.  Pain with extreme left lateral rotation and left lateral flexion. No tenderness over cervical spinous process.  Cardiovascular: Normal rate.   Pulses:      Radial pulses are 2+ on the left side.  2+ ulnar pulse.  Pulmonary/Chest: Effort normal.  Musculoskeletal: She exhibits tenderness. She exhibits no edema.  Normal clavicle and acromion, no tenderness. No tenderness over AC joint or corticoid. No tenderness to the anatomic snuffbox. TTP ulnar styloid, radial styloid, and 5th metacarpal.  Neurological: She is alert and oriented to person, place, and time. No cranial nerve deficit.  Reflex Scores:      Tricep reflexes are 2+ on the right side and 2+ on the left side.      Bicep reflexes are 2+ on the right side and 2+ on the left side.      Brachioradialis reflexes are 2+ on the right side and 2+ on the left side. Skin: Skin is warm and dry. No rash noted.  Psychiatric: She has a normal mood and affect. Her behavior is normal.  Vitals reviewed.  BP 120/72 mmHg  Pulse 82  Temp(Src) 98.3 F (36.8 C) (Oral)  Resp 18  Ht 5\' 4"  (1.626 m)  Wt 181 lb (82.101 kg)  BMI 31.05 kg/m2  SpO2 95%     Assessment & Plan:   1. Wrist pain, acute, left   2. Neuropathy, upper extremity, left - may need NCV to identify etiology  3. Rotator cuff (capsule) sprain and strain, left, sequela  - reminded pt to call [facility] to sched appt - referred at last OV  4. Muscle spasm of left shoulder  - Cont PT at [facility]  5. Gastroesophageal reflux disease, esophagitis presence not specified - increase omeprazole to bid  w/ prn h2 blocker for breakthrough sxs. No prn nsaids - try cox-2 instead  6. Scapho-lunate dissociation, left  - need MRI as pain is worsening w/ PT, esp tender to palp  - refer to hand surg or ortho for further eval/trx. Start wearing thumb spica splint constantly rather than prn cock-up wrist splint due to worsening pain - start qd meloxicam with qhs gabapentin    Orders Placed This Encounter  Procedures  . MR Wrist Left Wo Contrast    Standing Status: Future     Number of Occurrences:      Standing Expiration Date: 06/06/2016    Order Specific Question:  Reason for Exam (SYMPTOM  OR DIAGNOSIS REQUIRED)    Answer:  worsening pain over radial aspect    Order Specific Question:  Preferred imaging location?    Answer:  [id number]    Order Specific Question:  Does the patient have a pacemaker or implanted devices?    Answer:  No  Order Specific Question:  What is the patient's sedation requirement?    Answer:  No Sedation  . Ambulatory referral to Hand Surgery    Referral Priority:  Routine    Referral Type:  Surgical    Referral Reason:  Specialty Services Required    Requested Specialty:  Hand Surgery    Number of Visits Requested:  1    Meds ordered this encounter  Medications  . omeprazole (PRILOSEC) 20 MG capsule    Sig: Take 20 mg by mouth 2 (two) times daily before a meal.  . gabapentin (NEURONTIN) 300 MG capsule    Sig: Take 1 capsule (300 mg total) by mouth at bedtime.    Dispense:  30 capsule    Refill:  1  . ranitidine (ZANTAC) 150 MG tablet    Sig: Take 1 tablet (150 mg total) by mouth 2 (two) times daily as needed for heartburn.    Dispense:  60 tablet    Refill:  0  . meloxicam (MOBIC) 15 MG tablet    Sig: Take 1 tablet (15 mg total) by mouth daily.    Dispense:  30 tablet    Refill:  1  . cyclobenzaprine (FLEXERIL) 10 MG tablet    Sig: Take 1 tablet (10 mg total) by mouth 3 (three) times daily as needed for muscle spasms.    Dispense:  30 tablet     Refill:  0    I personally performed the services described in this documentation, which was scribed in my presence. The recorded information has been reviewed and considered, and addended by me as needed.  Delman Cheadle, MD MPH

## 2015-04-07 NOTE — Patient Instructions (Addendum)
Call [facility] at [phone] to schedule an appointment.  We will also refer you to a hand surgeon for your wrist but if you get in with [facility] before than they can tell you whether they want you to keep this appointment.  Make sure you are taking your omeprazole 30 minutes before breakfast and dinner. If you have additional pain, try taking zantac (ranitidine), pepcid (famotidine) or TUMS.  Please have physical therapy only work on your muscle spasms - esp in your upper back - trapezius and rhomboid muscle spasms.  You may need a nerve conduction velocity study to see where the burning pain is but it is my hope that you will get in with a specialist who will be able to determine if this is needed.  Wear the left wrist thumb spica splint at all times until further instructed.  Get a hand surgery and left wrist MRI at the first available.  You need to be on an non-steroidal anti-inflammatory regularly so start meloxicam - stop the naproxyn. Do not use with any other otc pain medication other than tylenol/acetaminophen - so no aleve, ibuprofen, motrin, advil, etc. Start the gabapentin in the evenings to help with the burning nerve pain.

## 2015-04-26 ENCOUNTER — Encounter: Payer: Self-pay | Admitting: Family Medicine

## 2015-04-26 ENCOUNTER — Ambulatory Visit (INDEPENDENT_AMBULATORY_CARE_PROVIDER_SITE_OTHER): Payer: PRIVATE HEALTH INSURANCE | Admitting: Family Medicine

## 2015-04-26 ENCOUNTER — Other Ambulatory Visit (INDEPENDENT_AMBULATORY_CARE_PROVIDER_SITE_OTHER): Payer: PRIVATE HEALTH INSURANCE

## 2015-04-26 VITALS — BP 106/70 | HR 101 | Wt 186.0 lb

## 2015-04-26 DIAGNOSIS — M75102 Unspecified rotator cuff tear or rupture of left shoulder, not specified as traumatic: Secondary | ICD-10-CM

## 2015-04-26 DIAGNOSIS — M25512 Pain in left shoulder: Secondary | ICD-10-CM

## 2015-04-26 MED ORDER — VITAMIN D (ERGOCALCIFEROL) 1.25 MG (50000 UNIT) PO CAPS: 50000.0000 [IU] | ORAL_CAPSULE | ORAL | Status: DC

## 2015-04-26 NOTE — Progress Notes (Signed)
CC: sided shoulder pain  HPI: Patient is a 59 year old right-hand-dominant female for recurrent shoulder pain. 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 Patient was last seen in April 2015 for this problem.states patient was seen by  primaryCare provider and a x-raytaken. X-rays were reviewed by me and on March 29 patient had normal x-rays of the left shoulder. Patient states unfortunately that she continues to have more of a left-sided pain.patient states that unfortunatelyhas pain with certain positioning. Patient was also having a concern with her neck pain. There is a possibility of some mild to moderate osteophytic changes which has been seen on x-rays previously.states that this pain is starting to affect her daily activities and she is unable to comb her hair or even drive sometimes significant injury to the pain. Patient has had difficulty with the left wrist which things could be contributing and she has been compensating with this shoulder. Patient states that she rolls onto that side at night he can wake her up. Rates the severity of pain as 9 out of 10. Dull ache with sharp pain with certain movements.     Past medical, surgical, family and social history reviewed. Medications reviewed all in the electronic medical record.   Review of Systems: No headache, visual changes, nausea, vomiting, diarrhea, constipation, dizziness, abdominal pain, skin rash, fevers, chills, night sweats, weight loss, swollen lymph nodes, body aches, joint swelling, muscle aches, chest pain, shortness of breath, mood changes.   Objective:    Blood pressure 106/70, pulse 101, weight 186 lb (84.369 kg), SpO2 96 %.   General: No apparent distress alert and oriented x3 mood and affect normal, dressed appropriately.  HEENT: Pupils equal, extraocular movements intact Respiratory: Patient's speak in full sentences and does not appear short of  breath Cardiovascular: No lower extremity edema, non tender, no erythema Skin: Warm dry intact with no signs of infection or rash on extremities or on axial skeleton. Abdomen: Soft nontender Neuro: Cranial nerves II through XII are intact, neurovascularly intact in all extremities with 2+ DTRs and 2+ pulses. Lymph: No lymphadenopathy of posterior or anterior cervical chain or axillae bilaterally.  Gait normal with good balance and coordination.  MSK: Non tender with full range of motion and good stability and symmetric strength and tone of elbows, wrist, hip, knee and ankles bilaterally.   Shoulder: left Inspection reveals no abnormalities, atrophy or asymmetry. Palpation i tenderness over AC joint but not  bicipital groove. ROM  is improved from previous visit Rotator cuff strength normal throughout. Positive signs of impingement with positive Neer and Hawkin's tests, empty can sign. Speeds and Yergason's tests normal. No labral pathology noted with negative Obrien's, negative clunk and good stability. Positive crossover  Normal scapular function observed. No painful arc and no drop arm sign. No apprehension sign Contralateral shoulder unremarkable  Neck: Inspection unremarkable. No palpable stepoffs. Positive Spurling's maneuver with radiculopathyon left side Full neck range of motion Grip strength and sensation normal in bilateral hands Strength good C4 to T1 distribution No sensory change to C4 to T1 Negative Hoffman sign bilaterally Reflexes normal  MSK US performed of: left shoulder This study was ordered, performed, and interpreted by Charlann Boxer D.O.  Shoulder:   Supraspinatus:  Appears normal on long and transverse views, bursal bulge noted with increasing Doppler flow Infraspinatus:  Appears normal on long and transverse views. Subscapularis:  Appears normal on long and transverse views.bursal bulge noted with mild impingement signs  on impingement view Teres Minor:   Appears normal on long and transverse views. AC joint:  Mild arthritis Glenohumeral Joint:  Appears normal without effusion. Glenoid Labrum:  Intact without visualized tears. Biceps Tendon:  Appears normal on long and transverse views, no fraying of tendon, tendon located in intertubercular groove, no subluxation with shoulder internal or external rotation. No increased power doppler signal. Impression: Rotator cuff tendinopathy with subacromial bursitis  Procedure: Real-time Ultrasound Guided Injection of right glenohumeral joint Device: GE Logiq E  Ultrasound guided injection is preferred based studies that show increased duration, increased effect, greater accuracy, decreased procedural pain, increased response rate with ultrasound guided versus blind injection.  Verbal informed consent obtained.  Time-out conducted.  Noted no overlying erythema, induration, or other signs of local infection.  Skin prepped in a sterile fashion.  Local anesthesia: Topical Ethyl chloride.  With sterile technique and under real time ultrasound guidance:  Joint visualized.  23g 1  inch needle inserted posterior approach. Pictures taken for needle placement. Patient did have injection of 2 cc of 1% lidocaine, 2 cc of 0.5% Marcaine, and 1.0 cc of Kenalog 40 mg/dL. Completed without difficulty  Pain immediately resolved suggesting accurate placement of the medication.  Advised to call if fevers/chills, erythema, induration, drainage, or persistent bleeding.  Images permanently stored and available for review in the ultrasound unit.  Impression: Technically successful ultrasound guided injection.    Impression and Recommendations:     This case required medical decision making of moderate complexity.

## 2015-04-26 NOTE — Assessment & Plan Note (Signed)
Patient was given an injection. Patient tolerated the procedure very well. I do think that patient will also do well with the vitamin D and patient elected to try more once weekly than a daily dose. We discussed home exercises and over-the-counter natural supplementation. Patient does have oral anti-inflammatories if needed. Differential also includes potential cervical radiculopathy with patient having known moderate arthritis of the neck. Patient continues to have difficulty we may need to consider advanced imaging or an EMG with patient having difficulty with the whole left upper extremity. Patient follow-up with me in 3 weeks for further evaluation and treatment.  Spent  25 minutes with patient face-to-face and had greater than 50% of counseling including as described above in assessment and plan.

## 2015-04-26 NOTE — Patient Instructions (Addendum)
Good to see you.  Ice 20 minutes 2 times daily. Usually after activity and before bed. Exercises 3 times a week.  Turmeric 500mg  twice daily Vitamin D 50000IU weekly Try the topical medicine up to 2 times daily See me again in 3 weeks and if not better we will need to look at the neck.

## 2015-04-26 NOTE — Progress Notes (Signed)
Pre visit review using our clinic review tool, if applicable. No additional management support is needed unless otherwise documented below in the visit note. 

## 2015-05-09 ENCOUNTER — Ambulatory Visit (INDEPENDENT_AMBULATORY_CARE_PROVIDER_SITE_OTHER): Payer: PRIVATE HEALTH INSURANCE | Admitting: Family Medicine

## 2015-05-09 VITALS — BP 130/84 | HR 88 | Temp 98.5°F | Resp 16 | Ht 63.75 in | Wt 180.0 lb

## 2015-05-09 DIAGNOSIS — S66912S Strain of unspecified muscle, fascia and tendon at wrist and hand level, left hand, sequela: Secondary | ICD-10-CM

## 2015-05-09 NOTE — Progress Notes (Signed)
Subjective:  This chart was scribed for Katherine Haber MD,by Hatice Demirci,scribe, at [facility].  This patient was seen in room 2 and the patient's care was started at 8:21 AM.    Patient ID: Katherine Miller, female    DOB: 02-Aug-1956, 59 y.o.   MRN: [id number] Chief Complaint  Patient presents with   Follow-up    Left wrist, release for work    HPI  HPI Comments: ELLENI Miller is a 59 y.o. female who presents to the [facility] for a note to go back to work tomorrow.  Patient had recently injured her left wrist (Febuary 16th) while she was at work trying to keep a client from falling.  She has been taking medication (antiinflammatories, which is now finished) and states that it feels much better now.  Patient works at [employer].  She has no other complaints/concerns.  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 Authorizing Provider  cyclobenzaprine (FLEXERIL) 10 MG tablet Take 1 tablet (10 mg total) by mouth 3 (three) times daily as needed for muscle spasms. 04/07/15  Yes Shawnee Knapp, MD  meloxicam (MOBIC) 15 MG tablet Take 1 tablet (15 mg total) by mouth daily. 04/07/15  Yes Shawnee Knapp, MD  omeprazole (PRILOSEC) 20 MG capsule Take 20 mg by mouth 2 (two) times daily before a meal.   Yes Historical Provider, MD  Vitamin D, Ergocalciferol, (DRISDOL) 50000 UNITS CAPS capsule Take 1 capsule (50,000 Units total) by mouth every 7 (seven) days. 04/26/15  Yes Lyndal Pulley, DO  gabapentin (NEURONTIN) 300 MG capsule Take 1 capsule  (300 mg total) by mouth at bedtime. Patient not taking: Reported on 05/09/2015 04/07/15   Shawnee Knapp, MD  HYDROcodone-acetaminophen (NORCO/VICODIN) 5-325 MG per tablet Take 1-2 tablets by mouth every 4 (four) hours as needed. Patient not taking: Reported on 05/09/2015 03/22/15   Shawnee Knapp, MD  hydrOXYzine (ATARAX/VISTARIL) 25 MG tablet Take 1 tablet (25 mg total) by mouth every 8 (eight) hours as needed for anxiety. Patient not taking: Reported on 05/09/2015 12/28/13   Fransico Meadow, PA-C  Multiple Vitamin (MULTIVITAMIN WITH MINERALS) TABS Take 1 tablet by mouth daily.    Historical Provider, MD  ranitidine (ZANTAC) 150 MG tablet Take 1 tablet (150 mg total) by mouth 2 (two) times daily as needed for heartburn. Patient not taking: Reported on 05/09/2015 04/07/15   Shawnee Knapp, MD  zolpidem (AMBIEN) 10 MG tablet Take 1 tablet (10 mg total) by mouth at bedtime as needed for sleep. Patient not taking: Reported on 05/09/2015 08/28/13   Billy Fischer, MD   History   Social History   Marital Status: Single    Spouse Name: N/A   Number of Children: N/A   Years of Education: N/A   Occupational History   Not on file.   Social History Main Topics   Smoking status: Never Smoker    Smokeless tobacco: Not on file  Alcohol Use: No   Drug Use: No   Sexual Activity: Not Currently   Other Topics Concern   Not on file   Social History Narrative   Current Outpatient Prescriptions on File Prior to Visit  Medication Sig Dispense Refill   cyclobenzaprine (FLEXERIL) 10 MG tablet Take 1 tablet (10 mg total) by mouth 3 (three) times daily as needed for muscle spasms. 30 tablet 0   meloxicam (MOBIC) 15 MG tablet Take 1 tablet (15 mg total) by mouth daily. 30 tablet 1   omeprazole (PRILOSEC) 20 MG capsule Take 20 mg by mouth 2 (two) times daily before a meal.     Vitamin D, Ergocalciferol, (DRISDOL) 50000 UNITS CAPS capsule Take 1 capsule (50,000 Units total) by mouth every 7 (seven) days. 8 capsule  0   gabapentin (NEURONTIN) 300 MG capsule Take 1 capsule (300 mg total) by mouth at bedtime. (Patient not taking: Reported on 05/09/2015) 30 capsule 1   HYDROcodone-acetaminophen (NORCO/VICODIN) 5-325 MG per tablet Take 1-2 tablets by mouth every 4 (four) hours as needed. (Patient not taking: Reported on 05/09/2015) 40 tablet 0   hydrOXYzine (ATARAX/VISTARIL) 25 MG tablet Take 1 tablet (25 mg total) by mouth every 8 (eight) hours as needed for anxiety. (Patient not taking: Reported on 05/09/2015) 30 tablet 0   Multiple Vitamin (MULTIVITAMIN WITH MINERALS) TABS Take 1 tablet by mouth daily.     ranitidine (ZANTAC) 150 MG tablet Take 1 tablet (150 mg total) by mouth 2 (two) times daily as needed for heartburn. (Patient not taking: Reported on 05/09/2015) 60 tablet 0   zolpidem (AMBIEN) 10 MG tablet Take 1 tablet (10 mg total) by mouth at bedtime as needed for sleep. (Patient not taking: Reported on 05/09/2015) 30 tablet 0   No current facility-administered medications on file prior to visit.   No Known Allergies    Review of Systems  Constitutional: Negative for fever and chills.  Respiratory: Negative for cough and shortness of breath.   Gastrointestinal: Negative for nausea and vomiting.       Objective:   Physical Exam  Constitutional: She appears well-developed and well-nourished. No distress.  HENT:  Head: Normocephalic and atraumatic.  Eyes: Right eye exhibits no discharge. Left eye exhibits no discharge.  Pulmonary/Chest: Effort normal. No respiratory distress.  Neurological: She is alert. Coordination normal.  Skin: No rash noted. She is not diaphoretic.  Psychiatric: She has a normal mood and affect. Her behavior is normal.  Nursing note and vitals reviewed.   Filed Vitals:   05/09/15 0812  BP: 130/84  Pulse: 88  Temp: 98.5 F (36.9 C)  TempSrc: Oral  Resp: 16  Height: 5' 3.75" (1.619 m)  Weight: 180 lb (81.647 kg)  SpO2: 98%         Assessment & Plan:    Resolved wrist sprain This chart was scribed in my presence and reviewed by me personally.    ICD-9-CM ICD-10-CM   1. Wrist strain, left, sequela 905.7 S66.912S    Back to work note written  Signed, Katherine Haber, MD

## 2015-05-17 ENCOUNTER — Telehealth: Payer: Self-pay | Admitting: Family Medicine

## 2015-05-17 ENCOUNTER — Ambulatory Visit: Payer: PRIVATE HEALTH INSURANCE | Admitting: Family Medicine

## 2015-05-17 DIAGNOSIS — Z0289 Encounter for other administrative examinations: Secondary | ICD-10-CM

## 2015-05-17 NOTE — Telephone Encounter (Signed)
Patient no showed for fu.  Please advise.

## 2015-05-18 NOTE — Telephone Encounter (Signed)
Noted  

## 2015-05-30 ENCOUNTER — Telehealth: Payer: Self-pay | Admitting: Family Medicine

## 2015-05-30 NOTE — Telephone Encounter (Signed)
Patient is on the schedule for next Tuesday, but she is requesting to be seen this week for left shoulder pain.

## 2015-05-31 NOTE — Telephone Encounter (Signed)
lmovm for pt to return call.  

## 2015-05-31 NOTE — Telephone Encounter (Signed)
Spoke to pt, scheduled appt for 6.8.16 @ 10a.

## 2015-05-31 NOTE — Telephone Encounter (Signed)
Please call patient. She returned your call

## 2015-06-01 ENCOUNTER — Encounter: Payer: Self-pay | Admitting: Family Medicine

## 2015-06-01 ENCOUNTER — Ambulatory Visit (INDEPENDENT_AMBULATORY_CARE_PROVIDER_SITE_OTHER): Payer: PRIVATE HEALTH INSURANCE | Admitting: Family Medicine

## 2015-06-01 VITALS — BP 126/82 | HR 66 | Ht 63.75 in | Wt 184.0 lb

## 2015-06-01 DIAGNOSIS — M25512 Pain in left shoulder: Secondary | ICD-10-CM | POA: Diagnosis not present

## 2015-06-01 DIAGNOSIS — M75102 Unspecified rotator cuff tear or rupture of left shoulder, not specified as traumatic: Secondary | ICD-10-CM

## 2015-06-01 MED ORDER — KETOROLAC TROMETHAMINE 60 MG/2ML IM SOLN
60.0000 mg | Freq: Once | INTRAMUSCULAR | Status: AC
  Administered 2015-06-01: 60 mg via INTRAMUSCULAR

## 2015-06-01 MED ORDER — METHYLPREDNISOLONE ACETATE 80 MG/ML IJ SUSP
80.0000 mg | Freq: Once | INTRAMUSCULAR | Status: AC
  Administered 2015-06-01: 80 mg via INTRAMUSCULAR

## 2015-06-01 NOTE — Progress Notes (Signed)
Pre visit review using our clinic review tool, if applicable. No additional management support is needed unless otherwise documented below in the visit note. 

## 2015-06-01 NOTE — Patient Instructions (Addendum)
Good to see you  Ice is your friend xrays and ultrasound are normal. We need an mri and MRA to rule out labral tear of the left shoulder  Ice ice ice I will call you with the results

## 2015-06-01 NOTE — Progress Notes (Signed)
  CC: Left shoulder pain  HPI: Patient is a 58 year old right-hand-dominant female for recurrent shoulder pain. 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 Patient was last seen in April 2015.  Patient was given an injection at last exam one month ago. Patient was do conservative therapy including home exercises, icing protocol, and topical anti-inflammatory's. Patient states she has not made any significant improvement and if anything it is worsening. Starting to affect her job. Patient states that she attempted to go on short-term disability but this was denied secondary to not having the right paperwork. 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 Denies any limitation in range of motion. Not responding to medication she's tried.     Past medical, surgical, family and social history reviewed. Medications reviewed all in the electronic medical record.   Review of Systems: No headache, visual changes, nausea, vomiting, diarrhea, constipation, dizziness, abdominal pain, skin rash, fevers, chills, night sweats, weight loss, swollen lymph nodes, body aches, joint swelling, muscle aches, chest pain, shortness of breath, mood changes.   Objective:    Blood pressure 126/82, pulse 66, height 5' 3.75" (1.619 m), weight 184 lb (83.462 kg).   General: No apparent distress alert and oriented x3 mood and affect normal, dressed appropriately.  HEENT: Pupils equal, extraocular movements intact Respiratory: Patient's speak in full sentences and does not appear short of breath Cardiovascular: No lower extremity edema, non tender, no erythema Skin: Warm dry intact with no signs of infection or rash on extremities or on axial skeleton. Abdomen: Soft nontender Neuro: Cranial nerves II through XII are intact, neurovascularly intact in all extremities with 2+ DTRs and 2+ pulses. Lymph: No lymphadenopathy of  posterior or anterior cervical chain or axillae bilaterally.  Gait normal with good balance and coordination.  MSK: Non tender with full range of motion and good stability and symmetric strength and tone of elbows, wrist, hip, knee and ankles bilaterally.   Shoulder: left Inspection reveals no abnormalities, atrophy or asymmetry. Palpation i tenderness over AC joint but not  bicipital groove. ROM  is improved from previous visit Rotator cuff strength normal throughout. Positive signs of impingement with positive Neer and Hawkin's tests, empty can sign. Speeds and Yergason's tests normal. Positive O'Brien's which is a new finding. Normal scapular function observed. No painful arc and no drop arm sign. No apprehension sign Contralateral shoulder unremarkable  Neck: Inspection unremarkable. No palpable stepoffs. Diffuse tenderness of the neck but negative Spurling today. Full neck range of motion Grip strength and sensation normal in bilateral hands Strength good C4 to T1 distribution No sensory change to C4 to T1 Negative Hoffman sign bilaterally Reflexes normal        Impression and Recommendations:     This case required medical decision making of moderate complexity.

## 2015-06-01 NOTE — Assessment & Plan Note (Signed)
Patient did not respond to the injection as previous exam.  Patient's on ultrasound as well as x-ray does not have any significant bony abnormality but on exam today patient does have signs that could be more of a labral pathology. Patient has had difficulty doing daily activities as well as working secondary to this pain. 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 Patient is concerned of being able to make a living without being able to use her arm and would consider advanced imaging. With the concern for possible labral injury I do think an MRA with arthrogram would be beneficial. This was ordered today. We'll call patient with results and we'll discuss further treatment options.  Spent  25 minutes with patient face-to-face and had greater than 50% of counseling including as described above in assessment and plan.

## 2015-06-07 ENCOUNTER — Telehealth: Payer: Self-pay | Admitting: General Practice

## 2015-06-07 ENCOUNTER — Ambulatory Visit: Payer: PRIVATE HEALTH INSURANCE | Admitting: Family Medicine

## 2015-06-07 NOTE — Telephone Encounter (Signed)
Pt called in and said that he claustrophobic and wants to know if Dr Tamala Julian can give her something to help with the MRI?      [facility] on [address]   [phone]

## 2015-06-08 MED ORDER — DIAZEPAM 10 MG PO TABS: ORAL_TABLET | ORAL | Status: DC

## 2015-06-08 NOTE — Telephone Encounter (Signed)
rx sent into pharmacy

## 2015-06-17 ENCOUNTER — Ambulatory Visit
Admission: RE | Admit: 2015-06-17 | Discharge: 2015-06-17 | Disposition: A | Discharge status: Home / Self Care | Payer: PRIVATE HEALTH INSURANCE | Source: Ambulatory Visit | Attending: Family Medicine | Admitting: Family Medicine

## 2015-06-17 ENCOUNTER — Encounter: Payer: Self-pay | Admitting: *Deleted

## 2015-06-17 ENCOUNTER — Telehealth: Payer: Self-pay | Admitting: Family Medicine

## 2015-06-17 DIAGNOSIS — M25512 Pain in left shoulder: Secondary | ICD-10-CM

## 2015-06-17 DIAGNOSIS — M75102 Unspecified rotator cuff tear or rupture of left shoulder, not specified as traumatic: Secondary | ICD-10-CM

## 2015-06-17 MED ORDER — IOHEXOL 180 MG/ML  SOLN
13.0000 mL | Freq: Once | INTRAMUSCULAR | Status: AC | PRN
  Administered 2015-06-17: 13 mL via INTRA_ARTICULAR

## 2015-06-17 NOTE — Telephone Encounter (Signed)
She has an mri scheduled for today at 4pm.

## 2015-06-17 NOTE — Telephone Encounter (Signed)
Discussed with pt, letter left at front desk for her to come by & pick up.

## 2015-06-17 NOTE — Telephone Encounter (Signed)
I am sorry to hear that,  Fine on the note, awiating MRI if pain worsen ED.

## 2015-06-17 NOTE — Telephone Encounter (Signed)
Patient is in extreme pain in her shoulder. She called in from work last night, tonight and probably Sat night. She will need a doctors note and she is wondering if there is a pain patch

## 2015-06-20 ENCOUNTER — Telehealth: Payer: Self-pay | Admitting: Family Medicine

## 2015-06-20 NOTE — Telephone Encounter (Signed)
Patient is calling for MRI results. Advised of below notes, but patient states that this is something different?

## 2015-06-20 NOTE — Telephone Encounter (Signed)
Don't have time to call patient.  Please call her and tell her the MRI was perfect, there is no tear what so ever.

## 2015-06-20 NOTE — Telephone Encounter (Signed)
Discussed with pt

## 2015-06-20 NOTE — Telephone Encounter (Signed)
Patient is requesting call back in regards to MRI results.

## 2015-07-11 ENCOUNTER — Ambulatory Visit: Payer: PRIVATE HEALTH INSURANCE | Admitting: Family Medicine

## 2015-07-12 ENCOUNTER — Ambulatory Visit (INDEPENDENT_AMBULATORY_CARE_PROVIDER_SITE_OTHER): Payer: PRIVATE HEALTH INSURANCE | Admitting: Family Medicine

## 2015-07-12 ENCOUNTER — Encounter: Payer: Self-pay | Admitting: Family Medicine

## 2015-07-12 VITALS — BP 124/80 | HR 81 | Wt 185.0 lb

## 2015-07-12 DIAGNOSIS — G894 Chronic pain syndrome: Secondary | ICD-10-CM | POA: Diagnosis not present

## 2015-07-12 MED ORDER — VENLAFAXINE HCL ER 37.5 MG PO CP24: 37.5000 mg | ORAL_CAPSULE | Freq: Every day | ORAL | Status: DC

## 2015-07-12 MED ORDER — PREDNISONE 50 MG PO TABS: 50.0000 mg | ORAL_TABLET | Freq: Every day | ORAL | Status: DC

## 2015-07-12 NOTE — Patient Instructions (Addendum)
Ice is your friend MRI of shoulder is normal except for a little hook on the acromion We will do prednisone daily for 5 days  Effexor daily until I see you again.  See me again in 3 weeks to make sure you are responding.

## 2015-07-12 NOTE — Assessment & Plan Note (Signed)
Shins workup has been nonexistent to find any pathology that can be concerning to the amount of pain. We discussed different treatment options.  We discussed possibly that different treatment options exist including possible referral to rheumatology which patient declined. We also discussed that possible surgical intervention for the acromioclavicular arthrosis could be beneficial which patient also declined. We will try to treat as more of a chronic pain with Effexor. Patient given prednisone. Patient be out of work for the next 3 days and hopefully allowing her to rest will be beneficial. Patient will follow-up with me again in 2-3 weeks for further evaluation.  Spent  25 minutes with patient face-to-face and had greater than 50% of counseling including as described above in assessment and plan.

## 2015-07-12 NOTE — Progress Notes (Signed)
  CC: Left shoulder pain  HPI: Patient is a 59 year old right-hand-dominant female for recurrent shoulder pain. 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 Patient was last seen a month ago. Patient did have an MRI of the shoulder that only showed a mild type III acromion but otherwise no significant pathology. Patient did not have a labral tear and minimal tendinosis. Patient states she continues to have the discomfort. Patient states that he can stop her from doing the work. Patient states that she cannot do repetitive activity or help move individuals because of this pain. Patient does not understand why we cannot find anything on workup. Patient states that the pain is severe enough that it even hurts to light sensation. Denies any rash. Denies any fevers chills or any abnormal weight loss. Seems to be localized more on the left shoulder than truly neck.   Past medical, surgical, family and social history reviewed. Medications reviewed all in the electronic medical record.   Review of Systems: No headache, visual changes, nausea, vomiting, diarrhea, constipation, dizziness, abdominal pain, skin rash, fevers, chills, night sweats, weight loss, swollen lymph nodes, body aches, joint swelling, muscle aches, chest pain, shortness of breath, mood changes.   Objective:    Blood pressure 124/80, pulse 81, weight 185 lb (83.915 kg), SpO2 97 %.   General: No apparent distress alert and oriented x3 mood and affect normal, dressed appropriately.  HEENT: Pupils equal, extraocular movements intact Respiratory: Patient's speak in full sentences and does not appear short of breath Cardiovascular: No lower extremity edema, non tender, no erythema Skin: Warm dry intact with no signs of infection or rash on extremities or on axial skeleton. Abdomen: Soft nontender Neuro: Cranial nerves II through XII are intact, neurovascularly intact in all extremities with 2+  DTRs and 2+ pulses. Lymph: No lymphadenopathy of posterior or anterior cervical chain or axillae bilaterally.  Gait normal with good balance and coordination.  MSK: Non tender with full range of motion and good stability and symmetric strength and tone of elbows, wrist, hip, knee and ankles bilaterally.   Shoulder: left Inspection reveals no abnormalities, atrophy or asymmetry. Palpation i tenderness over AC joint but not  bicipital groove. ROM  is improved from previous visit Rotator cuff strength normal throughout. Continued positive impingement Speeds and Yergason's tests normal. Negative Normal scapular function observed. No painful arc and no drop arm sign. No apprehension sign Contralateral shoulder unremarkable   Neck: Inspection unremarkable. No palpable stepoffs. Diffuse tenderness still noted of the paraspinal musculature Full neck range of motion Grip strength and sensation normal in bilateral hands Strength good C4 to T1 distribution No sensory change to C4 to T1 Negative Hoffman sign bilaterally Reflexes normal        Impression and Recommendations:     This case required medical decision making of moderate complexity.

## 2015-07-13 ENCOUNTER — Ambulatory Visit: Payer: PRIVATE HEALTH INSURANCE | Admitting: Family Medicine

## 2015-08-21 ENCOUNTER — Emergency Department (HOSPITAL_COMMUNITY)
Admission: EM | Admit: 2015-08-21 | Discharge: 2015-08-21 | Disposition: A | Discharge status: Home / Self Care | Payer: PRIVATE HEALTH INSURANCE | Attending: Emergency Medicine | Admitting: Emergency Medicine

## 2015-08-21 ENCOUNTER — Encounter (HOSPITAL_COMMUNITY): Payer: Self-pay

## 2015-08-21 DIAGNOSIS — Z859 Personal history of malignant neoplasm, unspecified: Secondary | ICD-10-CM | POA: Insufficient documentation

## 2015-08-21 DIAGNOSIS — M542 Cervicalgia: Secondary | ICD-10-CM | POA: Diagnosis not present

## 2015-08-21 DIAGNOSIS — Z79899 Other long term (current) drug therapy: Secondary | ICD-10-CM | POA: Diagnosis not present

## 2015-08-21 DIAGNOSIS — Z862 Personal history of diseases of the blood and blood-forming organs and certain disorders involving the immune mechanism: Secondary | ICD-10-CM | POA: Insufficient documentation

## 2015-08-21 DIAGNOSIS — M25512 Pain in left shoulder: Secondary | ICD-10-CM | POA: Insufficient documentation

## 2015-08-21 DIAGNOSIS — R51 Headache: Secondary | ICD-10-CM | POA: Diagnosis not present

## 2015-08-21 MED ORDER — KETOROLAC TROMETHAMINE 60 MG/2ML IM SOLN
60.0000 mg | Freq: Once | INTRAMUSCULAR | Status: AC
  Administered 2015-08-21: 60 mg via INTRAMUSCULAR
  Filled 2015-08-21: qty 2

## 2015-08-21 MED ORDER — IBUPROFEN 600 MG PO TABS: 600.0000 mg | ORAL_TABLET | Freq: Four times a day (QID) | ORAL | Status: DC | PRN

## 2015-08-21 MED ORDER — METHOCARBAMOL 500 MG PO TABS: 500.0000 mg | ORAL_TABLET | Freq: Four times a day (QID) | ORAL | Status: DC | PRN

## 2015-08-21 NOTE — Discharge Instructions (Signed)
Read the information below.  Use the prescribed medication as directed.  Please discuss all new medications with your pharmacist.  You may return to the Emergency Department at any time for worsening condition or any new symptoms that concern you.    If you develop fevers, loss of control of bowel or bladder, weakness or numbness in your arms or legs, or are unable to walk, return to the ER for a recheck.   You are having a headache. No specific cause was found today for your headache. It may have been a migraine or other cause of headache. Stress, anxiety, fatigue, and depression are common triggers for headaches. Your headache today does not appear to be life-threatening or require hospitalization, but often the exact cause of headaches is not determined in the emergency department. Therefore, follow-up with your doctor is very important to find out what may have caused your headache, and whether or not you need any further diagnostic testing or treatment. Sometimes headaches can appear benign (not harmful), but then more serious symptoms can develop which should prompt an immediate re-evaluation by your doctor or the emergency department. SEEK MEDICAL ATTENTION IF: You develop possible problems with medications prescribed.  The medications don't resolve your headache, if it recurs , or if you have multiple episodes of vomiting or can't take fluids. You have a change from the usual headache. RETURN IMMEDIATELY IF you develop a sudden, severe headache or confusion, become poorly responsive or faint, develop a fever above 100.3F or problem breathing, have a change in speech, vision, swallowing, or understanding, or develop new weakness, numbness, tingling, incoordination, or have a seizure.

## 2015-08-21 NOTE — ED Notes (Signed)
Declined W/C at D/C and was escorted to lobby by RN. 

## 2015-08-21 NOTE — ED Notes (Signed)
Pt hurt left shoulder Feb 2016, pt has had MRI- negative, still having pain.   Onset several weeks ago pain has radiated to back of neck, stiff when turning neck side to side, cramps in back of neck.  Pt wants MRI done.

## 2015-08-21 NOTE — ED Provider Notes (Signed)
CSN: [id number]   Arrival date & time 08/21/15 1347  History  This chart was scribed for non-physician practitioner, Clayton Bibles PA-C, working with Merrily Pew, MD by Altamease Oiler, ED Scribe. This patient was seen in room TR06C/TR06C and the patient's care was started at 3:52 PM.  Chief Complaint  Patient presents with  . Neck Pain  . Shoulder Pain    HPI The history is provided by the patient. No language interpreter was used.   Katherine Miller is a 59 y.o. female who presents to the Emergency Department complaining of intermittent neck pain with onset 3 weeks ago. She describes the pain as stiffness, cramping, and like a spasm that is elicited with turning the neck.  Tylenol has provided insufficient pain relief. Pt has been having left sided shoulder and wrist pain since February after heavy lifting.The shoulder pain is now radiating to the neck. She was seen shortly after the injury in February and had a negative MRI. No new injury. Associated symptoms include intermittent headache. Pt denies extremity numbness, weakness, fever, chills, and incontinence of bowel or bladder.   Past Medical History  Diagnosis Date  . Bronchitis   . Allergy   . Anemia   . Cancer     Past Surgical History  Procedure Laterality Date  . Cholecystectomy      Family History  Problem Relation Age of Onset  . Cancer Brother     Social History  Substance Use Topics  . Smoking status: Never Smoker   . Smokeless tobacco: None  . Alcohol Use: No     Review of Systems  Constitutional: Negative for fever and chills.  Musculoskeletal: Positive for neck pain and neck stiffness. Negative for gait problem.       Left shoulder pain   Skin: Negative for color change.  Allergic/Immunologic: Negative for immunocompromised state.  Neurological: Positive for headaches. Negative for weakness and numbness.  Hematological: Does not bruise/bleed easily.  Psychiatric/Behavioral: Negative for self-injury.     Home Medications   Prior to Admission medications   Medication Sig Start Date End Date Taking? Authorizing Provider  cyclobenzaprine (FLEXERIL) 10 MG tablet Take 1 tablet (10 mg total) by mouth 3 (three) times daily as needed for muscle spasms. 04/07/15   Shawnee Knapp, MD  diazepam (VALIUM) 10 MG tablet Take 1 tablet 30 minutes before procedure. 06/08/15   Lyndal Pulley, DO  hydrOXYzine (ATARAX/VISTARIL) 25 MG tablet Take 1 tablet (25 mg total) by mouth every 8 (eight) hours as needed for anxiety. 12/28/13   Fransico Meadow, PA-C  Multiple Vitamin (MULTIVITAMIN WITH MINERALS) TABS Take 1 tablet by mouth daily.    Historical Provider, MD  omeprazole (PRILOSEC) 20 MG capsule Take 20 mg by mouth 2 (two) times daily before a meal.    Historical Provider, MD  predniSONE (DELTASONE) 50 MG tablet Take 1 tablet (50 mg total) by mouth daily. 07/12/15   Lyndal Pulley, DO  ranitidine (ZANTAC) 150 MG tablet Take 1 tablet (150 mg total) by mouth 2 (two) times daily as needed for heartburn. 04/07/15   Shawnee Knapp, MD  venlafaxine XR (EFFEXOR XR) 37.5 MG 24 hr capsule Take 1 capsule (37.5 mg total) by mouth daily with breakfast. 07/12/15   Lyndal Pulley, DO  Vitamin D, Ergocalciferol, (DRISDOL) 50000 UNITS CAPS capsule Take 1 capsule (50,000 Units total) by mouth every 7 (seven) days. 04/26/15   Lyndal Pulley, DO  zolpidem (AMBIEN) 10 MG tablet Take 1  tablet (10 mg total) by mouth at bedtime as needed for sleep. 08/28/13   Billy Fischer, MD    Allergies  Review of patient's allergies indicates no known allergies.  Triage Vitals: BP 107/71 mmHg  Pulse 70  Temp(Src) 98.8 F (37.1 C) (Oral)  Resp 14  Ht 5\' 3"  (1.6 m)  Wt 182 lb (82.555 kg)  BMI 32.25 kg/m2  SpO2 95%  Physical Exam  Constitutional: She appears well-developed and well-nourished. No distress.  HENT:  Head: Normocephalic and atraumatic.  Neck: Neck supple.  Pulmonary/Chest: Effort normal.  Musculoskeletal:       Back:  Spine  nontender, no crepitus, or stepoffs. Upper and lower extremities:  Strength 5/5, sensation intact, distal pulses intact.    Left posterior neck muscular tenderness and spasm.     Neurological: She is alert.  Skin: She is not diaphoretic.  Nursing note and vitals reviewed.   ED Course  Procedures  DIAGNOSTIC STUDIES: Oxygen Saturation is 95% on RA,  normal by my interpretation.    COORDINATION OF CARE: 3:58 PM Discussed treatment plan which includes Toradol with pt at bedside and pt agreed to plan.  Labs Review- Labs Reviewed - No data to display  Imaging Review No results found.  EKG Interpretation None      MDM   Final diagnoses:  Neck pain    Afebrile, nontoxic patient with 3 weeks of neck pain causing headaches.  She has chronic left arm pain from heavy lifting in 01/2015.  She is concerned her chronic left shoulder pain may be related to her neck.  She has tenderness and spasm of her left posterior neck musculature.  No bony tenderness.  Neurovascularly intact.  No red flags.  No new injury.  Do not believe emergent imaging is indicated at this time.  Toradol in ED.  D/C home with robaxin, orthopedic follow up.  PCP follow up.  Discussed result, findings, treatment, and follow up  with patient.  Pt given return precautions.  Pt verbalizes understanding and agrees with plan.        I personally performed the services described in this documentation, which was scribed in my presence. The recorded information has been reviewed and is accurate.      Clayton Bibles, PA-C 08/21/15 1613  Merrily Pew, MD 08/21/15 [phone]

## 2015-08-28 ENCOUNTER — Encounter (HOSPITAL_COMMUNITY): Payer: Self-pay

## 2015-08-28 DIAGNOSIS — Z85528 Personal history of other malignant neoplasm of kidney: Secondary | ICD-10-CM | POA: Insufficient documentation

## 2015-08-28 DIAGNOSIS — Z79899 Other long term (current) drug therapy: Secondary | ICD-10-CM | POA: Insufficient documentation

## 2015-08-28 DIAGNOSIS — R1012 Left upper quadrant pain: Secondary | ICD-10-CM | POA: Insufficient documentation

## 2015-08-28 DIAGNOSIS — Z862 Personal history of diseases of the blood and blood-forming organs and certain disorders involving the immune mechanism: Secondary | ICD-10-CM | POA: Insufficient documentation

## 2015-08-28 LAB — URINALYSIS, ROUTINE W REFLEX MICROSCOPIC
Bilirubin Urine: NEGATIVE
GLUCOSE, UA: NEGATIVE mg/dL
Hgb urine dipstick: NEGATIVE
KETONES UR: NEGATIVE mg/dL
LEUKOCYTES UA: NEGATIVE
NITRITE: NEGATIVE
PH: 6.5 (ref 5.0–8.0)
PROTEIN: NEGATIVE mg/dL
Specific Gravity, Urine: 1.004 — ABNORMAL LOW (ref 1.005–1.030)
Urobilinogen, UA: 0.2 mg/dL (ref 0.0–1.0)

## 2015-08-28 LAB — CBC
HEMATOCRIT: 35.8 % — AB (ref 36.0–46.0)
HEMOGLOBIN: 11.9 g/dL — AB (ref 12.0–15.0)
MCH: 28 pg (ref 26.0–34.0)
MCHC: 33.2 g/dL (ref 30.0–36.0)
MCV: 84.2 fL (ref 78.0–100.0)
PLATELETS: 301 10*3/uL (ref 150–400)
RBC: 4.25 MIL/uL (ref 3.87–5.11)
RDW: 13.2 % (ref 11.5–15.5)
WBC: 5.9 10*3/uL (ref 4.0–10.5)

## 2015-08-28 LAB — LIPASE, BLOOD: Lipase: 24 U/L (ref 22–51)

## 2015-08-28 NOTE — ED Notes (Signed)
Pt here for left lower abd pain for the past 2 weeks or so. Reports normal vaginal discharge. Denies N/V/D. Denies vaginal bleeding.

## 2015-08-29 ENCOUNTER — Emergency Department (HOSPITAL_COMMUNITY): Payer: PRIVATE HEALTH INSURANCE

## 2015-08-29 ENCOUNTER — Emergency Department (HOSPITAL_COMMUNITY)
Admission: EM | Admit: 2015-08-29 | Discharge: 2015-08-29 | Disposition: A | Discharge status: Home / Self Care | Payer: PRIVATE HEALTH INSURANCE | Attending: Emergency Medicine | Admitting: Emergency Medicine

## 2015-08-29 ENCOUNTER — Encounter (HOSPITAL_COMMUNITY): Payer: Self-pay | Admitting: Radiology

## 2015-08-29 DIAGNOSIS — R1012 Left upper quadrant pain: Secondary | ICD-10-CM

## 2015-08-29 LAB — COMPREHENSIVE METABOLIC PANEL
ALK PHOS: 115 U/L (ref 38–126)
ALT: 66 U/L — AB (ref 14–54)
AST: 45 U/L — ABNORMAL HIGH (ref 15–41)
Albumin: 3.8 g/dL (ref 3.5–5.0)
Anion gap: 9 (ref 5–15)
BUN: 10 mg/dL (ref 6–20)
CALCIUM: 8.9 mg/dL (ref 8.9–10.3)
CHLORIDE: 105 mmol/L (ref 101–111)
CO2: 24 mmol/L (ref 22–32)
CREATININE: 0.73 mg/dL (ref 0.44–1.00)
GFR calc non Af Amer: >60 mL/min (ref >60)
GLUCOSE: 108 mg/dL — AB (ref 65–99)
Potassium: 3.3 mmol/L — ABNORMAL LOW (ref 3.5–5.1)
Sodium: 138 mmol/L (ref 135–145)
Total Bilirubin: 0.3 mg/dL (ref 0.3–1.2)
Total Protein: 6.9 g/dL (ref 6.5–8.1)

## 2015-08-29 MED ORDER — IOHEXOL 300 MG/ML  SOLN
25.0000 mL | Freq: Once | INTRAMUSCULAR | Status: AC | PRN
  Administered 2015-08-29: 25 mL via ORAL

## 2015-08-29 MED ORDER — SUCRALFATE 1 G PO TABS: 1.0000 g | ORAL_TABLET | Freq: Three times a day (TID) | ORAL | Status: DC | PRN

## 2015-08-29 MED ORDER — IOHEXOL 300 MG/ML  SOLN
100.0000 mL | Freq: Once | INTRAMUSCULAR | Status: AC | PRN
Start: 2015-08-29 — End: 2015-08-29
  Administered 2015-08-29: 100 mL via INTRAVENOUS

## 2015-08-29 MED ORDER — OMEPRAZOLE 20 MG PO CPDR: 20.0000 mg | DELAYED_RELEASE_CAPSULE | Freq: Two times a day (BID) | ORAL | Status: DC

## 2015-08-29 MED ORDER — MORPHINE SULFATE (PF) 4 MG/ML IV SOLN
4.0000 mg | Freq: Once | INTRAVENOUS | Status: AC
  Administered 2015-08-29: 4 mg via INTRAVENOUS
  Filled 2015-08-29: qty 1

## 2015-08-29 NOTE — ED Provider Notes (Signed)
CSN: [id number]   Arrival date & time 08/28/15 2213  History  This chart was scribed for Julianne Rice, MD by Altamease Oiler, ED Scribe. This patient was seen in room A03C/A03C and the patient's care was started at 1:02 AM.  Chief Complaint  Patient presents with  . Abdominal Pain    HPI The history is provided by the patient. No language interpreter was used.   Katherine Miller is a 59 y.o. female with PMHx of kidney cancer who presents to the Emergency Department complaining of left lower abdominal pain with gradual onset 2 weeks ago. The pain was intermittent but has been constant over the last several days. The pain is described as sharp and intense. Pt denies pain radiation to the back. Pt denies nausea, vomiting, diarrhea, constipation, dysuria, increased urinary frequency, and abnormal vaginal discharge. Past surgical history is significant for abdominal hysterectomy. Pt states that in the distant past she had an issue with her left ovary. Her cancer was diagnosed 5 years ago and has required observation but not treatment.   Past Medical History  Diagnosis Date  . Bronchitis   . Allergy   . Anemia   . Cancer     kidney    Past Surgical History  Procedure Laterality Date  . Cholecystectomy    . Abdominal hysterectomy      Family History  Problem Relation Age of Onset  . Cancer Brother     Social History  Substance Use Topics  . Smoking status: Never Smoker   . Smokeless tobacco: None  . Alcohol Use: No     Review of Systems  Constitutional: Negative for fever and chills.  Respiratory: Negative for shortness of breath.   Cardiovascular: Negative for chest pain.  Gastrointestinal: Positive for abdominal pain. Negative for nausea, vomiting, diarrhea and constipation.  Genitourinary: Negative for dysuria, flank pain, vaginal bleeding, vaginal discharge and difficulty urinating.  Musculoskeletal: Negative for myalgias, back pain, neck pain and neck stiffness.  Skin:  Negative for rash and wound.  Neurological: Negative for dizziness, weakness, light-headedness and numbness.  All other systems reviewed and are negative.   Home Medications   Prior to Admission medications   Medication Sig Start Date End Date Taking? Authorizing Provider  diazepam (VALIUM) 10 MG tablet Take 1 tablet 30 minutes before procedure. 06/08/15   Lyndal Pulley, DO  hydrOXYzine (ATARAX/VISTARIL) 25 MG tablet Take 1 tablet (25 mg total) by mouth every 8 (eight) hours as needed for anxiety. 12/28/13   Fransico Meadow, PA-C  methocarbamol (ROBAXIN) 500 MG tablet Take 1-2 tablets (500-1,000 mg total) by mouth every 6 (six) hours as needed for muscle spasms. 08/21/15   Clayton Bibles, PA-C  Multiple Vitamin (MULTIVITAMIN WITH MINERALS) TABS Take 1 tablet by mouth daily.    Historical Provider, MD  omeprazole (PRILOSEC) 20 MG capsule Take 1 capsule (20 mg total) by mouth 2 (two) times daily before a meal. 08/29/15   Julianne Rice, MD  predniSONE (DELTASONE) 50 MG tablet Take 1 tablet (50 mg total) by mouth daily. 07/12/15   Lyndal Pulley, DO  ranitidine (ZANTAC) 150 MG tablet Take 1 tablet (150 mg total) by mouth 2 (two) times daily as needed for heartburn. 04/07/15   Shawnee Knapp, MD  sucralfate (CARAFATE) 1 G tablet Take 1 tablet (1 g total) by mouth 3 (three) times daily as needed. 08/29/15   Julianne Rice, MD  venlafaxine XR (EFFEXOR XR) 37.5 MG 24 hr capsule Take 1 capsule (37.5  mg total) by mouth daily with breakfast. 07/12/15   Lyndal Pulley, DO  Vitamin D, Ergocalciferol, (DRISDOL) 50000 UNITS CAPS capsule Take 1 capsule (50,000 Units total) by mouth every 7 (seven) days. 04/26/15   Lyndal Pulley, DO  zolpidem (AMBIEN) 10 MG tablet Take 1 tablet (10 mg total) by mouth at bedtime as needed for sleep. 08/28/13   Billy Fischer, MD    Allergies  Review of patient's allergies indicates no known allergies.  Triage Vitals: BP 164/95 mmHg  Pulse 72  Temp(Src) 98.1 F (36.7 C)  Resp 20  Ht 5'  3" (1.6 m)  Wt 185 lb (83.915 kg)  BMI 32.78 kg/m2  SpO2 96%  Physical Exam  Constitutional: She is oriented to person, place, and time. She appears well-developed and well-nourished. No distress.  HENT:  Head: Normocephalic and atraumatic.  Mouth/Throat: Oropharynx is clear and moist.  Eyes: EOM are normal. Pupils are equal, round, and reactive to light.  Neck: Normal range of motion. Neck supple.  Cardiovascular: Normal rate and regular rhythm.   Pulmonary/Chest: Effort normal and breath sounds normal. No respiratory distress. She has no wheezes. She has no rales.  Abdominal: Soft. Bowel sounds are normal. She exhibits no distension and no mass. There is tenderness (left upper and lower quadrant tenderness with palpation. No right-sided pelvic tenderness.). There is no rebound and no guarding.  Musculoskeletal: Normal range of motion. She exhibits no edema or tenderness.  No CVA tenderness bilaterally.  Neurological: She is alert and oriented to person, place, and time.  Skin: Skin is warm and dry. No rash noted. No erythema.  Psychiatric: She has a normal mood and affect. Her behavior is normal.  Nursing note and vitals reviewed.   ED Course  Procedures   DIAGNOSTIC STUDIES: Oxygen Saturation is 96% on RA, normal by my interpretation.    COORDINATION OF CARE: 1:07 AM Discussed treatment plan which includes lab work, CT A/P with contrast, and morphine with pt at bedside and pt agreed to plan.  Labs Reviewed  COMPREHENSIVE METABOLIC PANEL - Abnormal; Notable for the following:    Potassium 3.3 (*)    Glucose, Bld 108 (*)    AST 45 (*)    ALT 66 (*)    All other components within normal limits  CBC - Abnormal; Notable for the following:    Hemoglobin 11.9 (*)    HCT 35.8 (*)    All other components within normal limits  URINALYSIS, ROUTINE W REFLEX MICROSCOPIC (NOT AT [facility]) - Abnormal; Notable for the following:    Specific Gravity, Urine 1.004 (*)    All other  components within normal limits  LIPASE, BLOOD    I, No att. providers found, personally reviewed and evaluated these images and lab results as part of my medical decision-making.  Imaging Review Ct Abdomen Pelvis W Contrast  08/29/2015   CLINICAL DATA:  59 year old female with left lower quadrant abdominal pain x2 weeks.  EXAM: CT ABDOMEN AND PELVIS WITH CONTRAST  TECHNIQUE: Multidetector CT imaging of the abdomen and pelvis was performed using the standard protocol following bolus administration of intravenous contrast.  CONTRAST:  17mL OMNIPAQUE IOHEXOL 300 MG/ML  SOLN  COMPARISON:  Abdominal MRI dated 02/04/2015 and ultrasound dated 12/31/2014  FINDINGS: There are minimal bibasilar dependent atelectatic changes. The visualized lung bases are otherwise clear.  No intra-abdominal free air or free fluid.  Cholecystectomy. The liver, pancreas, spleen, and adrenal glands appear unremarkable. There are stable left renal cyst.  A 1.4 cm right renal cortical solid lesion is again noted similar to the prior MRI. There is no hydronephrosis on either side. The visualized ureters and the urinary bladder appear unremarkable. Hysterectomy. There is a 3.9 cm right adnexal cystic lesion. Ultrasound recommended for better evaluation.  Oral contrast noted within the stomach. There is apparent segmental thickening of the pylorus and first portion of the duodenum. This may be related to underdistention or secondary to an inflammatory process or scarring related to postsurgical changes. Oral contrast however is noted within the small bowel. There is no evidence of bowel obstruction. There is moderate stool throughout the colon. The appendix is not visualized with certainty. No inflammatory changes identified in the right lower quadrant.  The abdominal aorta and IVC appear unremarkable. There is no retroperitoneal lymphadenopathy. Top-normal right lower quadrant and pericecal lymph nodes. The osseous structures appear intact.   IMPRESSION: A 1.4 cm right renal cortical solid lesion as described on the prior MRI concerning for neoplasm. Stable appearing left renal cysts.  A 3.9 cm right adnexal cystic lesion. Nonemergent pelvic ultrasound is recommended for further evaluation.  No evidence of bowel obstruction or inflammation.  Apparent segmental thickening of the pylorus and first portion of the duodenum may be related to underdistention or scarring.   Electronically Signed   By: Anner Crete M.D.   On: 08/29/2015 03:26         MDM   Final diagnoses:  Left upper quadrant pain     I personally performed the services described in this documentation, which was scribed in my presence. The recorded information has been reviewed and is accurate.  Patient appears very comfortable. Again no tenderness with palpation on the right. Patient does admit to ingesting high doses of ibuprofen recently. Suspect symptoms may be due to gastritis. Low suspicion for ovarian torsion.  Patient's symptoms have improved. Given return precautions. Advised follow-up with gastroenterology.   Julianne Rice, MD 08/30/15 [phone]

## 2015-08-29 NOTE — Discharge Instructions (Signed)
Abdominal Pain, Women °Abdominal (stomach, pelvic, or belly) pain can be caused by many things. It is important to tell your doctor: °· The location of the pain. °· Does it come and go or is it present all the time? °· Are there things that start the pain (eating certain foods, exercise)? °· Are there other symptoms associated with the pain (fever, nausea, vomiting, diarrhea)? °All of this is helpful to know when trying to find the cause of the pain. °CAUSES  °· Stomach: virus or bacteria infection, or ulcer. °· Intestine: appendicitis (inflamed appendix), regional ileitis (Crohn's disease), ulcerative colitis (inflamed colon), irritable bowel syndrome, diverticulitis (inflamed diverticulum of the colon), or cancer of the stomach or intestine. °· Gallbladder disease or stones in the gallbladder. °· Kidney disease, kidney stones, or infection. °· Pancreas infection or cancer. °· Fibromyalgia (pain disorder). °· Diseases of the female organs: °¨ Uterus: fibroid (non-cancerous) tumors or infection. °¨ Fallopian tubes: infection or tubal pregnancy. °¨ Ovary: cysts or tumors. °¨ Pelvic adhesions (scar tissue). °¨ Endometriosis (uterus lining tissue growing in the pelvis and on the pelvic organs). °¨ Pelvic congestion syndrome (female organs filling up with blood just before the menstrual period). °¨ Pain with the menstrual period. °¨ Pain with ovulation (producing an egg). °¨ Pain with an IUD (intrauterine device, birth control) in the uterus. °¨ Cancer of the female organs. °· Functional pain (pain not caused by a disease, may improve without treatment). °· Psychological pain. °· Depression. °DIAGNOSIS  °Your doctor will decide the seriousness of your pain by doing an examination. °· Blood tests. °· X-rays. °· Ultrasound. °· CT scan (computed tomography, special type of X-ray). °· MRI (magnetic resonance imaging). °· Cultures, for infection. °· Barium enema (dye inserted in the large intestine, to better view it with  X-rays). °· Colonoscopy (looking in intestine with a lighted tube). °· Laparoscopy (minor surgery, looking in abdomen with a lighted tube). °· Major abdominal exploratory surgery (looking in abdomen with a large incision). °TREATMENT  °The treatment will depend on the cause of the pain.  °· Many cases can be observed and treated at home. °· Over-the-counter medicines recommended by your caregiver. °· Prescription medicine. °· Antibiotics, for infection. °· Birth control pills, for painful periods or for ovulation pain. °· Hormone treatment, for endometriosis. °· Nerve blocking injections. °· Physical therapy. °· Antidepressants. °· Counseling with a psychologist or psychiatrist. °· Minor or major surgery. °HOME CARE INSTRUCTIONS  °· Do not take laxatives, unless directed by your caregiver. °· Take over-the-counter pain medicine only if ordered by your caregiver. Do not take aspirin because it can cause an upset stomach or bleeding. °· Try a clear liquid diet (broth or water) as ordered by your caregiver. Slowly move to a bland diet, as tolerated, if the pain is related to the stomach or intestine. °· Have a thermometer and take your temperature several times a day, and record it. °· Bed rest and sleep, if it helps the pain. °· Avoid sexual intercourse, if it causes pain. °· Avoid stressful situations. °· Keep your follow-up appointments and tests, as your caregiver orders. °· If the pain does not go away with medicine or surgery, you may try: °¨ Acupuncture. °¨ Relaxation exercises (yoga, meditation). °¨ Group therapy. °¨ Counseling. °SEEK MEDICAL CARE IF:  °· You notice certain foods cause stomach pain. °· Your home care treatment is not helping your pain. °· You need stronger pain medicine. °· You want your IUD removed. °· You feel faint or   lightheaded. °· You develop nausea and vomiting. °· You develop a rash. °· You are having side effects or an allergy to your medicine. °SEEK IMMEDIATE MEDICAL CARE IF:  °· Your  pain does not go away or gets worse. °· You have a fever. °· Your pain is felt only in portions of the abdomen. The right side could possibly be appendicitis. The left lower portion of the abdomen could be colitis or diverticulitis. °· You are passing blood in your stools (bright red or black tarry stools, with or without vomiting). °· You have blood in your urine. °· You develop chills, with or without a fever. °· You pass out. °MAKE SURE YOU:  °· Understand these instructions. °· Will watch your condition. °· Will get help right away if you are not doing well or get worse. °Document Released: 10/07/2007 Document Revised: 04/26/2014 Document Reviewed: 10/27/2009 °[employer]® Patient Information ©2015 [employer]. This information is not intended to replace advice given to you by your health care provider. Make sure you discuss any questions you have with your health care provider. ° °

## 2015-08-29 NOTE — ED Notes (Signed)
Pt off unit with CT 

## 2015-09-07 ENCOUNTER — Other Ambulatory Visit: Payer: Self-pay | Admitting: Physician Assistant

## 2015-09-07 DIAGNOSIS — M542 Cervicalgia: Secondary | ICD-10-CM

## 2015-09-09 ENCOUNTER — Ambulatory Visit
Admission: RE | Admit: 2015-09-09 | Discharge: 2015-09-09 | Disposition: A | Discharge status: Home / Self Care | Payer: PRIVATE HEALTH INSURANCE | Source: Ambulatory Visit | Attending: Physician Assistant | Admitting: Physician Assistant

## 2015-09-09 DIAGNOSIS — M542 Cervicalgia: Secondary | ICD-10-CM

## 2015-09-15 ENCOUNTER — Ambulatory Visit: Payer: PRIVATE HEALTH INSURANCE | Attending: Physician Assistant | Admitting: Physical Therapy

## 2015-09-15 DIAGNOSIS — M6248 Contracture of muscle, other site: Secondary | ICD-10-CM | POA: Insufficient documentation

## 2015-09-15 DIAGNOSIS — M5382 Other specified dorsopathies, cervical region: Secondary | ICD-10-CM | POA: Insufficient documentation

## 2015-09-15 DIAGNOSIS — M62838 Other muscle spasm: Secondary | ICD-10-CM

## 2015-09-15 DIAGNOSIS — M542 Cervicalgia: Secondary | ICD-10-CM

## 2015-09-15 DIAGNOSIS — R293 Abnormal posture: Secondary | ICD-10-CM | POA: Insufficient documentation

## 2015-09-15 NOTE — Therapy (Addendum)
[place], [place], [address] Phone: [phone]   Fax:  [phone]  Physical Therapy Evaluation  Patient Details  Name: Katherine Miller MRN: [id number] Date of Birth: 1956/10/10 Referring Provider:  Pete Pelt, PA-C  Encounter Date: 09/15/2015      PT End of Session - 09/15/15 1423    Visit Number 1   Number of Visits 16   Date for PT Re-Evaluation 11/10/15   PT Start Time 1330   PT Stop Time 1415   PT Time Calculation (min) 45 min   Activity Tolerance Patient tolerated treatment well   Behavior During Therapy [facility] for tasks assessed/performed      Past Medical History  Diagnosis Date  . Bronchitis   . Allergy   . Anemia   . Cancer     kidney    Past Surgical History  Procedure Laterality Date  . Cholecystectomy    . Abdominal hysterectomy      There were no vitals filed for this visit.  Visit Diagnosis:  Neck pain - Plan: PT plan of care cert/re-cert  Muscle spasms of head or neck - Plan: PT plan of care cert/re-cert  Decreased ROM of intervertebral discs of cervical spine - Plan: PT plan of care cert/re-cert  Abnormal posture - Plan: PT plan of care cert/re-cert      Subjective Assessment - 09/15/15 1341    Subjective pt is a 59 y.o F with CC of neck pain that started a couple of weeks ago. She reports in Feburaryshe sprained her wrist  having pain and numbness in the hand, and thinks its coming from. she reports the pain started insidiously. since onset she reports the symptoms have stayed the same.    Limitations Lifting   How long can you sit comfortably? unlimited (as long as not sitting and doing activities)   How long can you stand comfortably? unlimited (as long as no lifting/pulling)   How long can you walk comfortably? pt reports unsure "I hadn't walked in a while"   Diagnostic tests MRI 09/09/2015 pt reported bone spurs   Patient Stated Goals to not have surgery, to  be pain free and get back to 100%   Currently in Pain? Yes   Pain Score 5    Pain Location Neck   Pain Orientation Left   Pain Descriptors / Indicators Nagging;Aching;Pins and needles   Pain Type Chronic pain   Pain Radiating Towards referring to the wrist of the hand   Pain Onset More than a month ago   Pain Frequency Intermittent   Aggravating Factors  lifting and pulling, carrying   Pain Relieving Factors muscle relaxer and laying down.    Multiple Pain Sites Yes   Pain Score 5  with lifting/pulling carrying 10/10   Pain Location Shoulder   Pain Orientation Left   Pain Descriptors / Indicators Aching   Pain Type Chronic pain   Pain Onset More than a month ago   Pain Frequency Intermittent   Aggravating Factors  lifting and pulling/carrying   Pain Relieving Factors muscle relaxer and laying down            [facility] PT Assessment - 09/15/15 1352    Assessment   Medical Diagnosis cervicalgia   Onset Date/Surgical Date --  couple of weeks   Hand Dominance Right   Next MD Visit pt reports not being not sure   Prior Therapy no   Precautions   Precautions  None;Other (comment)  no lifting over 5-10#   Restrictions   Weight Bearing Restrictions No   Balance Screen   Has the patient fallen in the past 6 months No   Has the patient had a decrease in activity level because of a fear of falling?  No   Is the patient reluctant to leave their home because of a fear of falling?  No   Home Environment   Living Environment Private residence   Available Help at Discharge Family   Type of [place] Access Level entry   [place] One level   Prior Function   Level of Independence Independent;Independent with basic ADLs   Vocation Part time employment;Other (comment);Student  CNA , and in school for prerequesists for nursing   Vocation Requirements prolong lifting, standing, walking   Leisure hanging out with family   Cognition   Overall Cognitive Status Within  Functional Limits for tasks assessed   Observation/Other Assessments   Focus on Therapeutic Outcomes (FOTO)  54% limited  predicted 37%    Posture/Postural Control   Posture/Postural Control Postural limitations   Postural Limitations Rounded Shoulders;Forward head   ROM / Strength   AROM / PROM / Strength AROM;PROM;Strength   AROM   AROM Assessment Site Cervical;Shoulder   Right/Left Shoulder Right;Left   Cervical Flexion 30  pain during movement   Cervical Extension 35  pain during movmeent   Cervical - Right Side Bend 48  pain at end range   Cervical - Left Side Bend 50   pain at endrange   Cervical - Right Rotation 40  pain at endrange   Cervical - Left Rotation 45  pain at endrange   PROM   PROM Assessment Site Shoulder   Strength   Strength Assessment Site Shoulder;Hand   Right/Left Shoulder Right;Left   Right/Left hand Right;Left   Palpation   Spinal mobility hypomobility of C3-C7 with tendenress upon palpation   Palpation comment  tendereness and  tightness of the levator scap, upper traps and sub-occipitals   Special Tests    Special Tests Cervical   Cervical Tests Spurling's;Dictraction;Vertebral Artery Test;other   Spurling's   Findings Positive   Side Left   Distraction Test   Findngs Positive   Vertebral Artery Test    Findings Negative   other    Comment ULTT  Median                           PT Education - 09/15/15 1423    Education provided Yes   Education Details evaluation findings, POC, Goals, HEP, cervical anatomy   Person(s) Educated Patient   Methods Explanation   Comprehension Verbalized understanding          PT Short Term Goals - 09/15/15 1431    PT SHORT TERM GOAL #1   Title pt will be I with inital HEP (10/15/2015)   Time 4   Period Weeks   Status New   PT SHORT TERM GOAL #2   Title pt will increase cervical mobility by > 10 degrees in all planes to assist with funcitonal progression(10/15/2015)   Time 4    Period Weeks   Status New   PT SHORT TERM GOAL #3   Title She will exhibit decreased upper trap, levator scapuale tightness to assist with improved cerivical mobility (10/15/2015)   Time 4   Period Weeks   Status New   PT SHORT TERM GOAL #  4   Title She will be able to verbalize and demonstrate techniques to reduce neck pain and reinjury via postural awareness, lifting and carrying mechanics, and HEP (10/15/2015)   Time 4   Period Weeks   Status New           PT Long Term Goals - 09/15/15 1433    PT LONG TERM GOAL #1   Title pt will be I with all HEP given throughout therapy (11/10/2015)   Time 8   Status New   PT LONG TERM GOAL #2   Title pt will demonstrate functional cervical AROM to [facility] to assist with safety during driving (07/68/0881)   Time 8   Period Weeks   Status New   PT LONG TERM GOAL #3   Title pt will be able to lift and carry > 10-25 # to shoulder height/overhead with < 2/10 pain to promote lifting and carry activities (11/10/2015)   Time 8   Period Weeks   Status New   PT LONG TERM GOAL #4   Title pt will be able to don/doff her book bag and carry it for > 30 minutes to assist with school related activities (11/10/2015)   Time 8   Period Weeks   Status New   PT LONG TERM GOAL #5   Title pt will increase her FOTO score to > 63% to demonstrate improved function at discharge (11/10/2015)   Time [place] - 09/15/15 1424    Clinical Impression Statement Katherine Miller presents to OPPT with CC of neck pain that started a couple of weeks ago. She states she htings it came from spraining her wrist back in [place] and has some intermittent pain radiate to the wrist/hand. palpation reveals tendereness and  tightness of the levator scap, upper traps and sub-occipitals. She dmeonstrates limited AROM of the neck with pain noted at all end ranges. She reports pain in the shoulder and weakness. Specail testing was positive for  spurlings, disstraction, and upper limb tension which is positive finding for cervical radiculopathy.  Pt would benefit from physical therapy to decreased pain and improve function by addressing the impairments listed.    Pt will benefit from skilled therapeutic intervention in order to improve on the following deficits Decreased activity tolerance;Decreased endurance;Postural dysfunction;Improper body mechanics;Pain;Decreased strength;Decreased mobility;Decreased range of motion;Increased muscle spasms   Rehab Potential Good   PT Frequency 2x / week   PT Duration 6 weeks   PT Treatment/Interventions ADLs/Self Care Home Management;Cryotherapy;Electrical Stimulation;Iontophoresis 31m/ml Dexamethasone;Moist Heat;Ultrasound;Therapeutic activities;Therapeutic exercise;Patient/family education;Manual techniques;Passive range of motion;Dry needling;Taping;Traction   PT Next Visit Plan assess response to HEP, manual for muscle spasm, cerival mobs. nerve glides, traction, shoulder mobility?   PT Home Exercise Plan median nerve mobs, upper trap and levator scap stretch, deep neck flexors, upper cervical rotation,    Consulted and Agree with Plan of Care Patient         Problem List Patient Active Problem List   Diagnosis Date Noted  . Chronic pain syndrome 07/12/2015  . Rotator cuff syndrome of left shoulder 04/26/2015  . Neck pain 01/12/2014  . 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 Rotator cuff tear 10/08/2013   KStarr LakePT, DPT, LAT, ATC  09/15/2015  2:41 PM      [facility] [address]Phone: [phone]  Fax:  [phone]    PHYSICAL THERAPY DISCHARGE SUMMARY  Visits from Start of Care: 1  Current functional level related to goals / functional outcomes: See goals   Remaining deficits: As of last visit due to pt not returning for more visits. Neck and shoulder tightness with limited  mobility and pain.    Education / Equipment: HEP  Plan: Patient agrees to discharge.  Patient goals were not met. Patient is being discharged due to not returning since the last visit.  ?????        Kristoffer Leamon PT, DPT, LAT, ATC  11/02/2015  8:12 AM

## 2015-09-15 NOTE — Patient Instructions (Signed)
   Kristoffer Leamon PT, DPT, LAT, ATC  [facility] Phone: [phone]     

## 2015-11-04 ENCOUNTER — Emergency Department (HOSPITAL_COMMUNITY)
Admission: EM | Admit: 2015-11-04 | Discharge: 2015-11-04 | Disposition: A | Discharge status: Home / Self Care | Payer: PRIVATE HEALTH INSURANCE | Attending: Emergency Medicine | Admitting: Emergency Medicine

## 2015-11-04 ENCOUNTER — Encounter (HOSPITAL_COMMUNITY): Payer: Self-pay

## 2015-11-04 DIAGNOSIS — Z862 Personal history of diseases of the blood and blood-forming organs and certain disorders involving the immune mechanism: Secondary | ICD-10-CM | POA: Insufficient documentation

## 2015-11-04 DIAGNOSIS — Z85528 Personal history of other malignant neoplasm of kidney: Secondary | ICD-10-CM | POA: Insufficient documentation

## 2015-11-04 DIAGNOSIS — N898 Other specified noninflammatory disorders of vagina: Secondary | ICD-10-CM

## 2015-11-04 DIAGNOSIS — R1030 Lower abdominal pain, unspecified: Secondary | ICD-10-CM

## 2015-11-04 LAB — URINALYSIS, ROUTINE W REFLEX MICROSCOPIC
BILIRUBIN URINE: NEGATIVE
Glucose, UA: NEGATIVE mg/dL
Hgb urine dipstick: NEGATIVE
KETONES UR: 15 mg/dL — AB
LEUKOCYTES UA: NEGATIVE
NITRITE: NEGATIVE
Protein, ur: NEGATIVE mg/dL
UROBILINOGEN UA: 0.2 mg/dL (ref 0.0–1.0)
pH: 6 (ref 5.0–8.0)

## 2015-11-04 LAB — CBC
HEMATOCRIT: 36.3 % (ref 36.0–46.0)
Hemoglobin: 12.2 g/dL (ref 12.0–15.0)
MCH: 28.4 pg (ref 26.0–34.0)
MCHC: 33.6 g/dL (ref 30.0–36.0)
MCV: 84.4 fL (ref 78.0–100.0)
PLATELETS: 294 10*3/uL (ref 150–400)
RBC: 4.3 MIL/uL (ref 3.87–5.11)
RDW: 13.9 % (ref 11.5–15.5)
WBC: 6.2 10*3/uL (ref 4.0–10.5)

## 2015-11-04 LAB — COMPREHENSIVE METABOLIC PANEL
ALT: 52 U/L (ref 14–54)
AST: 38 U/L (ref 15–41)
Albumin: 3.9 g/dL (ref 3.5–5.0)
Alkaline Phosphatase: 106 U/L (ref 38–126)
Anion gap: 11 (ref 5–15)
BILIRUBIN TOTAL: 0.2 mg/dL — AB (ref 0.3–1.2)
BUN: 10 mg/dL (ref 6–20)
CHLORIDE: 108 mmol/L (ref 101–111)
CO2: 22 mmol/L (ref 22–32)
Calcium: 9 mg/dL (ref 8.9–10.3)
Creatinine, Ser: 0.93 mg/dL (ref 0.44–1.00)
Glucose, Bld: 84 mg/dL (ref 65–99)
POTASSIUM: 3.7 mmol/L (ref 3.5–5.1)
Sodium: 141 mmol/L (ref 135–145)
TOTAL PROTEIN: 6.9 g/dL (ref 6.5–8.1)

## 2015-11-04 LAB — LIPASE, BLOOD: LIPASE: 27 U/L (ref 11–51)

## 2015-11-04 LAB — WET PREP, GENITAL
TRICH WET PREP: NONE SEEN
YEAST WET PREP: NONE SEEN

## 2015-11-04 MED ORDER — NAPROXEN 500 MG PO TABS: 500.0000 mg | ORAL_TABLET | Freq: Two times a day (BID) | ORAL | Status: DC

## 2015-11-04 MED ORDER — METRONIDAZOLE 0.75 % VA GEL: 1.0000 | Freq: Two times a day (BID) | VAGINAL | Status: DC

## 2015-11-04 NOTE — Discharge Instructions (Signed)
Take naproxen as prescribed for pain. Use MetroGel as prescribed for discharge. Follow-up with a primary doctor or OB/GYN for reevaluation of symptoms. Return to the emergency department or the [facility] ED for further evaluation if symptoms worsen.  Bacterial Vaginosis Bacterial vaginosis is a vaginal infection that occurs when the normal balance of bacteria in the vagina is disrupted. It results from an overgrowth of certain bacteria. This is the most common vaginal infection in women of childbearing age. Treatment is important to prevent complications, especially in pregnant women, as it can cause a premature delivery. CAUSES  Bacterial vaginosis is caused by an increase in harmful bacteria that are normally present in smaller amounts in the vagina. Several different kinds of bacteria can cause bacterial vaginosis. However, the reason that the condition develops is not fully understood. RISK FACTORS Certain activities or behaviors can put you at an increased risk of developing bacterial vaginosis, including:  Having a new sex partner or multiple sex partners.  Douching.  Using an intrauterine device (IUD) for contraception. Women do not get bacterial vaginosis from toilet seats, bedding, swimming pools, or contact with objects around them. SIGNS AND SYMPTOMS  Some women with bacterial vaginosis have no signs or symptoms. Common symptoms include:  Grey vaginal discharge.  A fishlike odor with discharge, especially after sexual intercourse.  Itching or burning of the vagina and vulva.  Burning or pain with urination. DIAGNOSIS  Your health care provider will take a medical history and examine the vagina for signs of bacterial vaginosis. A sample of vaginal fluid may be taken. Your health care provider will look at this sample under a microscope to check for bacteria and abnormal cells. A vaginal pH test may also be done.  TREATMENT  Bacterial vaginosis may be treated with  antibiotic medicines. These may be given in the form of a pill or a vaginal cream. A second round of antibiotics may be prescribed if the condition comes back after treatment. Because bacterial vaginosis increases your risk for sexually transmitted diseases, getting treated can help reduce your risk for chlamydia, gonorrhea, HIV, and herpes. HOME CARE INSTRUCTIONS   Only take over-the-counter or prescription medicines as directed by your health care provider.  If antibiotic medicine was prescribed, take it as directed. Make sure you finish it even if you start to feel better.  Tell all sexual partners that you have a vaginal infection. They should see their health care provider and be treated if they have problems, such as a mild rash or itching.  During treatment, it is important that you follow these instructions:  Avoid sexual activity or use condoms correctly.  Do not douche.  Avoid alcohol as directed by your health care provider.  Avoid breastfeeding as directed by your health care provider. SEEK MEDICAL CARE IF:   Your symptoms are not improving after 3 days of treatment.  You have increased discharge or pain.  You have a fever. MAKE SURE YOU:   Understand these instructions.  Will watch your condition.  Will get help right away if you are not doing well or get worse. FOR MORE INFORMATION  Centers for Disease Control and Prevention, Division of STD Prevention: AppraiserFraud.fi American Sexual Health Association (ASHA): www.ashastd.org    This information is not intended to replace advice given to you by your health care provider. Make sure you discuss any questions you have with your health care provider.   Document Released: 12/10/2005 Document Revised: 12/31/2014 Document Reviewed: 07/22/2013 Elsevier Interactive Patient Education  Education ©2016 Elsevier Inc. ° °

## 2015-11-04 NOTE — ED Notes (Signed)
Pt c/o lower abd cramps that started bothering her earlier this week. Denies being on her menstrual cycle. Has a hx of partial hysterectomy. Noticed some vaginal discharge a few days ago and no odor. Was with her ex Sunday night and didn't start feeling funny till Tuesday night. Having some nausea and headaches.

## 2015-11-04 NOTE — ED Provider Notes (Signed)
CSN: [id number]     Arrival date & time 11/04/15  2015 History   First MD Initiated Contact with Patient 11/04/15 2145     Chief Complaint  Patient presents with  . Abdominal Pain     (Consider location/radiation/quality/duration/timing/severity/associated sxs/prior Treatment) HPI Comments: 59 year old female with a history of bronchitis and anemia presents to the emergency department for further evaluation of abdominal pain. Patient states that abdominal pain began 2 days ago. It is intermittent and described as a cramping, sharp pain in her lower abdomen. Symptoms preceded by vaginal discharge which has been white and odorless. She also reports associated nausea, chills, and headache. Patient believes that her symptoms are associated to a sexual encounter with her ex. She denies the use of protection during sexual intercourse and she reports her "body is very sensitive". Patient has had no dysuria, vomiting, or diarrhea. Abdominal surgical history significant for a partial hysterectomy as well as a cholecystectomy. Patient states that she is not currently followed by an OB/GYN. No medications taken prior to arrival for symptoms.  Patient is a 59 y.o. female presenting with abdominal pain. The history is provided by the patient. No language interpreter was used.  Abdominal Pain Associated symptoms: chills, fever (subjective), nausea and vaginal discharge   Associated symptoms: no diarrhea, no dysuria and no vomiting     Past Medical History  Diagnosis Date  . Bronchitis   . Allergy   . Anemia   . Cancer [facility])     kidney   Past Surgical History  Procedure Laterality Date  . Cholecystectomy    . Abdominal hysterectomy     Family History  Problem Relation Age of Onset  . Cancer Brother    Social History  Substance Use Topics  . Smoking status: Never Smoker   . Smokeless tobacco: None  . Alcohol Use: No   OB History    No data available      Review of Systems   Constitutional: Positive for fever (subjective) and chills.  Gastrointestinal: Positive for nausea and abdominal pain. Negative for vomiting and diarrhea.  Genitourinary: Positive for vaginal discharge. Negative for dysuria.  Neurological: Positive for headaches.  All other systems reviewed and are negative.   Allergies  Review of patient's allergies indicates no known allergies.  Home Medications   Prior to Admission medications   Medication Sig Start Date End Date Taking? Authorizing Provider  metroNIDAZOLE (METROGEL VAGINAL) 0.75 % vaginal gel Place 1 Applicatorful vaginally 2 (two) times daily. Use as instructed for 1 week. 11/04/15   Antonietta Breach, PA-C  naproxen (NAPROSYN) 500 MG tablet Take 1 tablet (500 mg total) by mouth 2 (two) times daily. 11/04/15   Antonietta Breach, PA-C   BP 128/67 mmHg  Pulse 78  Temp(Src) 98.2 F (36.8 C) (Oral)  Resp 16  Ht 5\' 3"  (1.6 m)  Wt 186 lb 1.6 oz (84.414 kg)  BMI 32.97 kg/m2  SpO2 98%   Physical Exam  Constitutional: She is oriented to person, place, and time. She appears well-developed and well-nourished. No distress.  Nontoxic/nonseptic appearing  HENT:  Head: Normocephalic and atraumatic.  Eyes: Conjunctivae and EOM are normal. No scleral icterus.  Neck: Normal range of motion.  Cardiovascular: Normal rate, regular rhythm and intact distal pulses.   Pulmonary/Chest: Effort normal. No respiratory distress. She has no wheezes.  Respirations even and unlabored. Lungs clear.  Abdominal: Soft. She exhibits no distension. There is tenderness. There is no rebound and no guarding.  Soft obese abdomen  with diffuse tenderness to palpation. There is evidence of mild focal tenderness in the left lower quadrant. No guarding or peritoneal signs. No masses.  Genitourinary: There is no rash, tenderness, lesion or injury on the right labia. There is no rash, tenderness, lesion or injury on the left labia. Right adnexum displays no tenderness. Left  adnexum displays no tenderness. No tenderness or bleeding in the vagina. Vaginal discharge (scant white, opaque discharge; odorless) found.  No cervix visualized; hysterectomy suspected to be total rather than partial. Uterus absent. No adnexal TTP.   Musculoskeletal: Normal range of motion.  Neurological: She is alert and oriented to person, place, and time. She exhibits normal muscle tone. Coordination normal.  Skin: Skin is warm and dry. No rash noted. She is not diaphoretic. No erythema. No pallor.  Psychiatric: She has a normal mood and affect. Her behavior is normal.  Nursing note and vitals reviewed.   ED Course  Procedures (including critical care time) Labs Review Labs Reviewed  WET PREP, GENITAL - Abnormal; Notable for the following:    Clue Cells Wet Prep HPF POC FEW (*)    WBC, Wet Prep HPF POC FEW (*)    All other components within normal limits  COMPREHENSIVE METABOLIC PANEL - Abnormal; Notable for the following:    Total Bilirubin 0.2 (*)    All other components within normal limits  URINALYSIS, ROUTINE W REFLEX MICROSCOPIC (NOT AT [facility]) - Abnormal; Notable for the following:    Specific Gravity, Urine >1.030 (*)    Ketones, ur 15 (*)    All other components within normal limits  LIPASE, BLOOD  CBC  GC/CHLAMYDIA PROBE AMP ([place]) NOT AT [facility]    Imaging Review No results found.   I have personally reviewed and evaluated these images and lab results as part of my medical decision-making.   EKG Interpretation None      MDM   Final diagnoses:  Lower abdominal pain  Vaginal discharge    59 year old female presents to the emergency department for further evaluation of vaginal discharge which began following sexual intercourse with an ex-boyfriend. She also notes some sharp, cramping lower abdominal pain which is intermittent. No fever or leukocytosis noted today. Abdominal exam is fairly benign and stable on reexamination. Patient has a history of a total  hysterectomy; ovaries and fallopian tubes remain. However, patient is not at risk for PID or TOA as she no longer has a cervix. UA sent for testing of GC/Chlamydia. Urinalysis today is not suggestive of UTI.  Wet prep suspect for BV. Given symptoms of new discharge, will treat with metrogel. Doubt emergent cause of abdominal pain such as pSBO, SBO, appendicitis, diverticulitis, or bowel perforation. Laboratory work up is noncontributory and does not suggest acute emergent process; neither does patient's stable and benign abdominal exam. Patient has had no bowel changes. No vomiting. I believe she is stable for discharge with prescription for naproxen for outpatient pain control. Patient instructed to follow-up with a primary care doctor for recheck of symptoms. Return precautions given at discharge. Patient discharged in good condition with no unaddressed concerns.   Filed Vitals:   11/04/15 2031 11/04/15 2325  BP: 135/76 128/67  Pulse: 81 78  Temp: 98.2 F (36.8 C)   TempSrc: Oral   Resp: 16 16  Height: 5\' 3"  (1.6 m)   Weight: 186 lb 1.6 oz (84.414 kg)   SpO2: 96% 98%     Antonietta Breach, PA-C 11/04/15 Climax Liu, MD  11/05/15 0055 

## 2015-11-07 LAB — GC/CHLAMYDIA PROBE AMP (~~LOC~~) NOT AT ARMC
Chlamydia: NEGATIVE
NEISSERIA GONORRHEA: NEGATIVE

## 2016-05-09 IMAGING — MR MR CERVICAL SPINE W/O CM
4 of 5 series · 22 of 48 positions shown · non-contrast
Comparison: Radiography 03/22/2015.  MRI 04/28/2009

CLINICAL DATA: Headache. Neck pain and neck stiffness. Left
shoulder pain. Duration of symptoms several weeks.

EXAM:
MRI CERVICAL SPINE WITHOUT CONTRAST
TECHNIQUE: Multiplanar, multisequence MR imaging of the cervical spine was
performed. No intravenous contrast was administered.

[Series 3: T2 · sagittal · 3.0mm · 0.41mm/px · 6 of 12 slices shown (1 of 3)]
[im 1/12]
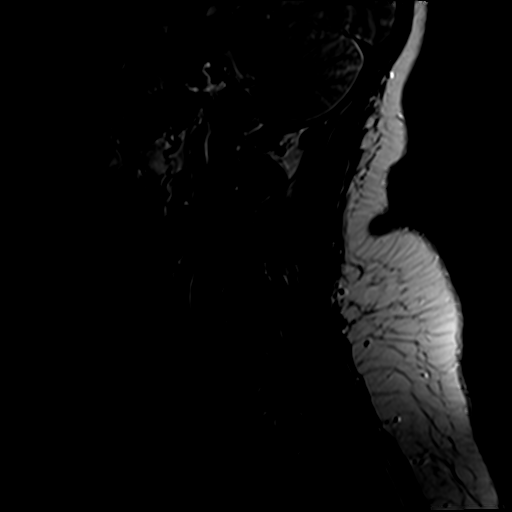
[im 3/12]
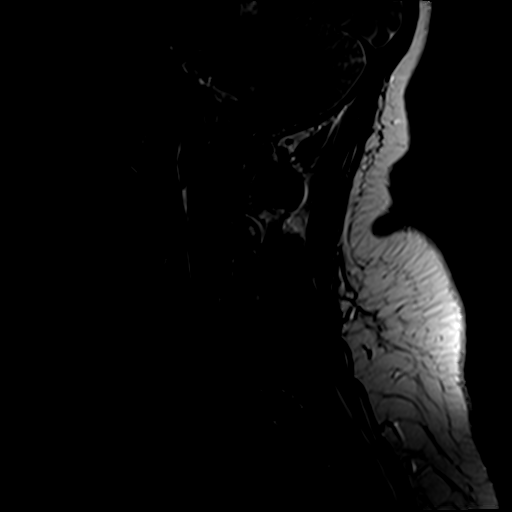
[im 5/12]
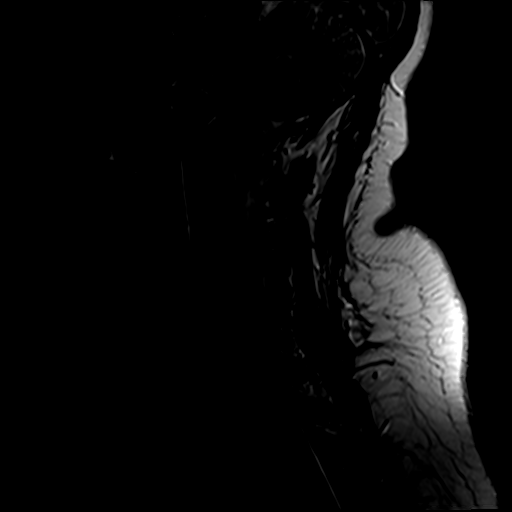
[im 7/12]
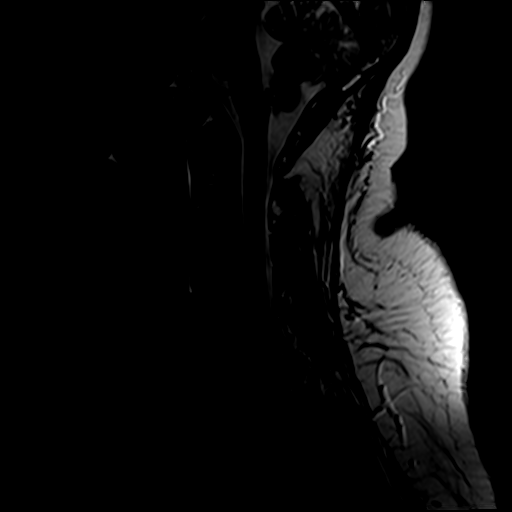
[im 9/12]
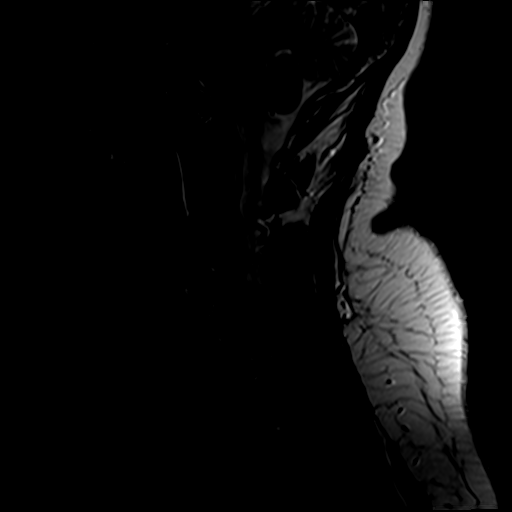
[im 12/12]
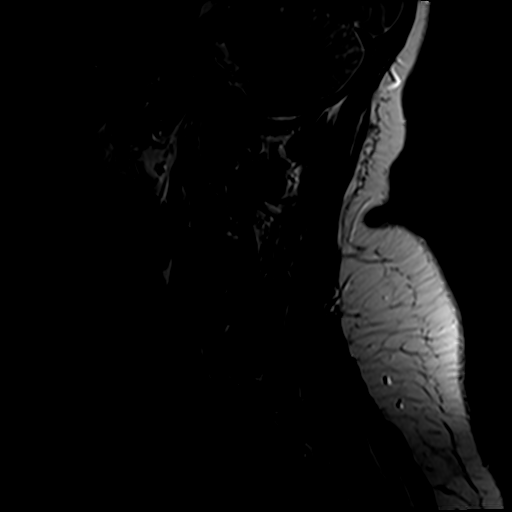

[Series 4: T1 · sagittal · 3.0mm · 0.41mm/px · 3 of 12 slices shown]
[im 2/12]
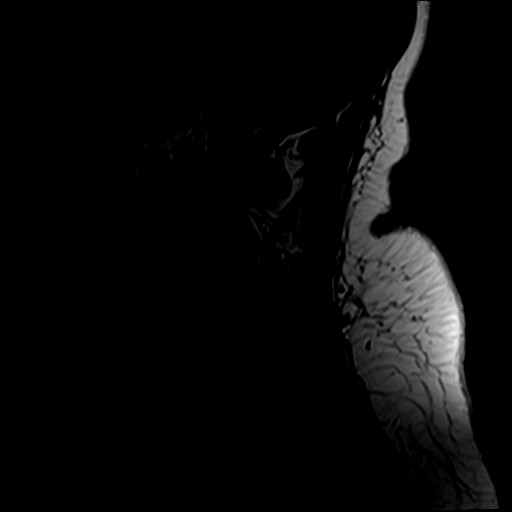
[im 6/12]
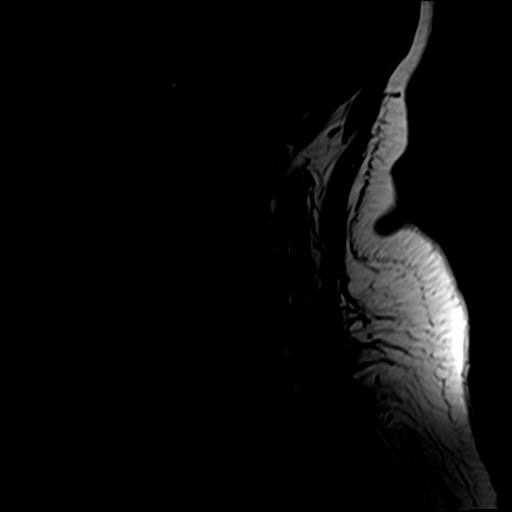
[im 10/12]
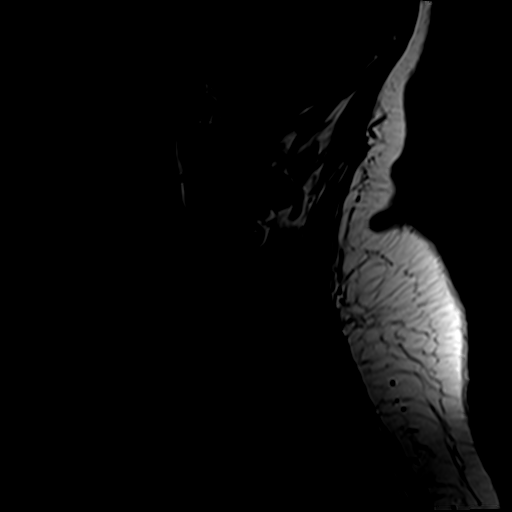

[Series 6: T2 · axial · 3.0mm · 0.39mm/px · z∈[-89,+4]mm · 8 of 25 slices shown (2 of 3)]
[im 1/25]
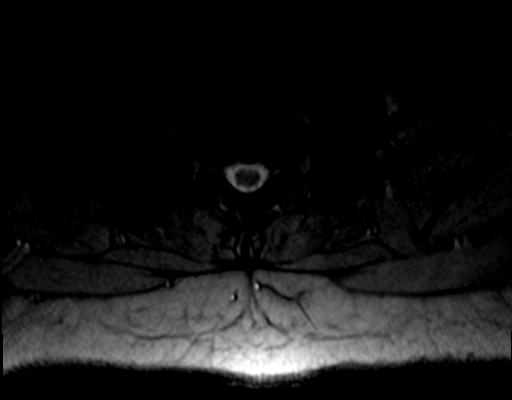
[im 4/25]
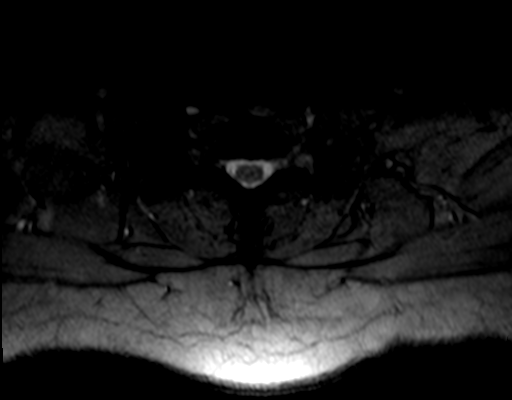
[im 8/25]
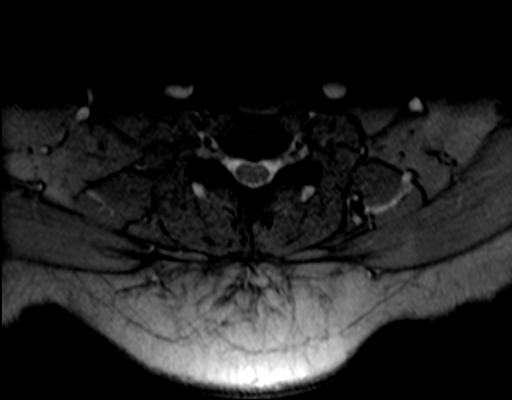
[im 12/25]
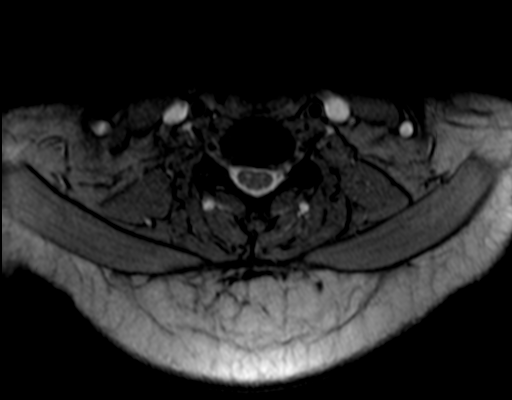
[im 13/25]
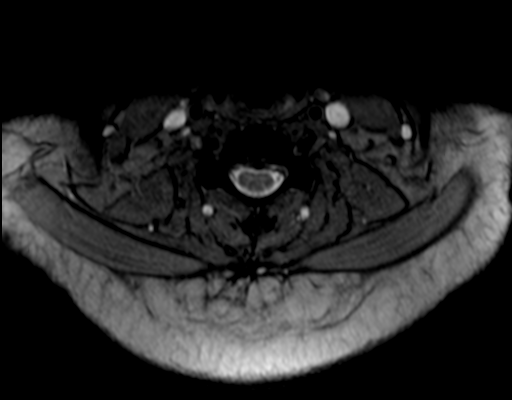
[im 17/25]
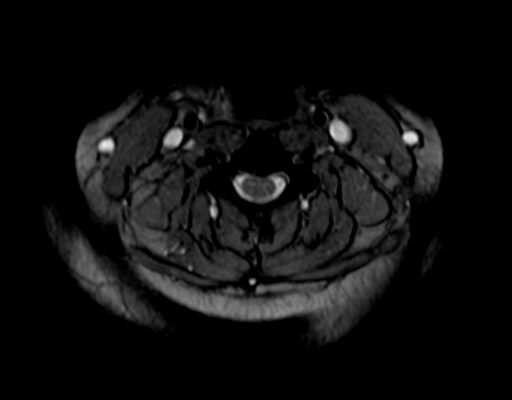
[im 21/25]
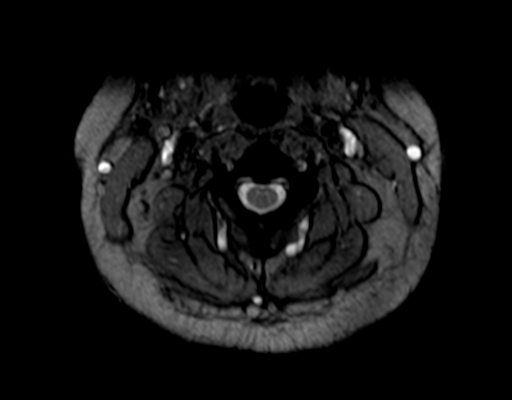
[im 25/25]
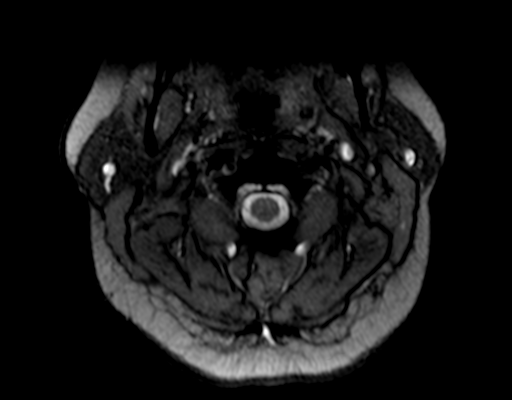

[Series 7: T2 · axial · 3.0mm · 0.39mm/px · z∈[-89,-12]mm · 5 of 25 slices shown (3 of 3)]
[im 1/25]
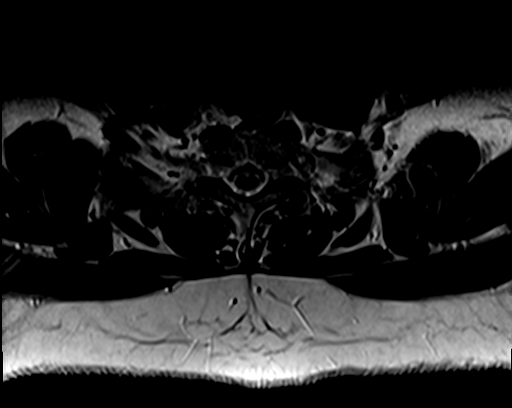
[im 4/25]
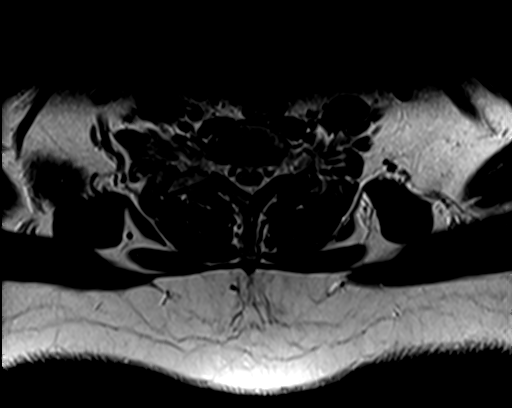
[im 8/25]
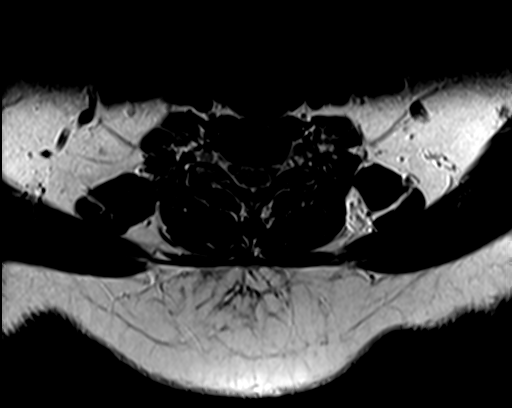
[im 13/25]
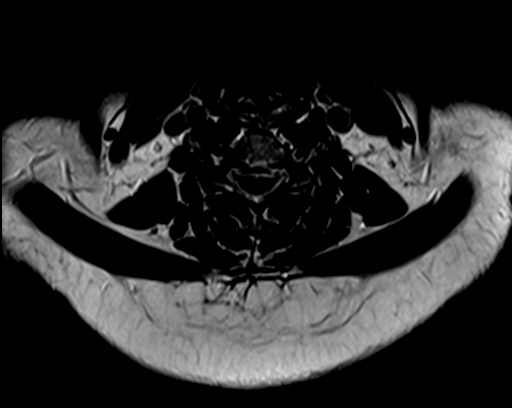
[im 21/25]
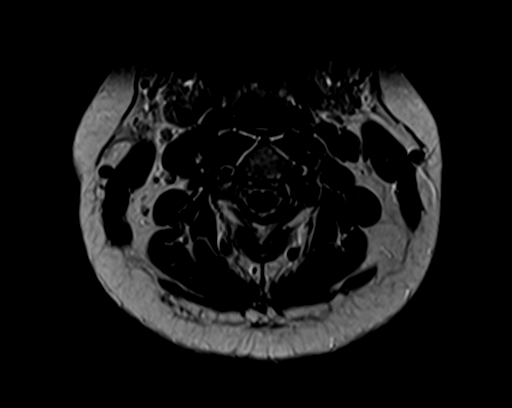

[22 of 48 positions shown; findings below may reference images not displayed]

FINDINGS: The foramen magnum is widely patent. There is no abnormality at C1-2
or C2-3.

C3-4:  Minimal bulging of the disc.  No canal or foraminal stenosis.

C4-5:  Mild bulging of the disc.  No canal or foraminal stenosis.

C5-6: Mild-to-moderate spondylosis with endplate osteophytes and
bulging of the disc. Narrowing of the ventral subarachnoid space but
no compression of the cord. Mild foraminal encroachment by
osteophytes. This is slightly more pronounced on the left.

C6-7:  Mild bulging of the disc.  No canal or foraminal stenosis.

C7-T1:  Normal interspace.

No abnormal cord signal.

Compared to the previous study, the findings are quite similar.
IMPRESSION: No acute finding. No visible changed since 6161. Mild to moderate
spondylosis at C5-6 with narrowing of the ventral subarachnoid space
but no compression of the cord. Mild foraminal encroachment, left
more than right. Neural compression is not demonstrated. The
findings could relate to neck pain or left C6 nerve root irritation,
but the findings are not advanced.

## 2016-06-14 ENCOUNTER — Emergency Department (HOSPITAL_COMMUNITY)
Admission: EM | Admit: 2016-06-14 | Discharge: 2016-06-14 | Disposition: A | Discharge status: Home / Self Care | Payer: Self-pay | Attending: Emergency Medicine | Admitting: Emergency Medicine

## 2016-06-14 ENCOUNTER — Emergency Department (HOSPITAL_COMMUNITY): Payer: Self-pay

## 2016-06-14 ENCOUNTER — Encounter (HOSPITAL_COMMUNITY): Payer: Self-pay | Admitting: Nurse Practitioner

## 2016-06-14 DIAGNOSIS — H6691 Otitis media, unspecified, right ear: Secondary | ICD-10-CM | POA: Insufficient documentation

## 2016-06-14 MED ORDER — ALBUTEROL SULFATE HFA 108 (90 BASE) MCG/ACT IN AERS
2.0000 | INHALATION_SPRAY | Freq: Once | RESPIRATORY_TRACT | Status: AC
  Administered 2016-06-14: 2 via RESPIRATORY_TRACT
  Filled 2016-06-14: qty 6.7

## 2016-06-14 MED ORDER — PENICILLIN G BENZATHINE 1200000 UNIT/2ML IM SUSP
1.2000 10*6.[IU] | Freq: Once | INTRAMUSCULAR | Status: AC
  Administered 2016-06-14: 1.2 10*6.[IU] via INTRAMUSCULAR
  Filled 2016-06-14: qty 2

## 2016-06-14 MED ORDER — AMOXICILLIN-POT CLAVULANATE 875-125 MG PO TABS: 1.0000 | ORAL_TABLET | Freq: Two times a day (BID) | ORAL | Status: DC

## 2016-06-14 MED ORDER — IPRATROPIUM-ALBUTEROL 0.5-2.5 (3) MG/3ML IN SOLN
3.0000 mL | Freq: Once | RESPIRATORY_TRACT | Status: AC
  Administered 2016-06-14: 3 mL via RESPIRATORY_TRACT
  Filled 2016-06-14: qty 3

## 2016-06-14 MED ORDER — BENZONATATE 100 MG PO CAPS: 100.0000 mg | ORAL_CAPSULE | Freq: Three times a day (TID) | ORAL | Status: DC

## 2016-06-14 NOTE — ED Notes (Signed)
Pt requesting a breathing tx and a inhaler to go home with

## 2016-06-14 NOTE — ED Provider Notes (Signed)
CSN: [id number]     Arrival date & time 06/14/16  1846 History  By signing my name below, I, Katherine Miller, attest that this documentation has been prepared under the direction and in the presence of Shawn C. Joy, PA-C. Electronically Signed: Rayna Miller, ED Scribe. 06/14/2016. 10:37 PM.   Chief Complaint  Patient presents with  . URI   The history is provided by the patient. No language interpreter was used.    HPI Comments: Katherine Miller is a 60 y.o. female with a PMHx of bronchitis who presents to the Emergency Department complaining of worsening, moderate, non-productive cough x 1 week. She states her symptoms began with a hoarse voice and a cough and worsened to include intermittent HA, intermittent fever, sore throat she describes as scratchy, mild right facial swelling, right ear congestion, decreased hearing in her right ear and wheezing. She has taken OTC cold and sinus medication without relief. She denies having an inhaler at home for her wheezing. Pt denies any other associated symptoms at this time.   Past Medical History  Diagnosis Date  . Bronchitis   . Allergy   . Anemia    Past Surgical History  Procedure Laterality Date  . Cholecystectomy    . Abdominal hysterectomy     Family History  Problem Relation Age of Onset  . Cancer Brother    Social History  Substance Use Topics  . Smoking status: Never Smoker   . Smokeless tobacco: None  . Alcohol Use: No   OB History    No data available     Review of Systems  Constitutional: Positive for fever (subjective).  HENT: Positive for congestion, ear pain (right) and sore throat. Negative for trouble swallowing.   Respiratory: Positive for cough and wheezing.   All other systems reviewed and are negative.   Allergies  Review of patient's allergies indicates no known allergies.  Home Medications   Prior to Admission medications   Medication Sig Start Date End Date Taking? Authorizing Provider   amoxicillin-clavulanate (AUGMENTIN) 875-125 MG tablet Take 1 tablet by mouth every 12 (twelve) hours. 06/14/16   Shawn C Joy, PA-C  benzonatate (TESSALON) 100 MG capsule Take 1 capsule (100 mg total) by mouth every 8 (eight) hours. 06/14/16   Shawn C Joy, PA-C  metroNIDAZOLE (METROGEL VAGINAL) 0.75 % vaginal gel Place 1 Applicatorful vaginally 2 (two) times daily. Use as instructed for 1 week. 11/04/15   Antonietta Breach, PA-C  naproxen (NAPROSYN) 500 MG tablet Take 1 tablet (500 mg total) by mouth 2 (two) times daily. 11/04/15   Antonietta Breach, PA-C   BP 151/89 mmHg  Pulse 83  Temp(Src) 99.5 F (37.5 C) (Oral)  Resp 20  SpO2 98%    Physical Exam  Constitutional: She appears well-developed and well-nourished. No distress.  HENT:  Head: Normocephalic and atraumatic.  Right Ear: Tympanic membrane is erythematous. A middle ear effusion is present.  Left Ear: Tympanic membrane, external ear and ear canal normal.  Nose: Right sinus exhibits no maxillary sinus tenderness and no frontal sinus tenderness. Left sinus exhibits no maxillary sinus tenderness and no frontal sinus tenderness.  Eyes: Conjunctivae are normal.  Neck: Neck supple.  Cardiovascular: Normal rate, regular rhythm, normal heart sounds and intact distal pulses.   Pulmonary/Chest: Effort normal and breath sounds normal. No respiratory distress. She has no wheezes. She has no rales.  Abdominal: Soft. There is no tenderness. There is no guarding.  Musculoskeletal: She exhibits no edema or tenderness.  Lymphadenopathy:    She has no cervical adenopathy.  Neurological: She is alert.  Skin: Skin is warm and dry. She is not diaphoretic.  Psychiatric: She has a normal mood and affect. Her behavior is normal.  Nursing note and vitals reviewed.   ED Course  Procedures  DIAGNOSTIC STUDIES: Oxygen Saturation is 100% on RA, normal by my interpretation.    COORDINATION OF CARE: 10:33 PM Discussed next steps with pt. Pt verbalized  understanding and is agreeable with the plan.   Imaging Review Dg Chest 2 View  06/14/2016  CLINICAL DATA:  Shortness of breath with wheezing two weeks with cough and congestion 1 week. EXAM: CHEST  2 VIEW COMPARISON:  10/19/2014 FINDINGS: Lungs are adequately inflated without focal consolidation or effusion. Cardiomediastinal silhouette is within normal. There are minimal degenerative changes of the spine. IMPRESSION: No acute cardiopulmonary disease. Electronically Signed   By: Marin Olp M.D.   On: 06/14/2016 21:40   I have personally reviewed and evaluated these images as part of my medical decision-making.   EKG Interpretation None      MDM   Final diagnoses:  Acute right otitis media, recurrence not specified, unspecified otitis media type   Lucia Bitter presents with cough, congestion, and right ear pain for the last week.  Patient was symptoms of URI, likely viral. Also evidence of right otitis media. Patient is nontoxic appearing, afebrile, not tachycardic, not tachypneic, not hypotensive, maintains SPO2 of 98-100% on room air, and is in no apparent distress. Patient has no signs of sepsis or other serious or life-threatening condition. No wheezing or increased work of breath, but pt requested a breathing treatment after being informed it was not necessary. Home care and return precautions discussed. Patient to follow-up with PCP for continued symptoms. Patient voiced understanding of these instructions and is comfortable with discharge.  Filed Vitals:   06/14/16 1905 06/14/16 2258  BP: 142/87 151/89  Pulse: 75 83  Temp: 98.5 F (36.9 C) 99.5 F (37.5 C)  TempSrc: Oral Oral  Resp: 17 20  SpO2: 100% 98%     I personally performed the services described in this documentation, which was scribed in my presence. The recorded information has been reviewed and is accurate.   Lorayne Bender, PA-C 06/15/16 [place], MD 06/17/16 [phone]

## 2016-06-14 NOTE — ED Notes (Addendum)
She c/o 1 week history of cough, congestion, fevers, wheezing, R ear pressure Symptoms are worsening since onset. She feels today her R ear is completely clogged and she can not hear out of this ear. She tried otc sinus headache med with minimal relief. She is alert and breathing easily.

## 2016-06-14 NOTE — Discharge Instructions (Signed)
You have been seen today for cough, ear pain, and sore throat. Your symptoms are consistent with a viral illness. Viruses do not require antibiotics, however, it seems as if the viral illness has caused an ear infection. Treatment is symptomatic care and an antibiotic. Please take all of your antibiotics until finished!   You may develop abdominal discomfort or diarrhea from the antibiotic.  You may help offset this with probiotics which you can buy or get in yogurt. Do not eat or take the probiotics until 2 hours after your antibiotic.  Drink plenty of fluids and get plenty of rest. You should be drinking at least a liter of water an hour to stay hydrated. Ibuprofen or Tylenol for pain or fever. Tessalon for cough. Plain Mucinex may help relieve congestion. Warm liquids or Chloraseptic spray may help soothe the sore throat. Follow up with PCP as needed. Return to ED should symptoms worsen.

## 2016-06-28 ENCOUNTER — Emergency Department (HOSPITAL_COMMUNITY): Payer: Self-pay

## 2016-06-28 ENCOUNTER — Encounter (HOSPITAL_COMMUNITY): Payer: Self-pay | Admitting: Emergency Medicine

## 2016-06-28 ENCOUNTER — Emergency Department (HOSPITAL_COMMUNITY)
Admission: EM | Admit: 2016-06-28 | Discharge: 2016-06-29 | Disposition: A | Discharge status: Home / Self Care | Payer: Self-pay | Attending: Emergency Medicine | Admitting: Emergency Medicine

## 2016-06-28 DIAGNOSIS — J40 Bronchitis, not specified as acute or chronic: Secondary | ICD-10-CM | POA: Insufficient documentation

## 2016-06-28 DIAGNOSIS — R0989 Other specified symptoms and signs involving the circulatory and respiratory systems: Secondary | ICD-10-CM

## 2016-06-28 DIAGNOSIS — R059 Cough, unspecified: Secondary | ICD-10-CM

## 2016-06-28 DIAGNOSIS — R0602 Shortness of breath: Secondary | ICD-10-CM

## 2016-06-28 DIAGNOSIS — R05 Cough: Secondary | ICD-10-CM

## 2016-06-28 LAB — BASIC METABOLIC PANEL
Anion gap: 8 (ref 5–15)
BUN: 13 mg/dL (ref 6–20)
CALCIUM: 9.1 mg/dL (ref 8.9–10.3)
CO2: 23 mmol/L (ref 22–32)
CREATININE: 0.86 mg/dL (ref 0.44–1.00)
Chloride: 109 mmol/L (ref 101–111)
GFR calc Af Amer: >60 mL/min (ref >60)
GLUCOSE: 109 mg/dL — AB (ref 65–99)
Potassium: 3.5 mmol/L (ref 3.5–5.1)
SODIUM: 140 mmol/L (ref 135–145)

## 2016-06-28 LAB — CBC WITH DIFFERENTIAL/PLATELET
BASOS ABS: 0.1 10*3/uL (ref 0.0–0.1)
Basophils Relative: 1 %
Eosinophils Absolute: 0.2 10*3/uL (ref 0.0–0.7)
Eosinophils Relative: 2 %
HEMATOCRIT: 35.4 % — AB (ref 36.0–46.0)
HEMOGLOBIN: 11.8 g/dL — AB (ref 12.0–15.0)
LYMPHS PCT: 41 %
Lymphs Abs: 3.6 10*3/uL (ref 0.7–4.0)
MCH: 28.2 pg (ref 26.0–34.0)
MCHC: 33.3 g/dL (ref 30.0–36.0)
MCV: 84.7 fL (ref 78.0–100.0)
MONO ABS: 0.4 10*3/uL (ref 0.1–1.0)
Monocytes Relative: 4 %
NEUTROS ABS: 4.6 10*3/uL (ref 1.7–7.7)
Neutrophils Relative %: 52 %
PLATELETS: 349 10*3/uL (ref 150–400)
RBC: 4.18 MIL/uL (ref 3.87–5.11)
RDW: 13.3 % (ref 11.5–15.5)
WBC: 8.9 10*3/uL (ref 4.0–10.5)

## 2016-06-28 NOTE — ED Provider Notes (Signed)
CSN: [id number]     Arrival date & time 06/28/16  2109 History   First MD Initiated Contact with Patient 06/28/16 2320     Chief Complaint  Patient presents with  . Shortness of Breath  . Cough  . Otalgia     (Consider location/radiation/quality/duration/timing/severity/associated sxs/prior Treatment) HPI   Pt is a 60 yo female with PMH of bronchitis who presents to the ED with multiple complaints. Pt reports she has had an intermittent productive cough, SOB, intermittent wheezing, chest congestion, chest tightness, intermittent rhinorrhea and nausea for the past 3-4 weeks. Pt also reports having mild right ear discomfort and "fullness". She notes she was seen in the ED for the same sxs a few weeks ago, was dx with AOM and viral URI and sent home with tessalon and augmentin. She reports taking all of her antibiotics without improvement, endorses mild intermittent relief of cough with Tessalon but notes she ran out of the medication. She also reports using the albuterol inhaler she was given with mild intermittent relief. She denies taking any other medications at home. Denies fever, chills, HA, sinus pressure, watery/itchy eyes, nasal congestion, sore throat, ear drainage, loss of hearing, hemoptysis, chest pain, abdominal pain, vomiting, diarrhea, urinary sxs, leg swelling. Denies any known sick contacts. Denies any recent surgery, hospitalizations, leg swelling, hormone use or hx of DVT/PE.   Past Medical History  Diagnosis Date  . Bronchitis   . Allergy   . Anemia    Past Surgical History  Procedure Laterality Date  . Cholecystectomy    . Abdominal hysterectomy     Family History  Problem Relation Age of Onset  . Cancer Brother    Social History  Substance Use Topics  . Smoking status: Never Smoker   . Smokeless tobacco: None  . Alcohol Use: No   OB History    No data available     Review of Systems  HENT: Positive for ear pain (right) and rhinorrhea.   Respiratory:  Positive for cough, chest tightness, shortness of breath and wheezing.   Gastrointestinal: Positive for nausea.  All other systems reviewed and are negative.     Allergies  Review of patient's allergies indicates no known allergies.  Home Medications   Prior to Admission medications   Medication Sig Start Date End Date Taking? Authorizing Provider  amoxicillin-clavulanate (AUGMENTIN) 875-125 MG tablet Take 1 tablet by mouth every 12 (twelve) hours. 06/14/16   Shawn C Joy, PA-C  benzonatate (TESSALON) 100 MG capsule Take 1 capsule (100 mg total) by mouth every 8 (eight) hours. 06/14/16   Shawn C Joy, PA-C  Chlorpheniramine-DM 4-30 MG TABS Take 1 tablet by mouth every 6 (six) hours as needed. 06/29/16   Nona Dell, PA-C  metroNIDAZOLE (METROGEL VAGINAL) 0.75 % vaginal gel Place 1 Applicatorful vaginally 2 (two) times daily. Use as instructed for 1 week. 11/04/15   Antonietta Breach, PA-C  naproxen (NAPROSYN) 500 MG tablet Take 1 tablet (500 mg total) by mouth 2 (two) times daily. 11/04/15   Antonietta Breach, PA-C  predniSONE (DELTASONE) 20 MG tablet Take 2 tablets (40 mg total) by mouth daily. 06/29/16   Nona Dell, PA-C  zolpidem (AMBIEN) 5 MG tablet Take 1 tablet (5 mg total) by mouth at bedtime as needed for sleep. 06/29/16   Chesley Noon Jaki Hammerschmidt, PA-C   BP 157/103 mmHg  Pulse 82  Temp(Src) 98.7 F (37.1 C) (Oral)  Resp 19  Ht 5\' 3"  (1.6 m)  Wt 84.369 kg  BMI 32.96 kg/m2  SpO2 100% Physical Exam  Constitutional: She is oriented to person, place, and time. She appears well-developed and well-nourished. No distress.  HENT:  Head: Normocephalic and atraumatic.  Right Ear: Hearing, tympanic membrane, external ear and ear canal normal. No drainage, swelling or tenderness. No mastoid tenderness. Tympanic membrane is not injected and not bulging. No middle ear effusion.  Left Ear: Tympanic membrane, external ear and ear canal normal. No drainage, swelling or tenderness. No  mastoid tenderness. Tympanic membrane is not injected and not bulging.  No middle ear effusion.  Nose: Nose normal. Right sinus exhibits no maxillary sinus tenderness and no frontal sinus tenderness. Left sinus exhibits no maxillary sinus tenderness and no frontal sinus tenderness.  Mouth/Throat: Uvula is midline, oropharynx is clear and moist and mucous membranes are normal. No oropharyngeal exudate, posterior oropharyngeal edema, posterior oropharyngeal erythema or tonsillar abscesses.  Eyes: Conjunctivae and EOM are normal. Pupils are equal, round, and reactive to light. Right eye exhibits no discharge. Left eye exhibits no discharge. No scleral icterus.  Neck: Normal range of motion. Neck supple.  Cardiovascular: Normal rate, regular rhythm, normal heart sounds and intact distal pulses.   Pulmonary/Chest: Effort normal and breath sounds normal. No respiratory distress. She has no wheezes. She has no rales. She exhibits no tenderness.  Abdominal: Soft. Bowel sounds are normal. She exhibits no distension and no mass. There is no tenderness. There is no rebound and no guarding.  Musculoskeletal: She exhibits no edema.  Lymphadenopathy:    She has no cervical adenopathy.  Neurological: She is alert and oriented to person, place, and time.  Skin: Skin is warm and dry. She is not diaphoretic.  Nursing note and vitals reviewed.   ED Course  Procedures (including critical care time) Labs Review Labs Reviewed  CBC WITH DIFFERENTIAL/PLATELET - Abnormal; Notable for the following:    Hemoglobin 11.8 (*)    HCT 35.4 (*)    All other components within normal limits  BASIC METABOLIC PANEL - Abnormal; Notable for the following:    Glucose, Bld 109 (*)    All other components within normal limits    Imaging Review Dg Chest 2 View  06/28/2016  CLINICAL DATA:  Dry cough, chest congestion and shortness of breath beginning last week, RIGHT ear ache, completed oral antibiotics for otitis EXAM: CHEST  2  VIEW COMPARISON:  06/14/2016 FINDINGS: Enlargement of cardiac silhouette . Mediastinal contours and pulmonary vascularity normal. Minimal chronic peribronchial thickening. No pulmonary infiltrate, pleural effusion or pneumothorax. Bones unremarkable. IMPRESSION: Enlargement of cardiac silhouette. Minimal bronchitic changes without infiltrate. Electronically Signed   By: Lavonia Dana M.D.   On: 06/28/2016 21:41   I have personally reviewed and evaluated these images and lab results as part of my medical decision-making.   EKG Interpretation None      MDM   Final diagnoses:  Bronchitis    Pt presents with SOB, cough, chest congestion, wheezing and right ear pain. Pt was seen in the ED on 06/14/16 for similar sxs, CXR negative, dx with viral URI and right AOM and d/c home with Augmentin and symptomatic tx. Hx of bronchitis. VSS. Exam unremarkable. CXR showed minimal bronchitic changes without infiltrate. Labs unremarkable. EKG showed sinus rhythm. I suspect pt's sxs are likely due to viral bronchitis. Plan to d/c pt home with steroids, decongestant and have pt continue using her albuterol inhaler at home. I do not suspect ACS or PE at this time and do not feel that any  further workup is warranted. Discussed results and plan for d/c with pt. Pt requesting refill of her Lorrin Mais, she notes she has had difficulty sleeping due to her coughing over the past few weeks. I advised pt that I will only give her a rx of a few days and then advised her that she needs to follow up with her PCP for reevaluation and refill of her rx. Discussed strict return precautions with pt.     Chesley Noon [place], [place] 06/29/16 0025  Jola Schmidt, MD 07/01/16 Laureen Abrahams

## 2016-06-28 NOTE — ED Notes (Signed)
Pt. reports persistent dry cough, chest congestion /SOB onset last week , pt. added right ear ache - completed oral antibiotic for otitis with no improvement .

## 2016-06-29 MED ORDER — PREDNISONE 20 MG PO TABS: 40.0000 mg | ORAL_TABLET | Freq: Every day | ORAL | Status: DC

## 2016-06-29 MED ORDER — ZOLPIDEM TARTRATE 5 MG PO TABS: 5.0000 mg | ORAL_TABLET | Freq: Every evening | ORAL | Status: DC | PRN

## 2016-06-29 MED ORDER — CHLORPHENIRAMINE-DM 4-30 MG PO TABS: 1.0000 | ORAL_TABLET | Freq: Four times a day (QID) | ORAL | Status: DC | PRN

## 2016-06-29 NOTE — Discharge Instructions (Signed)
Take your medications as prescribed. I also recommend using a cool mist humidifier to help with your cough and drink warm fluids at home to remain hydrated and help with your congestion. Continue using your albuterol inhaler as needed for shortness of breath. Follow up with your primary care provider in the next 5-7 days for follow up. Please return to the Emergency Department if symptoms worsen or new onset of fever, worsening shortness of breath, chest pain, coughing up blood, wheezing, abdominal pain, vomiting, headache, neck stiffness.

## 2016-06-29 NOTE — ED Notes (Signed)
Pt departed in NAD.  

## 2016-09-27 ENCOUNTER — Emergency Department (HOSPITAL_COMMUNITY): Payer: Self-pay

## 2016-09-27 ENCOUNTER — Emergency Department (HOSPITAL_COMMUNITY)
Admission: EM | Admit: 2016-09-27 | Discharge: 2016-09-27 | Disposition: A | Discharge status: Home / Self Care | Payer: Self-pay | Attending: Emergency Medicine | Admitting: Emergency Medicine

## 2016-09-27 DIAGNOSIS — M25561 Pain in right knee: Secondary | ICD-10-CM | POA: Insufficient documentation

## 2016-09-27 MED ORDER — NAPROXEN 500 MG PO TABS: 500.0000 mg | ORAL_TABLET | Freq: Two times a day (BID) | ORAL | 0 refills | Status: DC

## 2016-09-27 NOTE — ED Provider Notes (Signed)
[facility] DEPT Provider Note   CSN: [id number] Arrival date & time: 09/27/16  2010  By signing my name below, I, Johnney Killian, attest that this documentation has been prepared under the direction and in the presence of Ezequiel Essex, MD. Electronically Signed: Johnney Killian, ED Scribe. 09/27/16. 9:24 PM.   History   Chief Complaint Chief Complaint  Patient presents with  . Knee Pain    HPI Comments: Katherine Miller is a 60 y.o. female who presents to the Emergency Department complaining of sudden-onset, intermittent knee pain, redness, and instability that started a couple of days ago. Pt says she suffered a knee trauma a couple of months ago and noticed "popping and rattling" instability ever since. She says she has also experienced pain in her legs for the last couple of weeks. Pt says she enjoys jogging and walking for exercise. She denies diagnosis of DM and hypertension and says she takes no regular medications. Pt says she experienced recent vomiting that she believes was related to another issue. Pt denies fever and calf pain.   The history is provided by the patient. No language interpreter was used.    Past Medical History:  Diagnosis Date  . Allergy   . Anemia   . Bronchitis     Patient Active Problem List   Diagnosis Date Noted  . Chronic pain syndrome 07/12/2015  . Rotator cuff syndrome of left shoulder 04/26/2015  . Neck pain 01/12/2014  . 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 Rotator cuff tear 10/08/2013    Past Surgical History:  Procedure Laterality Date  . ABDOMINAL HYSTERECTOMY    . CHOLECYSTECTOMY      OB History    No data available       Home Medications    Prior to Admission medications   Medication Sig Start Date End Date Taking? Authorizing Provider  amoxicillin-clavulanate (AUGMENTIN) 875-125 MG tablet Take 1 tablet by mouth every 12 (twelve) hours. 06/14/16   Shawn C Joy, PA-C  benzonatate (TESSALON) 100 MG capsule Take 1  capsule (100 mg total) by mouth every 8 (eight) hours. 06/14/16   Shawn C Joy, PA-C  Chlorpheniramine-DM 4-30 MG TABS Take 1 tablet by mouth every 6 (six) hours as needed. 06/29/16   Nona Dell, PA-C  metroNIDAZOLE (METROGEL VAGINAL) 0.75 % vaginal gel Place 1 Applicatorful vaginally 2 (two) times daily. Use as instructed for 1 week. 11/04/15   Antonietta Breach, PA-C  naproxen (NAPROSYN) 500 MG tablet Take 1 tablet (500 mg total) by mouth 2 (two) times daily. 11/04/15   Antonietta Breach, PA-C  predniSONE (DELTASONE) 20 MG tablet Take 2 tablets (40 mg total) by mouth daily. 06/29/16   Nona Dell, PA-C  zolpidem (AMBIEN) 5 MG tablet Take 1 tablet (5 mg total) by mouth at bedtime as needed for sleep. 06/29/16   Nona Dell, PA-C    Family History Family History  Problem Relation Age of Onset  . Cancer Brother     Social History Social History  Substance Use Topics  . Smoking status: Never Smoker  . Smokeless tobacco: Not on file  . Alcohol use No     Allergies   Review of patient's allergies indicates no known allergies.   Review of Systems Review of Systems  Constitutional: Negative for fever.  Gastrointestinal: Positive for vomiting.  Musculoskeletal: Positive for arthralgias and myalgias.     Physical Exam Updated Vital Signs Pulse 66   Temp 98.1 F (36.7 C) (Oral)   Resp 18  Ht 5\' 3"  (1.6 m)   Wt 183 lb (83 kg)   SpO2 99%   BMI 32.42 kg/m   Physical Exam  Constitutional: She is oriented to person, place, and time. She appears well-developed and well-nourished. No distress.  HENT:  Head: Normocephalic and atraumatic.  Mouth/Throat: Oropharynx is clear and moist. No oropharyngeal exudate.  Eyes: Conjunctivae and EOM are normal. Pupils are equal, round, and reactive to light.  Neck: Normal range of motion. Neck supple.  No meningismus.  Cardiovascular: Normal rate, regular rhythm, normal heart sounds and intact distal pulses.   No murmur  heard. Pulmonary/Chest: Effort normal and breath sounds normal. No respiratory distress.  Abdominal: Soft. There is no tenderness. There is no rebound and no guarding.  Musculoskeletal:  Right knee medial joint line tenderness No warmth or effusion No ligament laxity Flexion and extension intact Quadriceps tendon appears intact  Neurological: She is alert and oriented to person, place, and time. No cranial nerve deficit. She exhibits normal muscle tone. Coordination normal.   5/5 strength throughout. CN 2-12 intact.Equal grip strength.   Skin: Skin is warm.  Psychiatric: She has a normal mood and affect. Her behavior is normal.  Nursing note and vitals reviewed.    ED Treatments / Results   DIAGNOSTIC STUDIES: Oxygen Saturation is 99% on RA, normal by my interpretation.    COORDINATION OF CARE: 9:07 PM Discussed treatment plan with pt at bedside and pt agreed to plan.   Labs (all labs ordered are listed, but only abnormal results are displayed) Labs Reviewed - No data to display  EKG  EKG Interpretation None       Radiology No results found.  Procedures Procedures (including critical care time)  Medications Ordered in ED Medications - No data to display   Initial Impression / Assessment and Plan / ED Course  I have reviewed the triage vital signs and the nursing notes.  Pertinent labs & imaging results that were available during my care of the patient were reviewed by me and considered in my medical decision making (see chart for details).  Clinical Course  Patient with right knee pain for the past several months became worse without injury. Denies fever. denies vomiting. denies weakness, numbness or tingling.  No tendon or ligament laxity on exam. No effusion or warmth.  X-ray negative. Patient given a knee sleeve and crutches. We'll give NSAIDs, follow-up with orthopedics. Return precautions discussed.    Final Clinical Impressions(s) / ED Diagnoses    Final diagnoses:  Acute pain of right knee    New Prescriptions New Prescriptions   No medications on file   I personally performed the services described in this documentation, which was scribed in my presence. The recorded information has been reviewed and is accurate.     Ezequiel Essex, MD 09/27/16 [phone]

## 2016-09-27 NOTE — ED Notes (Signed)
See md assessment 

## 2016-09-27 NOTE — ED Triage Notes (Signed)
Pt states that for the past few months she is been having R knee pain. Last night the pain got worse. Denies injury, states it keeps popping in and out of place.

## 2016-11-18 ENCOUNTER — Encounter (HOSPITAL_COMMUNITY): Payer: Self-pay

## 2016-11-18 ENCOUNTER — Emergency Department (HOSPITAL_COMMUNITY)
Admission: EM | Admit: 2016-11-18 | Discharge: 2016-11-18 | Disposition: A | Discharge status: Home / Self Care | Payer: Self-pay | Attending: Emergency Medicine | Admitting: Emergency Medicine

## 2016-11-18 ENCOUNTER — Emergency Department (HOSPITAL_COMMUNITY): Payer: Self-pay

## 2016-11-18 DIAGNOSIS — R05 Cough: Secondary | ICD-10-CM | POA: Insufficient documentation

## 2016-11-18 DIAGNOSIS — R059 Cough, unspecified: Secondary | ICD-10-CM

## 2016-11-18 DIAGNOSIS — R0602 Shortness of breath: Secondary | ICD-10-CM | POA: Insufficient documentation

## 2016-11-18 LAB — CBC WITH DIFFERENTIAL/PLATELET
Basophils Absolute: 0 10*3/uL (ref 0.0–0.1)
Basophils Relative: 0 %
EOS ABS: 0.2 10*3/uL (ref 0.0–0.7)
EOS PCT: 2 %
HCT: 37.5 % (ref 36.0–46.0)
Hemoglobin: 12.7 g/dL (ref 12.0–15.0)
LYMPHS ABS: 3.6 10*3/uL (ref 0.7–4.0)
Lymphocytes Relative: 48 %
MCH: 28.6 pg (ref 26.0–34.0)
MCHC: 33.9 g/dL (ref 30.0–36.0)
MCV: 84.5 fL (ref 78.0–100.0)
MONOS PCT: 5 %
Monocytes Absolute: 0.4 10*3/uL (ref 0.1–1.0)
Neutro Abs: 3.4 10*3/uL (ref 1.7–7.7)
Neutrophils Relative %: 45 %
PLATELETS: 320 10*3/uL (ref 150–400)
RBC: 4.44 MIL/uL (ref 3.87–5.11)
RDW: 13.8 % (ref 11.5–15.5)
WBC: 7.6 10*3/uL (ref 4.0–10.5)

## 2016-11-18 LAB — BASIC METABOLIC PANEL
Anion gap: 12 (ref 5–15)
BUN: 15 mg/dL (ref 6–20)
CALCIUM: 9.7 mg/dL (ref 8.9–10.3)
CHLORIDE: 106 mmol/L (ref 101–111)
CO2: 22 mmol/L (ref 22–32)
CREATININE: 0.75 mg/dL (ref 0.44–1.00)
GFR calc Af Amer: >60 mL/min (ref >60)
GFR calc non Af Amer: >60 mL/min (ref >60)
Glucose, Bld: 89 mg/dL (ref 65–99)
Potassium: 3.9 mmol/L (ref 3.5–5.1)
SODIUM: 140 mmol/L (ref 135–145)

## 2016-11-18 LAB — I-STAT TROPONIN, ED: Troponin i, poc: 0 ng/mL (ref 0.00–0.08)

## 2016-11-18 LAB — I-STAT CG4 LACTIC ACID, ED: LACTIC ACID, VENOUS: 0.75 mmol/L (ref 0.5–1.9)

## 2016-11-18 LAB — BRAIN NATRIURETIC PEPTIDE: B Natriuretic Peptide: 12 pg/mL (ref 0.0–100.0)

## 2016-11-18 MED ORDER — ALBUTEROL SULFATE HFA 108 (90 BASE) MCG/ACT IN AERS
2.0000 | INHALATION_SPRAY | RESPIRATORY_TRACT | Status: DC | PRN
Start: 2016-11-18 — End: 2016-11-19
  Administered 2016-11-18: 2 via RESPIRATORY_TRACT
  Filled 2016-11-18: qty 6.7

## 2016-11-18 MED ORDER — IPRATROPIUM-ALBUTEROL 0.5-2.5 (3) MG/3ML IN SOLN
3.0000 mL | Freq: Once | RESPIRATORY_TRACT | Status: AC
  Administered 2016-11-18: 3 mL via RESPIRATORY_TRACT
  Filled 2016-11-18: qty 3

## 2016-11-18 MED ORDER — IPRATROPIUM-ALBUTEROL 0.5-2.5 (3) MG/3ML IN SOLN: 3.0000 mL | Freq: Once | RESPIRATORY_TRACT | Status: DC

## 2016-11-18 MED ORDER — AEROCHAMBER PLUS W/MASK MISC
1.0000 | Freq: Once | Status: AC
  Administered 2016-11-18: 1
  Filled 2016-11-18: qty 1

## 2016-11-18 MED ORDER — ALBUTEROL SULFATE (2.5 MG/3ML) 0.083% IN NEBU: 2.5000 mg | INHALATION_SOLUTION | Freq: Four times a day (QID) | RESPIRATORY_TRACT | 12 refills | Status: DC | PRN

## 2016-11-18 MED ORDER — HYDROXYZINE HCL 25 MG PO TABS
25.0000 mg | ORAL_TABLET | Freq: Once | ORAL | Status: AC
  Administered 2016-11-18: 25 mg via ORAL
  Filled 2016-11-18: qty 1

## 2016-11-18 NOTE — ED Provider Notes (Signed)
[facility] DEPT Provider Note   CSN: [id number] Arrival date & time: 11/18/16  1844     History   Chief Complaint Chief Complaint  Patient presents with  . Shortness of Breath    HPI Katherine Miller is a 60 y.o. female.  60 year old female with history of bronchitis who presents with cough and shortness of breath. The patient states that several weeks ago, she came sick with a productive cough, mucus, hoarseness, and some vomiting. She began feeling better and she no longer has any mucus but she continues to have a dry cough. She has had progressively worsening shortness of breath since then which is intermittent, sometimes worse with exertion. No associated chest pain. She reports subjective fevers and chills recently, has not measured her temperature. She denies any vomiting or abdominal pain. No leg swelling, history of blood clots, OCP use, or history of cancer. No family history of early heart disease. She is concerned that she has had bronchitis several times over the past few months. Patient also notes that she has had intermittent anxiety recently that is increased from her baseline because her father died in 09-25-23. She requests a refill of Atarax for anxiety.   The history is provided by the patient.  Shortness of Breath     Past Medical History:  Diagnosis Date  . Allergy   . Anemia   . Bronchitis     Patient Active Problem List   Diagnosis Date Noted  . Chronic pain syndrome 07/12/2015  . Rotator cuff syndrome of left shoulder 04/26/2015  . Neck pain 01/12/2014  . 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 Rotator cuff tear 10/08/2013    Past Surgical History:  Procedure Laterality Date  . ABDOMINAL HYSTERECTOMY    . CHOLECYSTECTOMY      OB History    No data available       Home Medications    Prior to Admission medications   Medication Sig Start Date End Date Taking? Authorizing Provider  zolpidem (AMBIEN) 5 MG tablet Take 1 tablet (5 mg  total) by mouth at bedtime as needed for sleep. 06/29/16  Yes Nona Dell, PA-C  albuterol (PROVENTIL) (2.5 MG/3ML) 0.083% nebulizer solution Take 3 mLs (2.5 mg total) by nebulization every 6 (six) hours as needed for wheezing or shortness of breath. 11/18/16   Sharlett Iles, MD    Family History Family History  Problem Relation Age of Onset  . Cancer Brother     Social History Social History  Substance Use Topics  . Smoking status: Never Smoker  . Smokeless tobacco: Never Used  . Alcohol use No     Allergies   Patient has no known allergies.   Review of Systems Review of Systems  Respiratory: Positive for shortness of breath.    10 Systems reviewed and are negative for acute change except as noted in the HPI.   Physical Exam Updated Vital Signs BP 126/77 (BP Location: Left Arm)   Pulse 65   Temp 98.4 F (36.9 C) (Oral)   Resp 18   Ht 5\' 3"  (1.6 m)   Wt 183 lb (83 kg)   SpO2 99%   BMI 32.42 kg/m   Physical Exam  Constitutional: She is oriented to person, place, and time. She appears well-developed and well-nourished. No distress.  HENT:  Head: Normocephalic and atraumatic.  Moist mucous membranes  Eyes: Conjunctivae are normal. Pupils are equal, round, and reactive to light.  Neck: Neck supple.  Cardiovascular: Normal rate,  regular rhythm and normal heart sounds.   No murmur heard. Pulmonary/Chest: Effort normal and breath sounds normal.  Abdominal: Soft. Bowel sounds are normal. She exhibits no distension. There is no tenderness.  Musculoskeletal: She exhibits no edema.  Neurological: She is alert and oriented to person, place, and time.  Fluent speech  Skin: Skin is warm and dry.  Psychiatric: She has a normal mood and affect. Judgment normal.  Nursing note and vitals reviewed.    ED Treatments / Results  Labs (all labs ordered are listed, but only abnormal results are displayed) Labs Reviewed  BASIC METABOLIC PANEL  CBC WITH  DIFFERENTIAL/PLATELET  BRAIN NATRIURETIC PEPTIDE  I-STAT CG4 LACTIC ACID, ED  I-STAT [place], ED    EKG  EKG Interpretation  Date/Time:  Sunday November 18 2016 21:21:30 EST Ventricular Rate:  78 PR Interval:    QRS Duration: 84 QT Interval:  418 QTC Calculation: 477 R Axis:   40 Text Interpretation:  Sinus rhythm Low voltage, precordial leads Abnormal R-wave progression, early transition Borderline T abnormalities, anterior leads No significant change since last tracing Confirmed by LITTLE MD, RACHEL [phone]) on 11/18/2016 9:52:17 PM       Radiology Dg Chest 2 View  Result Date: 11/18/2016 CLINICAL DATA:  Cough for 3-1/2 weeks with shortness breath EXAM: CHEST  2 VIEW COMPARISON:  06/28/2016 FINDINGS: No focal pulmonary infiltrate, consolidation or effusion. Borderline enlargement of heart size. Normal vascularity. No pneumothorax. Surgical clips in the right upper quadrant. IMPRESSION: No active cardiopulmonary disease. Electronically Signed   By: Donavan Foil M.D.   On: 11/18/2016 19:50    Procedures Procedures (including critical care time)  Medications Ordered in ED Medications  albuterol (PROVENTIL HFA;VENTOLIN HFA) 108 (90 Base) MCG/ACT inhaler 2 puff (2 puffs Inhalation Given 11/18/16 2124)  aerochamber plus with mask device 1 each (1 each Other Given 11/18/16 2125)  ipratropium-albuterol (DUONEB) 0.5-2.5 (3) MG/3ML nebulizer solution 3 mL (3 mLs Nebulization Given 11/18/16 2134)  hydrOXYzine (ATARAX/VISTARIL) tablet 25 mg (25 mg Oral Given 11/18/16 2133)     Initial Impression / Assessment and Plan / ED Course  I have reviewed the triage vital signs and the nursing notes.  Pertinent labs & imaging results that were available during my care of the patient were reviewed by me and considered in my medical decision making (see chart for details).  Clinical Course     Patient with 3 weeks of cough, initially productive of mucus and associated with other  infectious symptoms, now dry and persistent and associated w/ intermittent shortness of breath. She was well-appearing on exam, breathing comfortably, with no wheezes or abnormal lung sounds. Chest x-ray negative for acute process. Vital signs unremarkable. Because of the shortness of breath, obtained above lab work including troponin and BNP.  All labwork was unremarkable and patient remains well-appearing with normal vital signs on reexamination.  The patient has no risk factors for PE and given that her symptoms have surrounded a cough that was initially part of a URI syndrome, I feel that PE is very unlikely. She has no exertional chest pain and with reassuring EKG and labs I feel that ACS is very unlikely. I have discussed supportive care and instructed to follow-up with a PCP if her symptoms do not improve. The patient requested anxiety medication refill but I declined stating that she would have to get this from her primary care provider. She stated that albuterol improves her symptoms. Although she does not have any wheezing here, I provided  with albuterol if it helps her as there may be a degree of bronchospasm. Do not feel she needs steroids at this time. Patient voiced understanding of plan and was discharged in satisfactory condition. Final Clinical Impressions(s) / ED Diagnoses   Final diagnoses:  Cough  SOB (shortness of breath)    New Prescriptions Discharge Medication List as of 11/18/2016 11:27 PM    START taking these medications   Details  albuterol (PROVENTIL) (2.5 MG/3ML) 0.083% nebulizer solution Take 3 mLs (2.5 mg total) by nebulization every 6 (six) hours as needed for wheezing or shortness of breath., Starting Sun 11/18/2016, Print         Sharlett Iles, MD 11/19/16 [phone]

## 2016-11-18 NOTE — ED Notes (Signed)
MD at bedside. 

## 2016-11-18 NOTE — ED Notes (Signed)
Patient verbalized understanding of discharge instructions and denies any further needs or questions at this time. VS stable. Patient ambulatory with steady gait.  

## 2016-11-18 NOTE — Discharge Instructions (Signed)
YOU MAY TAKE MUCINEX DM AS DIRECTED ON THE BOX FOR YOUR COUGH.

## 2016-11-18 NOTE — ED Triage Notes (Addendum)
Patient here with ongoing cough and shortness of breath x 3 weeks. Denies smoking. Reports cough is dry. No chest pain, NAD. Speaking complete sentences. denis fever. Also request something for increased anxiety due to death of father in 09/30/2023

## 2016-12-07 ENCOUNTER — Ambulatory Visit (INDEPENDENT_AMBULATORY_CARE_PROVIDER_SITE_OTHER): Payer: Self-pay | Admitting: Internal Medicine

## 2016-12-07 ENCOUNTER — Encounter: Payer: Self-pay | Admitting: Internal Medicine

## 2016-12-07 VITALS — BP 130/82 | HR 78 | Resp 12 | Ht 62.0 in | Wt 183.0 lb

## 2016-12-07 DIAGNOSIS — R739 Hyperglycemia, unspecified: Secondary | ICD-10-CM | POA: Insufficient documentation

## 2016-12-07 DIAGNOSIS — G4700 Insomnia, unspecified: Secondary | ICD-10-CM

## 2016-12-07 DIAGNOSIS — F419 Anxiety disorder, unspecified: Secondary | ICD-10-CM

## 2016-12-07 LAB — GLUCOSE, POCT (MANUAL RESULT ENTRY): POC GLUCOSE: 119 mg/dL — AB (ref 70–99)

## 2016-12-07 MED ORDER — HYDROXYZINE PAMOATE 25 MG PO CAPS: ORAL_CAPSULE | ORAL | 1 refills | Status: DC
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
## 2017-01-23 ENCOUNTER — Ambulatory Visit: Payer: Self-pay | Admitting: Internal Medicine

## 2017-02-28 ENCOUNTER — Encounter: Payer: Self-pay | Admitting: Internal Medicine

## 2017-02-28 ENCOUNTER — Ambulatory Visit (INDEPENDENT_AMBULATORY_CARE_PROVIDER_SITE_OTHER): Payer: Self-pay | Admitting: Internal Medicine

## 2017-02-28 VITALS — BP 118/82 | HR 78 | Resp 12 | Ht 63.0 in | Wt 188.0 lb

## 2017-02-28 DIAGNOSIS — M25561 Pain in right knee: Secondary | ICD-10-CM

## 2017-02-28 DIAGNOSIS — G8929 Other chronic pain: Secondary | ICD-10-CM

## 2017-02-28 NOTE — Progress Notes (Signed)
   Subjective:    Patient ID: Katherine Miller, female    DOB: 12-31-1955, 61 y.o.   MRN: [id number]  HPI  Right Knee pain with walking up stairs for about 6 months to 1 year.  If walking on flat surface, no significant pain.  No redness or swelling of knee.  No injury history to knee.  Does get a locking sensation with going up the stairs. When walks down stairs, some discomfort as well, but not as severe. History is confusing, but sounds like she started walking up stairs sideways about 4 months ago and the pain went away. She thinks it restarted about 1 month ago and now is the most severe it has eve Yesterday, when going up 4 stairs at her sister's home, she had to walk up sideways again.Marland Kitchen   Has taken Motrin at times, but what really helps is just bedrest for a couple of days.   Current Meds  Medication Sig  . hydrOXYzine (VISTARIL) 25 MG capsule 1 capsule by mouth twice daily as needed for anxiety    No Known Allergies    Review of Systems     Objective:   Physical Exam  Right Knee:  No effusion or erythema.  Full ROM, NT over both medial and lateral joint margins.  NT with compression of patella against anterior joint.  No increased pain or laxity with stress maneuvers of cruciates or collateral ligaments.  Gait normal currently.      Assessment & Plan:  Chronic recurrent right knee pain:  ? Meniscal flap tear.  Referral to ortho, [facility]. Will likely need MRI.   Financial assistance information for [facility] given.

## 2017-02-28 NOTE — Patient Instructions (Signed)
Call if you do not hear from [facility] ortho in next 2-3 weeks.

## 2017-04-09 ENCOUNTER — Ambulatory Visit (INDEPENDENT_AMBULATORY_CARE_PROVIDER_SITE_OTHER): Payer: Self-pay | Admitting: Internal Medicine

## 2017-04-09 ENCOUNTER — Other Ambulatory Visit: Payer: Self-pay | Admitting: Internal Medicine

## 2017-04-09 VITALS — BP 140/92 | HR 72 | Resp 12 | Ht 63.0 in | Wt 189.0 lb

## 2017-04-09 DIAGNOSIS — Z0000 Encounter for general adult medical examination without abnormal findings: Secondary | ICD-10-CM

## 2017-04-09 DIAGNOSIS — E010 Iodine-deficiency related diffuse (endemic) goiter: Secondary | ICD-10-CM

## 2017-04-09 DIAGNOSIS — N6320 Unspecified lump in the left breast, unspecified quadrant: Secondary | ICD-10-CM

## 2017-04-09 LAB — POCT URINALYSIS DIPSTICK
Bilirubin, UA: NEGATIVE
Blood, UA: NEGATIVE
GLUCOSE UA: NEGATIVE
Ketones, UA: NEGATIVE
LEUKOCYTES UA: NEGATIVE
Nitrite, UA: NEGATIVE
PROTEIN UA: 15
SPEC GRAV UA: 1.015 (ref 1.010–1.025)
Urobilinogen, UA: 0.2 E.U./dL
pH, UA: 6 (ref 5.0–8.0)

## 2017-04-10 ENCOUNTER — Other Ambulatory Visit: Payer: Self-pay | Admitting: Internal Medicine

## 2017-04-10 DIAGNOSIS — N63 Unspecified lump in unspecified breast: Secondary | ICD-10-CM

## 2017-04-10 LAB — CBC WITH DIFFERENTIAL/PLATELET
BASOS: 0 %
Basophils Absolute: 0 10*3/uL (ref 0.0–0.2)
EOS (ABSOLUTE): 0.2 10*3/uL (ref 0.0–0.4)
EOS: 3 %
HEMATOCRIT: 35.6 % (ref 34.0–46.6)
HEMOGLOBIN: 12.1 g/dL (ref 11.1–15.9)
IMMATURE GRANS (ABS): 0 10*3/uL (ref 0.0–0.1)
IMMATURE GRANULOCYTES: 0 %
LYMPHS: 50 %
Lymphocytes Absolute: 2.9 10*3/uL (ref 0.7–3.1)
MCH: 28.6 pg (ref 26.6–33.0)
MCHC: 34 g/dL (ref 31.5–35.7)
MCV: 84 fL (ref 79–97)
MONOCYTES: 6 %
Monocytes Absolute: 0.3 10*3/uL (ref 0.1–0.9)
Neutrophils Absolute: 2.4 10*3/uL (ref 1.4–7.0)
Neutrophils: 41 %
PLATELETS: 295 10*3/uL (ref 150–379)
RBC: 4.23 x10E6/uL (ref 3.77–5.28)
RDW: 15 % (ref 12.3–15.4)
WBC: 5.8 10*3/uL (ref 3.4–10.8)

## 2017-04-10 LAB — COMPREHENSIVE METABOLIC PANEL
A/G RATIO: 1.7 (ref 1.2–2.2)
ALT: 63 IU/L — AB (ref 0–32)
AST: 50 IU/L — ABNORMAL HIGH (ref 0–40)
Albumin: 4.4 g/dL (ref 3.5–5.5)
Alkaline Phosphatase: 121 IU/L — ABNORMAL HIGH (ref 39–117)
BUN/Creatinine Ratio: 14 (ref 9–23)
BUN: 10 mg/dL (ref 6–24)
Bilirubin Total: 0.2 mg/dL (ref 0.0–1.2)
CALCIUM: 9 mg/dL (ref 8.7–10.2)
CO2: 25 mmol/L (ref 18–29)
Chloride: 103 mmol/L (ref 96–106)
Creatinine, Ser: 0.71 mg/dL (ref 0.57–1.00)
GFR, EST AFRICAN AMERICAN: 109 mL/min/{1.73_m2} (ref >59)
GFR, EST NON AFRICAN AMERICAN: 94 mL/min/{1.73_m2} (ref >59)
GLOBULIN, TOTAL: 2.6 g/dL (ref 1.5–4.5)
Glucose: 85 mg/dL (ref 65–99)
POTASSIUM: 4.2 mmol/L (ref 3.5–5.2)
Sodium: 142 mmol/L (ref 134–144)
TOTAL PROTEIN: 7 g/dL (ref 6.0–8.5)

## 2017-04-10 LAB — LIPID PANEL W/O CHOL/HDL RATIO
Cholesterol, Total: 186 mg/dL (ref 100–199)
HDL: 57 mg/dL (ref >39)
LDL Calculated: 101 mg/dL — ABNORMAL HIGH (ref 0–99)
Triglycerides: 142 mg/dL (ref 0–149)
VLDL CHOLESTEROL CAL: 28 mg/dL (ref 5–40)

## 2017-04-10 LAB — TSH: TSH: 1.33 u[IU]/mL (ref 0.450–4.500)

## 2017-04-17 ENCOUNTER — Other Ambulatory Visit: Payer: Self-pay | Admitting: Obstetrics and Gynecology

## 2017-04-17 DIAGNOSIS — Z1231 Encounter for screening mammogram for malignant neoplasm of breast: Secondary | ICD-10-CM

## 2017-04-17 DIAGNOSIS — R928 Other abnormal and inconclusive findings on diagnostic imaging of breast: Secondary | ICD-10-CM

## 2017-04-30 ENCOUNTER — Ambulatory Visit (HOSPITAL_COMMUNITY)
Admission: RE | Admit: 2017-04-30 | Discharge: 2017-04-30 | Disposition: A | Discharge status: Home / Self Care | Payer: Self-pay | Source: Ambulatory Visit | Attending: Obstetrics and Gynecology | Admitting: Obstetrics and Gynecology

## 2017-04-30 ENCOUNTER — Encounter (INDEPENDENT_AMBULATORY_CARE_PROVIDER_SITE_OTHER): Payer: Self-pay

## 2017-04-30 ENCOUNTER — Ambulatory Visit
Admission: RE | Admit: 2017-04-30 | Discharge: 2017-04-30 | Disposition: A | Discharge status: Home / Self Care | Payer: No Typology Code available for payment source | Source: Ambulatory Visit | Attending: Obstetrics and Gynecology | Admitting: Obstetrics and Gynecology

## 2017-04-30 VITALS — BP 142/91 | Temp 98.3°F | Wt 190.0 lb

## 2017-04-30 DIAGNOSIS — Z1231 Encounter for screening mammogram for malignant neoplasm of breast: Secondary | ICD-10-CM

## 2017-04-30 DIAGNOSIS — Z1239 Encounter for other screening for malignant neoplasm of breast: Secondary | ICD-10-CM

## 2017-04-30 NOTE — Patient Instructions (Signed)
Explained breast self awareness with Lucia Bitter. Patient did not need a Pap smear today due to her history of a hysterectomy for benign reasons. Let patient know that she doesn't need any further Pap smears due to her history of a hysterectomy for benign reasons. Referred patient to the [facility] for a screening mammogram. Appointment scheduled for Tuesday, Apr 30, 2017 at 1110. Let patient know the [facility] will follow up with her within the next couple weeks with results of mammogram by letter or phone. Lucia Bitter verbalized understanding.  Melchor Kirchgessner, Arvil Chaco, RN 1:15 PM

## 2017-04-30 NOTE — Progress Notes (Signed)
No complaints today.   Pap Smear: Pap smear not completed today. Last Pap smear was 03/05/2011 at [facility] and normal. Per patient has no history of an abnormal Pap smear. Patient has a history of a hysterectomy in 1984 due to benign reasons. Per BCCCP and ACOG patient no longer needs Pap smears due to her history of a hysterectomy for benign reasons. Last Pap smear result is in EPIC.   Physical exam: Breasts Breasts symmetrical. No skin abnormalities bilateral breasts. No nipple retraction bilateral breasts. No nipple discharge bilateral breasts. No lymphadenopathy. No lumps palpated bilateral breasts. No complaints of pain or tenderness on exam. Referred patient to the [facility] for a screening mammogram. Appointment scheduled for Tuesday, Apr 30, 2017 at 1110.        Pelvic/Bimanual No Pap smear completed today since patient has a history of a hysterectomy for benign reasons. Pap smear not indicated per BCCCP guidelines.   Smoking History: Patient has never smoked.  Patient Navigation: Patient education provided. Access to services provided for patient through [facility] program.   Colorectal Cancer Screening: Per patient had a colonoscopy completed 5 years ago. No complaints today. FIT Test given to patient to complete and return to B[facility]CP.

## 2017-05-01 ENCOUNTER — Encounter (HOSPITAL_COMMUNITY): Payer: Self-pay | Admitting: *Deleted

## 2017-05-01 NOTE — Addendum Note (Signed)
Encounter addended by: Armond Hang, LPN on: 03/02/5319 23:34 AM<BR>    Actions taken: Visit Navigator Flowsheet section accepted, Order Reconciliation Section accessed

## 2017-05-14 ENCOUNTER — Telehealth: Payer: Self-pay | Admitting: Internal Medicine

## 2017-05-14 NOTE — Telephone Encounter (Signed)
Patient has not been feeling well for last three days.  Has had fever up and down and would like to be seen.

## 2017-05-20 ENCOUNTER — Encounter: Payer: Self-pay | Admitting: Internal Medicine
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
## 2017-05-27 ENCOUNTER — Emergency Department (HOSPITAL_COMMUNITY): Payer: No Typology Code available for payment source

## 2017-05-27 ENCOUNTER — Emergency Department (HOSPITAL_COMMUNITY)
Admission: EM | Admit: 2017-05-27 | Discharge: 2017-05-27 | Disposition: A | Discharge status: Home / Self Care | Payer: No Typology Code available for payment source | Attending: Emergency Medicine | Admitting: Emergency Medicine

## 2017-05-27 ENCOUNTER — Encounter (HOSPITAL_COMMUNITY): Payer: Self-pay | Admitting: Emergency Medicine

## 2017-05-27 DIAGNOSIS — J219 Acute bronchiolitis, unspecified: Secondary | ICD-10-CM | POA: Insufficient documentation

## 2017-05-27 DIAGNOSIS — J209 Acute bronchitis, unspecified: Secondary | ICD-10-CM | POA: Insufficient documentation

## 2017-05-27 MED ORDER — PREDNISONE 20 MG PO TABS
60.0000 mg | ORAL_TABLET | Freq: Once | ORAL | Status: AC
  Administered 2017-05-27: 60 mg via ORAL
  Filled 2017-05-27: qty 3

## 2017-05-27 MED ORDER — AEROCHAMBER PLUS FLO-VU LARGE MISC: 1.0000 | Freq: Once | Status: DC

## 2017-05-27 MED ORDER — PREDNISONE 10 MG PO TABS
20.0000 mg | ORAL_TABLET | Freq: Every day | ORAL | 0 refills | Status: DC
Start: 2017-05-27 — End: 2017-06-12

## 2017-05-27 MED ORDER — AEROCHAMBER PLUS FLO-VU LARGE MISC
Status: AC
  Filled 2017-05-27: qty 1

## 2017-05-27 MED ORDER — ALBUTEROL SULFATE (2.5 MG/3ML) 0.083% IN NEBU: 5.0000 mg | INHALATION_SOLUTION | Freq: Once | RESPIRATORY_TRACT | Status: DC

## 2017-05-27 MED ORDER — ALBUTEROL SULFATE (2.5 MG/3ML) 0.083% IN NEBU
2.5000 mg | INHALATION_SOLUTION | Freq: Once | RESPIRATORY_TRACT | Status: AC
  Administered 2017-05-27: 2.5 mg via RESPIRATORY_TRACT
  Filled 2017-05-27: qty 3

## 2017-05-27 MED ORDER — IPRATROPIUM-ALBUTEROL 0.5-2.5 (3) MG/3ML IN SOLN
3.0000 mL | Freq: Once | RESPIRATORY_TRACT | Status: AC
  Administered 2017-05-27: 3 mL via RESPIRATORY_TRACT

## 2017-05-27 MED ORDER — ALBUTEROL SULFATE HFA 108 (90 BASE) MCG/ACT IN AERS
2.0000 | INHALATION_SPRAY | RESPIRATORY_TRACT | Status: DC | PRN
  Administered 2017-05-27: 2 via RESPIRATORY_TRACT
  Filled 2017-05-27: qty 6.7

## 2017-05-27 NOTE — ED Notes (Signed)
Pt in radiology at this time. 
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
## 2017-05-27 NOTE — Discharge Instructions (Signed)
Take the Prednisone as directed until all tablets taken   Use the inhaler as follows: 2 puffs every 4-6 hours for 2 days than as needed

## 2017-05-27 NOTE — Telephone Encounter (Signed)
Left message for patient to call back with how she is feeling

## 2017-05-27 NOTE — ED Triage Notes (Signed)
Pt report sob, she has not had any relief from her inhaler.  Pt denies weakness, diaphoresis, LOC.

## 2017-06-08 ENCOUNTER — Encounter (HOSPITAL_COMMUNITY): Payer: Self-pay

## 2017-06-08 ENCOUNTER — Emergency Department (HOSPITAL_COMMUNITY)
Admission: EM | Admit: 2017-06-08 | Discharge: 2017-06-08 | Disposition: A | Discharge status: Home / Self Care | Payer: Self-pay | Attending: Emergency Medicine | Admitting: Emergency Medicine

## 2017-06-08 ENCOUNTER — Emergency Department (HOSPITAL_COMMUNITY): Payer: Self-pay

## 2017-06-08 DIAGNOSIS — K529 Noninfective gastroenteritis and colitis, unspecified: Secondary | ICD-10-CM | POA: Insufficient documentation

## 2017-06-08 LAB — COMPREHENSIVE METABOLIC PANEL
ALBUMIN: 3.9 g/dL (ref 3.5–5.0)
ALT: 50 U/L (ref 14–54)
ANION GAP: 8 (ref 5–15)
AST: 41 U/L (ref 15–41)
Alkaline Phosphatase: 91 U/L (ref 38–126)
BUN: 12 mg/dL (ref 6–20)
CO2: 25 mmol/L (ref 22–32)
Calcium: 8.8 mg/dL — ABNORMAL LOW (ref 8.9–10.3)
Chloride: 105 mmol/L (ref 101–111)
Creatinine, Ser: 0.81 mg/dL (ref 0.44–1.00)
GFR calc Af Amer: >60 mL/min (ref >60)
Glucose, Bld: 114 mg/dL — ABNORMAL HIGH (ref 65–99)
POTASSIUM: 3.1 mmol/L — AB (ref 3.5–5.1)
Sodium: 138 mmol/L (ref 135–145)
TOTAL PROTEIN: 6.8 g/dL (ref 6.5–8.1)
Total Bilirubin: 0.5 mg/dL (ref 0.3–1.2)

## 2017-06-08 LAB — LIPASE, BLOOD: Lipase: 41 U/L (ref 11–51)

## 2017-06-08 LAB — URINALYSIS, ROUTINE W REFLEX MICROSCOPIC
Bilirubin Urine: NEGATIVE
GLUCOSE, UA: NEGATIVE mg/dL
Hgb urine dipstick: NEGATIVE
KETONES UR: NEGATIVE mg/dL
LEUKOCYTES UA: NEGATIVE
NITRITE: NEGATIVE
PH: 6 (ref 5.0–8.0)
Protein, ur: NEGATIVE mg/dL
SPECIFIC GRAVITY, URINE: 1.015 (ref 1.005–1.030)

## 2017-06-08 LAB — CBC
HEMATOCRIT: 33.7 % — AB (ref 36.0–46.0)
HEMOGLOBIN: 11.2 g/dL — AB (ref 12.0–15.0)
MCH: 28.2 pg (ref 26.0–34.0)
MCHC: 33.2 g/dL (ref 30.0–36.0)
MCV: 84.9 fL (ref 78.0–100.0)
Platelets: 310 10*3/uL (ref 150–400)
RBC: 3.97 MIL/uL (ref 3.87–5.11)
RDW: 13.8 % (ref 11.5–15.5)
WBC: 10.3 10*3/uL (ref 4.0–10.5)

## 2017-06-08 MED ORDER — PROMETHAZINE HCL 25 MG PO TABS: 25.0000 mg | ORAL_TABLET | Freq: Four times a day (QID) | ORAL | 0 refills | Status: DC | PRN

## 2017-06-08 MED ORDER — FLUCONAZOLE 150 MG PO TABS: 150.0000 mg | ORAL_TABLET | Freq: Once | ORAL | 0 refills | Status: AC

## 2017-06-08 MED ORDER — CIPROFLOXACIN HCL 500 MG PO TABS: ORAL_TABLET | ORAL | 0 refills | Status: DC

## 2017-06-08 MED ORDER — METRONIDAZOLE 500 MG PO TABS
500.0000 mg | ORAL_TABLET | Freq: Once | ORAL | Status: AC
  Administered 2017-06-08: 500 mg via ORAL
  Filled 2017-06-08: qty 1

## 2017-06-08 MED ORDER — CIPROFLOXACIN HCL 500 MG PO TABS
500.0000 mg | ORAL_TABLET | Freq: Once | ORAL | Status: AC
  Administered 2017-06-08: 500 mg via ORAL
  Filled 2017-06-08: qty 1

## 2017-06-08 MED ORDER — HYDROMORPHONE HCL 1 MG/ML IJ SOLN
1.0000 mg | Freq: Once | INTRAMUSCULAR | Status: AC
  Administered 2017-06-08: 1 mg via INTRAVENOUS
  Filled 2017-06-08: qty 1

## 2017-06-08 MED ORDER — IOPAMIDOL (ISOVUE-370) INJECTION 76%
INTRAVENOUS | Status: AC
  Filled 2017-06-08: qty 100

## 2017-06-08 MED ORDER — METRONIDAZOLE 500 MG PO TABS: ORAL_TABLET | ORAL | 0 refills | Status: DC

## 2017-06-08 MED ORDER — TRAMADOL HCL 50 MG PO TABS: 50.0000 mg | ORAL_TABLET | Freq: Four times a day (QID) | ORAL | 0 refills | Status: DC | PRN

## 2017-06-08 MED ORDER — IOPAMIDOL (ISOVUE-300) INJECTION 61%
INTRAVENOUS | Status: AC
  Administered 2017-06-08: 100 mL via INTRAVENOUS
  Filled 2017-06-08: qty 100

## 2017-06-08 MED ORDER — ONDANSETRON HCL 4 MG/2ML IJ SOLN
4.0000 mg | Freq: Once | INTRAMUSCULAR | Status: AC
  Administered 2017-06-08: 4 mg via INTRAVENOUS
  Filled 2017-06-08: qty 2

## 2017-06-08 NOTE — Discharge Instructions (Signed)
Follow-up with Dr. Paulita Fujita next week. Also follow-up with a primary care doctor in the next couple weeks to arrange an ultrasound of your pelvis.

## 2017-06-08 NOTE — ED Triage Notes (Signed)
Pt complaining of L lower abdominal pain. Pt denies any urinary symptoms. Pt denies any vaginal bleeding or discharge. Pt complaining of nausea, denies any emesis or diarrhea.

## 2017-06-08 NOTE — ED Notes (Signed)
Per CT, pt to be transported to scan next.

## 2017-06-08 NOTE — ED Notes (Signed)
ED Provider at bedside. 

## 2017-06-08 NOTE — ED Notes (Signed)
Pt returned from CT °

## 2017-06-08 NOTE — ED Notes (Signed)
Pt ambulatory to restroom accompanied by this nurse.
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
## 2017-06-08 NOTE — ED Notes (Signed)
Patient transported to CT 

## 2017-06-10 ENCOUNTER — Telehealth: Payer: Self-pay

## 2017-06-10 ENCOUNTER — Other Ambulatory Visit: Payer: Self-pay | Admitting: Internal Medicine

## 2017-06-10 DIAGNOSIS — N9489 Other specified conditions associated with female genital organs and menstrual cycle: Secondary | ICD-10-CM

## 2017-06-10 DIAGNOSIS — R933 Abnormal findings on diagnostic imaging of other parts of digestive tract: Secondary | ICD-10-CM

## 2017-06-10 NOTE — Telephone Encounter (Signed)
Patient called stating she was sen in ER on Saturday for abdominal pain. States a CT scan was done and doctor told her she needed a colonoscopy immediately follow ing the CT scan. Patient would like for Dr. Amil Amen to look at chart and if colonoscopy is needed patient states she sees Dr. Paulita Fujita. To Dr. Amil Amen for further direction.

## 2017-06-10 NOTE — Telephone Encounter (Signed)
Called and spoke with radiologist:  Similar finding with colon in 2011 CT scan from what I could tell.  Radiologist states not in the same region.   Patient had a colonoscopy in 2011 that was reportedly normal with Dr. Paulita Fujita. 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  Patient states she went back and forth to [facility] for this and finally decided to quit following up after reading something on the internet. Finally, has a right adnexal mass.  This is much larger than the cystic lesion also noted on right ovary in 2011. She will need a referral to Dr. Paulita Fujita for the colon findings, which apparently are quite subtle and for the renal and ovarian masses.   Will need to send her back to [facility] for the possibility of surgery for at least the right renal mass.Please notify patient this is all in the works (have already told her this was likely, but explain the conversation with radiology about the findings please)

## 2017-06-10 NOTE — Telephone Encounter (Signed)
Spoke with patient. Informed Per Dr. Amil Amen wants her to have a MRI first before going to [facility]. Patient understood. Patient also wants to know what is an alternative to taking pain medication. States she does not like putting that kind of medication in her system. Patient also asking for appointment to come into office. States she is due to go back to work on Wednesday and she is still in a lot of pain. 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 To Dr. Amil Amen for further direction.

## 2017-06-10 NOTE — Progress Notes (Unsigned)
More information (see previous telephone notes regarding CT findings of patient:  She has actually not yet started her Cipro and Metronidazole from ED visit, so would not expect her symptoms to improve. Referring to Dr. Paulita Fujita, though is she improves quickly with her antibiotics, would not expect need for colonoscopy as she had an essentially normal colonoscopy in 2011. Will refer to [facility] for pelvic ultrasound to further evaluate right adnexal mass,which along with right renal mass. Has been seen at least since 2011, the adnexal mass has been enlarging.

## 2017-06-11 NOTE — Progress Notes (Signed)
Spoke with patient informed she is now having pain in front and back. Per Dr. Amil Amen have patient call tomorrow with how she is feeling. If not feeling better then needs to come into office to be seen. Patient infomed of message from Dr. Amil Amen. Patient verbalized understanding.

## 2017-06-12 ENCOUNTER — Encounter: Payer: Self-pay | Admitting: Internal Medicine

## 2017-06-12 ENCOUNTER — Ambulatory Visit (INDEPENDENT_AMBULATORY_CARE_PROVIDER_SITE_OTHER): Payer: Self-pay | Admitting: Internal Medicine

## 2017-06-12 VITALS — BP 130/88 | HR 84 | Resp 12 | Ht 62.0 in | Wt 182.0 lb

## 2017-06-12 DIAGNOSIS — R109 Unspecified abdominal pain: Secondary | ICD-10-CM

## 2017-06-12 LAB — POCT URINALYSIS DIPSTICK
BILIRUBIN UA: NEGATIVE
Blood, UA: NEGATIVE
Glucose, UA: NEGATIVE
KETONES UA: 15
Nitrite, UA: NEGATIVE
PROTEIN UA: 15
SPEC GRAV UA: 1.015 (ref 1.010–1.025)
Urobilinogen, UA: 0.2 E.U./dL
pH, UA: 8 (ref 5.0–8.0)

## 2017-06-12 MED ORDER — HYDROCODONE-ACETAMINOPHEN 5-325 MG PO TABS: ORAL_TABLET | ORAL | 0 refills | Status: DC

## 2017-06-12 NOTE — Progress Notes (Signed)
Subjective:    Patient ID: Katherine Miller, female    DOB: 1956-12-03, 61 y.o.   MRN: [id number]  HPI   Patient seen in ED on Saturday, June 16th and diagnosed with a localized descending colitis.  Also on CT of abdomen and pelvis, found to have a chronic right kidney mass, felt to be likely malignant.  See #2.  Also, noted to have right adnexal or ovarian mass. Patient was prescribed Cipro and Flagyl, but did not start on the meds until a day and a half ago.   No definite fever.  Has checked orally with top level of 99.   Pain in LLQ for which she was seen is really the same.  Now she is having RLQ pain, which started yesterday evening.   This is a sharp pain that is constant with worsening at times.   She is not aware of anything causing the worsening pain--no particular movement. She did have bad pain with lifting about a 10 lb bag of groceries yesterday, but that was across her back.   Is having nausea, but no vomiting.   No diarrhea.  Last BM was just before coming in:  Formed without melena or hematochezia. She has had dark orange urine.  She is not sure if was blood.  States her urine had this appearance with first urination this morning.   No dysuria or urinary frequency No flank pain.  2.  Right kidney mass:  Was being followed at [facility] and was not changing in size.  Went from 2010 to 2013.  She stopped going as the mass was not changing.  Apparently one of the attendings talked about biopsy.  3.  Right adnexa or ovarian mass:  Has had this also for a number of years and appears to be enlarging.  We are setting her up with a pelvic ultrasound to further evaluate.   Current Meds  Medication Sig  . ciprofloxacin (CIPRO) 500 MG tablet One po bid  . metroNIDAZOLE (FLAGYL) 500 MG tablet Take one tid for 10 days  . [DISCONTINUED] promethazine (PHENERGAN) 25 MG tablet Take 1 tablet (25 mg total) by mouth every 6 (six) hours as needed for nausea or vomiting.  .  [DISCONTINUED] traMADol (ULTRAM) 50 MG tablet Take 1 tablet (50 mg total) by mouth every 6 (six) hours as needed.    No Known Allergies     Review of Systems     Objective:   Physical Exam Intermittently in pain more so from RLQ area--winces and grabs area. Lungs;  CTA CV:  RRR without murmur or rub, radial pulses normal and equal Abd:  S, very tender along left mid abdomen, not really tender in low left quadrant.  No suprapubic tenderness.  Very tender over right mid abdomen to right lower quadrant.  + BS, NO HSM or mass palpated.       Assessment & Plan:  1.  Left abdominal pain with what looks like a focal colitis.  Was recommended to have a colonoscopy as well as there was such limited involvement of descending colon. That referral has been sent to Dr. Paulita Fujita who performed a colonoscopy in 2011.  Has not yet been on Flagyl and Cipro for a total of 48 hours, so would not expect significant improvement yet.  Normal BM before coming in is a good sign.  2.  Right abdominal pain :  Noted to have small right kidney stone, nonobstructive as well as right adnexal mass  that has been enlarging over the years.  Patient already planned for pelvic ultrasound.  Would like to look at right kidney and stone with this same exam.  CT 5 days ago.  UA today shows + ketones, protein and small leuks, neg nitrites or blood in urine however.   She is to drink lots of water and take Hydrocodone in case this is the renal stone trying to pass.  Discussed getting a small sieve to urinate into in case she does pass a stone.

## 2017-06-12 NOTE — Patient Instructions (Signed)
Call if pain worsens or fever.

## 2017-06-13 ENCOUNTER — Other Ambulatory Visit: Payer: Self-pay

## 2017-06-13 NOTE — Telephone Encounter (Signed)
Please check on Friday to see how Katherine Miller is doing --if she is taking hydrocodone, how often?  Where is her pain?

## 2017-06-14 LAB — URINE CULTURE: ORGANISM ID, BACTERIA: NO GROWTH

## 2017-06-14 NOTE — Telephone Encounter (Signed)
Left message for patient to call back with update on how she is feeling.

## 2017-06-20 ENCOUNTER — Ambulatory Visit (HOSPITAL_COMMUNITY)
Admission: RE | Admit: 2017-06-20 | Discharge: 2017-06-20 | Disposition: A | Discharge status: Home / Self Care | Payer: Self-pay | Source: Ambulatory Visit | Attending: Internal Medicine | Admitting: Internal Medicine

## 2017-06-20 DIAGNOSIS — N9489 Other specified conditions associated with female genital organs and menstrual cycle: Secondary | ICD-10-CM

## 2017-06-20 DIAGNOSIS — N83291 Other ovarian cyst, right side: Secondary | ICD-10-CM | POA: Insufficient documentation

## 2017-06-20 DIAGNOSIS — Z90710 Acquired absence of both cervix and uterus: Secondary | ICD-10-CM | POA: Insufficient documentation

## 2017-06-20 DIAGNOSIS — N2889 Other specified disorders of kidney and ureter: Secondary | ICD-10-CM | POA: Insufficient documentation

## 2017-06-20 DIAGNOSIS — R102 Pelvic and perineal pain: Secondary | ICD-10-CM | POA: Insufficient documentation

## 2017-06-20 DIAGNOSIS — R109 Unspecified abdominal pain: Secondary | ICD-10-CM

## 2017-06-20 NOTE — Addendum Note (Signed)
Addended by: Serafina Mitchell on: 06/20/2017 08:38 AM   Modules accepted: Orders

## 2017-06-27 ENCOUNTER — Encounter: Payer: Self-pay | Admitting: Internal Medicine

## 2017-06-27 ENCOUNTER — Telehealth: Payer: Self-pay

## 2017-06-27 ENCOUNTER — Other Ambulatory Visit: Payer: Self-pay | Admitting: Internal Medicine

## 2017-06-27 DIAGNOSIS — N2889 Other specified disorders of kidney and ureter: Secondary | ICD-10-CM

## 2017-06-27 DIAGNOSIS — N838 Other noninflammatory disorders of ovary, fallopian tube and broad ligament: Secondary | ICD-10-CM

## 2017-06-27 HISTORY — DX: Other noninflammatory disorders of ovary, fallopian tube and broad ligament: N83.8

## 2017-06-27 NOTE — Telephone Encounter (Signed)
Patient calling asking for results of renal and pelvis ultrasound. Patient had test done on 06/20/17. To Dr. Amil Amen for further direction.

## 2017-06-27 NOTE — Telephone Encounter (Signed)
See notes attached to ultrasound results

## 2017-06-27 NOTE — Progress Notes (Signed)
Checking on records from [facility] urology vs. Oncology for renal mass and referring locally to urology as well as gyn

## 2017-07-04 ENCOUNTER — Telehealth: Payer: Self-pay

## 2017-07-04 DIAGNOSIS — N838 Other noninflammatory disorders of ovary, fallopian tube and broad ligament: Secondary | ICD-10-CM

## 2017-07-04 NOTE — Telephone Encounter (Signed)
Called to set up OB-GYN appointment and had to correct order.

## 2017-07-20 ENCOUNTER — Emergency Department (HOSPITAL_COMMUNITY)
Admission: EM | Admit: 2017-07-20 | Discharge: 2017-07-20 | Disposition: A | Discharge status: Home / Self Care | Payer: 59 | Attending: Emergency Medicine | Admitting: Emergency Medicine

## 2017-07-20 ENCOUNTER — Encounter (HOSPITAL_COMMUNITY): Payer: Self-pay | Admitting: Emergency Medicine

## 2017-07-20 DIAGNOSIS — R112 Nausea with vomiting, unspecified: Secondary | ICD-10-CM | POA: Diagnosis not present

## 2017-07-20 DIAGNOSIS — R1032 Left lower quadrant pain: Secondary | ICD-10-CM | POA: Diagnosis present

## 2017-07-20 DIAGNOSIS — Z79899 Other long term (current) drug therapy: Secondary | ICD-10-CM | POA: Diagnosis not present

## 2017-07-20 DIAGNOSIS — R110 Nausea: Secondary | ICD-10-CM

## 2017-07-20 LAB — COMPREHENSIVE METABOLIC PANEL WITH GFR
ALT: 36 U/L (ref 14–54)
AST: 29 U/L (ref 15–41)
Albumin: 4.1 g/dL (ref 3.5–5.0)
Alkaline Phosphatase: 100 U/L (ref 38–126)
Anion gap: 9 (ref 5–15)
BUN: 13 mg/dL (ref 6–20)
CO2: 23 mmol/L (ref 22–32)
Calcium: 9 mg/dL (ref 8.9–10.3)
Chloride: 110 mmol/L (ref 101–111)
Creatinine, Ser: 0.69 mg/dL (ref 0.44–1.00)
GFR calc Af Amer: >60 mL/min
GFR calc non Af Amer: >60 mL/min
Glucose, Bld: 91 mg/dL (ref 65–99)
Potassium: 3.5 mmol/L (ref 3.5–5.1)
Sodium: 142 mmol/L (ref 135–145)
Total Bilirubin: 0.2 mg/dL — ABNORMAL LOW (ref 0.3–1.2)
Total Protein: 7.5 g/dL (ref 6.5–8.1)

## 2017-07-20 LAB — LIPASE, BLOOD: Lipase: 19 U/L (ref 11–51)

## 2017-07-20 LAB — URINALYSIS, ROUTINE W REFLEX MICROSCOPIC
BILIRUBIN URINE: NEGATIVE
Glucose, UA: NEGATIVE mg/dL
HGB URINE DIPSTICK: NEGATIVE
KETONES UR: NEGATIVE mg/dL
LEUKOCYTES UA: NEGATIVE
NITRITE: NEGATIVE
PROTEIN: NEGATIVE mg/dL
Specific Gravity, Urine: 1.02 (ref 1.005–1.030)
pH: 5 (ref 5.0–8.0)

## 2017-07-20 LAB — CBC
HCT: 35.9 % — ABNORMAL LOW (ref 36.0–46.0)
Hemoglobin: 12.3 g/dL (ref 12.0–15.0)
MCH: 28.4 pg (ref 26.0–34.0)
MCHC: 34.3 g/dL (ref 30.0–36.0)
MCV: 82.9 fL (ref 78.0–100.0)
Platelets: 261 10*3/uL (ref 150–400)
RBC: 4.33 MIL/uL (ref 3.87–5.11)
RDW: 13.3 % (ref 11.5–15.5)
WBC: 5.7 10*3/uL (ref 4.0–10.5)

## 2017-07-20 MED ORDER — MORPHINE SULFATE (PF) 2 MG/ML IV SOLN
4.0000 mg | Freq: Once | INTRAVENOUS | Status: AC
  Administered 2017-07-20: 4 mg via INTRAVENOUS
  Filled 2017-07-20: qty 2

## 2017-07-20 MED ORDER — SODIUM CHLORIDE 0.9 % IV BOLUS (SEPSIS)
1000.0000 mL | Freq: Once | INTRAVENOUS | Status: AC
  Administered 2017-07-20: 1000 mL via INTRAVENOUS

## 2017-07-20 MED ORDER — ONDANSETRON 4 MG PO TBDP
4.0000 mg | ORAL_TABLET | Freq: Once | ORAL | Status: AC
  Administered 2017-07-20: 4 mg via ORAL
  Filled 2017-07-20: qty 1

## 2017-07-20 NOTE — ED Triage Notes (Signed)
Abdominal pain and bloating x 2 weeks. Pain and pressure radiating from l/lower to l/back. Recent diagnosis of cysts on r/ovary. Reports nausea. Denies N/V. Pt is alert, oriented and ambulatory. Husband at bedside
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
## 2017-07-20 NOTE — Discharge Instructions (Signed)
Your blood work was reassuring. Your urine did not show any evidence of infection. Please keep your appointment for Tuesday with OB/GYN. Please call [facility] and be seen for follow-up. For pain control you may take:  800mg  of ibuprofen (that is usually 4 over the counter pills)  3 times a day (take with food) and acetaminophen 975mg  (this is 3 over the counter pills) four times a day. Do not drink alcohol or combine with other medications that have acetaminophen as an ingredient (Read the labels!).  For breakthrough pain you may take tramadol. Do not drink alcohol drive or operate heavy machinery when taking tramadol. You may take nausea medication as needed you have at home.  Please seek immediate care if you have any develop any of the following symptoms: The pain does not go away.  You have a fever.  You keep throwing up (vomiting).  The pain is felt only in portions of the abdomen. Pain in the right side could possibly be appendicitis. In an adult, pain in the left lower portion of the abdomen could be colitis or diverticulitis.  You pass bloody or black tarry stools.  There is bright red blood in the stool.  The constipation stays for more than 4 days.  There is belly (abdominal) or rectal pain.  You do not seem to be getting better.  You have any questions or concerns.

## 2017-08-15 ENCOUNTER — Telehealth: Payer: Self-pay | Admitting: Radiology

## 2017-08-15 NOTE — Telephone Encounter (Signed)
Left voicemail on cell phone to call and schedule appointment for Dr Harolyn Rutherford. Referred from another physician.

## 2017-08-19 ENCOUNTER — Telehealth (HOSPITAL_COMMUNITY): Payer: Self-pay

## 2017-08-19 NOTE — Telephone Encounter (Signed)
Called patient to remind her about completing the at home FIT test that was given to her in [place] on 04/30/17. Patient informed me that she has had a colonoscopy done since then.

## 2017-10-07 ENCOUNTER — Ambulatory Visit (INDEPENDENT_AMBULATORY_CARE_PROVIDER_SITE_OTHER): Payer: Worker's Compensation | Admitting: Orthopaedic Surgery

## 2017-10-07 ENCOUNTER — Ambulatory Visit (INDEPENDENT_AMBULATORY_CARE_PROVIDER_SITE_OTHER): Payer: Worker's Compensation

## 2017-10-07 ENCOUNTER — Encounter (INDEPENDENT_AMBULATORY_CARE_PROVIDER_SITE_OTHER): Payer: Self-pay | Admitting: Orthopaedic Surgery

## 2017-10-07 DIAGNOSIS — M25532 Pain in left wrist: Secondary | ICD-10-CM | POA: Diagnosis not present

## 2017-10-07 DIAGNOSIS — M25512 Pain in left shoulder: Secondary | ICD-10-CM

## 2017-10-07 MED ORDER — IBUPROFEN 800 MG PO TABS: 800.0000 mg | ORAL_TABLET | Freq: Three times a day (TID) | ORAL | 0 refills | Status: DC | PRN
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
## 2017-11-11 ENCOUNTER — Encounter (HOSPITAL_COMMUNITY): Payer: Self-pay | Admitting: *Deleted

## 2017-11-11 ENCOUNTER — Emergency Department (HOSPITAL_COMMUNITY)
Admission: EM | Admit: 2017-11-11 | Discharge: 2017-11-12 | Disposition: A | Discharge status: Home / Self Care | Payer: 59 | Attending: Emergency Medicine | Admitting: Emergency Medicine

## 2017-11-11 DIAGNOSIS — Z79899 Other long term (current) drug therapy: Secondary | ICD-10-CM | POA: Insufficient documentation

## 2017-11-11 DIAGNOSIS — M25512 Pain in left shoulder: Secondary | ICD-10-CM | POA: Insufficient documentation

## 2017-11-11 DIAGNOSIS — F419 Anxiety disorder, unspecified: Secondary | ICD-10-CM | POA: Insufficient documentation

## 2017-11-11 DIAGNOSIS — Z9049 Acquired absence of other specified parts of digestive tract: Secondary | ICD-10-CM | POA: Insufficient documentation

## 2017-11-11 MED ORDER — DEXAMETHASONE SODIUM PHOSPHATE 10 MG/ML IJ SOLN
10.0000 mg | Freq: Once | INTRAMUSCULAR | Status: AC
  Administered 2017-11-11: 10 mg via INTRAMUSCULAR
  Filled 2017-11-11: qty 1

## 2017-11-11 MED ORDER — DICLOFENAC SODIUM 1 % TD GEL
2.0000 g | Freq: Once | TRANSDERMAL | Status: AC
  Administered 2017-11-11: 2 g via TOPICAL
  Filled 2017-11-11: qty 100

## 2017-11-11 NOTE — ED Notes (Signed)
ED Provider at bedside. 

## 2017-11-11 NOTE — Discharge Instructions (Signed)
Continue to use Voltaren gel on the shoulder for pain, I would also recommend trying ice and heat and seen which one provides more benefit. You can use the sling as needed for comfort, ibuprofen or Tylenol as needed for additional pain relief. I would also recommend you start wearing a wrist brace again. You may also try Solanpas for the shoulder pain. Please call tomorrow morning to schedule a follow-up appointment with [facility], he may also follow-up with her neurologist office provided. If you have significantly worsened pain, or unable to move the shoulder, have persistent weakness or numbness in the left arm please return to the ED for reevaluation.
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
## 2017-11-12 NOTE — ED Notes (Signed)
Pt departed in NAD, refused use of wheelchair.  

## 2018-02-05 ENCOUNTER — Emergency Department (HOSPITAL_COMMUNITY): Payer: Self-pay

## 2018-02-05 ENCOUNTER — Other Ambulatory Visit: Payer: Self-pay

## 2018-02-05 ENCOUNTER — Encounter (HOSPITAL_COMMUNITY): Payer: Self-pay | Admitting: Emergency Medicine

## 2018-02-05 DIAGNOSIS — Z79899 Other long term (current) drug therapy: Secondary | ICD-10-CM | POA: Insufficient documentation

## 2018-02-05 DIAGNOSIS — R0789 Other chest pain: Secondary | ICD-10-CM | POA: Insufficient documentation

## 2018-02-05 LAB — CBC
HEMATOCRIT: 36.4 % (ref 36.0–46.0)
HEMOGLOBIN: 12.4 g/dL (ref 12.0–15.0)
MCH: 29.1 pg (ref 26.0–34.0)
MCHC: 34.1 g/dL (ref 30.0–36.0)
MCV: 85.4 fL (ref 78.0–100.0)
Platelets: 290 10*3/uL (ref 150–400)
RBC: 4.26 MIL/uL (ref 3.87–5.11)
RDW: 14.2 % (ref 11.5–15.5)
WBC: 6.5 10*3/uL (ref 4.0–10.5)

## 2018-02-05 LAB — COMPREHENSIVE METABOLIC PANEL
ALT: 56 U/L — ABNORMAL HIGH (ref 14–54)
AST: 52 U/L — ABNORMAL HIGH (ref 15–41)
Albumin: 3.7 g/dL (ref 3.5–5.0)
Alkaline Phosphatase: 117 U/L (ref 38–126)
Anion gap: 11 (ref 5–15)
BUN: 9 mg/dL (ref 6–20)
CALCIUM: 8.7 mg/dL — AB (ref 8.9–10.3)
CHLORIDE: 106 mmol/L (ref 101–111)
CO2: 22 mmol/L (ref 22–32)
Creatinine, Ser: 0.79 mg/dL (ref 0.44–1.00)
GFR calc non Af Amer: >60 mL/min (ref >60)
Glucose, Bld: 95 mg/dL (ref 65–99)
Potassium: 3.3 mmol/L — ABNORMAL LOW (ref 3.5–5.1)
SODIUM: 139 mmol/L (ref 135–145)
Total Bilirubin: 0.5 mg/dL (ref 0.3–1.2)
Total Protein: 6.8 g/dL (ref 6.5–8.1)

## 2018-02-05 LAB — URINALYSIS, ROUTINE W REFLEX MICROSCOPIC
Bilirubin Urine: NEGATIVE
Glucose, UA: NEGATIVE mg/dL
Hgb urine dipstick: NEGATIVE
KETONES UR: NEGATIVE mg/dL
Leukocytes, UA: NEGATIVE
NITRITE: NEGATIVE
PH: 7 (ref 5.0–8.0)
Protein, ur: NEGATIVE mg/dL
SPECIFIC GRAVITY, URINE: 1.02 (ref 1.005–1.030)

## 2018-02-05 LAB — LIPASE, BLOOD: LIPASE: 25 U/L (ref 11–51)

## 2018-02-05 LAB — I-STAT BETA HCG BLOOD, ED (MC, WL, AP ONLY)

## 2018-02-05 LAB — I-STAT TROPONIN, ED: Troponin i, poc: 0 ng/mL (ref 0.00–0.08)

## 2018-02-05 NOTE — ED Triage Notes (Signed)
Pt to ED with multiple complaints - states she's been having epigastric pain and chest tightness for a few weeks, then began having really bad acid reflux, nausea, and then nosebleeds and headaches. She took her BP at home once and got a reading of 165/107 - sts no hx HTN. She does report some anxiety off and on, which she has taken Vistaril for but she hasn't had an episode in about 6-8 months. Patient reports these symptoms were worse today. Endorses increased stress level. Denies SOB. Resp e/u, skin warm/dry.

## 2018-02-06 ENCOUNTER — Emergency Department (HOSPITAL_COMMUNITY)
Admission: EM | Admit: 2018-02-06 | Discharge: 2018-02-06 | Disposition: A | Discharge status: Home / Self Care | Payer: Self-pay | Attending: Emergency Medicine | Admitting: Emergency Medicine

## 2018-02-06 ENCOUNTER — Other Ambulatory Visit: Payer: Self-pay

## 2018-02-06 DIAGNOSIS — R0789 Other chest pain: Secondary | ICD-10-CM

## 2018-02-06 LAB — I-STAT TROPONIN, ED: Troponin i, poc: 0 ng/mL (ref 0.00–0.08)

## 2018-02-06 MED ORDER — ASPIRIN 81 MG PO CHEW
324.0000 mg | CHEWABLE_TABLET | Freq: Once | ORAL | Status: AC
  Administered 2018-02-06: 324 mg via ORAL
  Filled 2018-02-06: qty 4

## 2018-02-06 MED ORDER — HYDROXYZINE HCL 25 MG PO TABS: 25.0000 mg | ORAL_TABLET | Freq: Four times a day (QID) | ORAL | 0 refills | Status: DC

## 2018-02-06 MED ORDER — HYDROXYZINE HCL 25 MG PO TABS
25.0000 mg | ORAL_TABLET | Freq: Once | ORAL | Status: AC
Start: 2018-02-06 — End: 2018-02-06
  Administered 2018-02-06: 25 mg via ORAL
  Filled 2018-02-06: qty 1

## 2018-02-06 MED ORDER — PANTOPRAZOLE SODIUM 40 MG PO TBEC
40.0000 mg | DELAYED_RELEASE_TABLET | Freq: Once | ORAL | Status: AC
Start: 2018-02-06 — End: 2018-02-06
  Administered 2018-02-06: 40 mg via ORAL
  Filled 2018-02-06: qty 1

## 2018-02-06 NOTE — Discharge Instructions (Signed)
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
## 2018-03-16 ENCOUNTER — Encounter (HOSPITAL_COMMUNITY): Payer: Self-pay | Admitting: Emergency Medicine

## 2018-03-16 ENCOUNTER — Emergency Department (HOSPITAL_COMMUNITY)
Admission: EM | Admit: 2018-03-16 | Discharge: 2018-03-16 | Disposition: A | Discharge status: Home / Self Care | Payer: Self-pay | Attending: Emergency Medicine | Admitting: Emergency Medicine

## 2018-03-16 DIAGNOSIS — F419 Anxiety disorder, unspecified: Secondary | ICD-10-CM | POA: Insufficient documentation

## 2018-03-16 DIAGNOSIS — Z79899 Other long term (current) drug therapy: Secondary | ICD-10-CM | POA: Insufficient documentation

## 2018-03-16 DIAGNOSIS — G4700 Insomnia, unspecified: Secondary | ICD-10-CM | POA: Insufficient documentation

## 2018-03-16 MED ORDER — HYDROXYZINE HCL 25 MG PO TABS: 25.0000 mg | ORAL_TABLET | Freq: Three times a day (TID) | ORAL | 0 refills | Status: DC | PRN

## 2018-03-16 NOTE — ED Triage Notes (Signed)
Pt reports she is having issues w/ anxiety.  She requests visteral and ambien. Does not have insurance and is experience situational stressors including the loss of her nephew.
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
## 2018-03-16 NOTE — Discharge Instructions (Signed)
Take Vistaril 3 times a day as needed for anxiety or sleep. Make sure you are staying well-hydrated while taking this medicine, as it can make you dehydrated. Call the 2 numbers listed below tomorrow to discuss follow-up and assistance with medications. Call the [phone] number the back of the paperwork to help you establish primary care. Return to the emergency room if you develop suicidal thoughts, difficulty breathing, or any new or concerning symptoms.

## 2018-03-16 NOTE — ED Notes (Signed)
Pt verbalizes understanding of d/c instructions. Pt received prescriptions. Pt ambulatory at d/c with all belongings.  

## 2018-04-14 ENCOUNTER — Emergency Department (HOSPITAL_COMMUNITY)
Admission: EM | Admit: 2018-04-14 | Discharge: 2018-04-14 | Disposition: A | Discharge status: Home / Self Care | Payer: Self-pay | Attending: Emergency Medicine | Admitting: Emergency Medicine

## 2018-04-14 ENCOUNTER — Emergency Department (HOSPITAL_COMMUNITY): Payer: Self-pay

## 2018-04-14 ENCOUNTER — Encounter (HOSPITAL_COMMUNITY): Payer: Self-pay | Admitting: Emergency Medicine

## 2018-04-14 DIAGNOSIS — J189 Pneumonia, unspecified organism: Secondary | ICD-10-CM

## 2018-04-14 DIAGNOSIS — J181 Lobar pneumonia, unspecified organism: Secondary | ICD-10-CM | POA: Insufficient documentation

## 2018-04-14 DIAGNOSIS — Z79899 Other long term (current) drug therapy: Secondary | ICD-10-CM | POA: Insufficient documentation

## 2018-04-14 LAB — CBC WITH DIFFERENTIAL/PLATELET
Basophils Absolute: 0 10*3/uL (ref 0.0–0.1)
Basophils Relative: 0 %
Eosinophils Absolute: 0.1 10*3/uL (ref 0.0–0.7)
Eosinophils Relative: 1 %
HEMATOCRIT: 36.6 % (ref 36.0–46.0)
HEMOGLOBIN: 12.3 g/dL (ref 12.0–15.0)
LYMPHS ABS: 2.7 10*3/uL (ref 0.7–4.0)
Lymphocytes Relative: 35 %
MCH: 28.1 pg (ref 26.0–34.0)
MCHC: 33.6 g/dL (ref 30.0–36.0)
MCV: 83.6 fL (ref 78.0–100.0)
MONOS PCT: 4 %
Monocytes Absolute: 0.3 10*3/uL (ref 0.1–1.0)
NEUTROS ABS: 4.6 10*3/uL (ref 1.7–7.7)
NEUTROS PCT: 60 %
Platelets: 326 10*3/uL (ref 150–400)
RBC: 4.38 MIL/uL (ref 3.87–5.11)
RDW: 13.6 % (ref 11.5–15.5)
WBC: 7.6 10*3/uL (ref 4.0–10.5)

## 2018-04-14 LAB — COMPREHENSIVE METABOLIC PANEL
ALBUMIN: 4.1 g/dL (ref 3.5–5.0)
ALT: 77 U/L — AB (ref 14–54)
ANION GAP: 12 (ref 5–15)
AST: 47 U/L — ABNORMAL HIGH (ref 15–41)
Alkaline Phosphatase: 129 U/L — ABNORMAL HIGH (ref 38–126)
BUN: 10 mg/dL (ref 6–20)
CO2: 19 mmol/L — AB (ref 22–32)
Calcium: 9 mg/dL (ref 8.9–10.3)
Chloride: 108 mmol/L (ref 101–111)
Creatinine, Ser: 0.83 mg/dL (ref 0.44–1.00)
GFR calc non Af Amer: >60 mL/min (ref >60)
GLUCOSE: 102 mg/dL — AB (ref 65–99)
Potassium: 3.4 mmol/L — ABNORMAL LOW (ref 3.5–5.1)
Sodium: 139 mmol/L (ref 135–145)
Total Bilirubin: 0.4 mg/dL (ref 0.3–1.2)
Total Protein: 7.5 g/dL (ref 6.5–8.1)

## 2018-04-14 LAB — INFLUENZA PANEL BY PCR (TYPE A & B)
Influenza A By PCR: NEGATIVE
Influenza B By PCR: NEGATIVE

## 2018-04-14 MED ORDER — GUAIFENESIN-DM 100-10 MG/5ML PO SYRP: 5.0000 mL | ORAL_SOLUTION | ORAL | 0 refills | Status: DC | PRN

## 2018-04-14 MED ORDER — DOXYCYCLINE HYCLATE 100 MG PO CAPS: 100.0000 mg | ORAL_CAPSULE | Freq: Two times a day (BID) | ORAL | 0 refills | Status: DC

## 2018-04-14 MED ORDER — OSELTAMIVIR PHOSPHATE 75 MG PO CAPS: 75.0000 mg | ORAL_CAPSULE | Freq: Two times a day (BID) | ORAL | 0 refills | Status: DC

## 2018-04-14 MED ORDER — ALBUTEROL SULFATE HFA 108 (90 BASE) MCG/ACT IN AERS: 1.0000 | INHALATION_SPRAY | Freq: Once | RESPIRATORY_TRACT | Status: DC

## 2018-04-14 MED ORDER — IPRATROPIUM-ALBUTEROL 0.5-2.5 (3) MG/3ML IN SOLN
3.0000 mL | Freq: Once | RESPIRATORY_TRACT | Status: AC
Start: 2018-04-14 — End: 2018-04-14
  Administered 2018-04-14: 3 mL via RESPIRATORY_TRACT
  Filled 2018-04-14: qty 3

## 2018-04-14 NOTE — Discharge Instructions (Addendum)
Please read attached information. If you experience any new or worsening signs or symptoms please return to the emergency room for evaluation. Please follow-up with your primary care provider or specialist as discussed. Please use medication prescribed only as directed and discontinue taking if you have any concerning signs or symptoms.   °
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
## 2018-04-14 NOTE — ED Triage Notes (Signed)
Pt to ER for evaluation of productive cough with yellow sputum x5 days. Reports feels very weak. Taking care of someone with pneumonia.

## 2018-04-18 ENCOUNTER — Other Ambulatory Visit: Payer: Self-pay | Admitting: Obstetrics and Gynecology

## 2018-04-18 DIAGNOSIS — Z1231 Encounter for screening mammogram for malignant neoplasm of breast: Secondary | ICD-10-CM

## 2018-04-23 ENCOUNTER — Emergency Department (HOSPITAL_COMMUNITY)
Admission: EM | Admit: 2018-04-23 | Discharge: 2018-04-23 | Disposition: A | Discharge status: Home / Self Care | Payer: Medicaid Other | Attending: Emergency Medicine | Admitting: Emergency Medicine

## 2018-04-23 ENCOUNTER — Emergency Department (HOSPITAL_COMMUNITY): Payer: Medicaid Other

## 2018-04-23 ENCOUNTER — Other Ambulatory Visit: Payer: Self-pay

## 2018-04-23 ENCOUNTER — Encounter (HOSPITAL_COMMUNITY): Payer: Self-pay | Admitting: Emergency Medicine

## 2018-04-23 DIAGNOSIS — R0602 Shortness of breath: Secondary | ICD-10-CM

## 2018-04-23 DIAGNOSIS — R079 Chest pain, unspecified: Secondary | ICD-10-CM | POA: Insufficient documentation

## 2018-04-23 DIAGNOSIS — J40 Bronchitis, not specified as acute or chronic: Secondary | ICD-10-CM

## 2018-04-23 DIAGNOSIS — Z79899 Other long term (current) drug therapy: Secondary | ICD-10-CM | POA: Insufficient documentation

## 2018-04-23 LAB — I-STAT TROPONIN, ED: Troponin i, poc: 0 ng/mL (ref 0.00–0.08)

## 2018-04-23 LAB — BASIC METABOLIC PANEL
Anion gap: 12 (ref 5–15)
BUN: 12 mg/dL (ref 6–20)
CHLORIDE: 106 mmol/L (ref 101–111)
CO2: 23 mmol/L (ref 22–32)
CREATININE: 0.88 mg/dL (ref 0.44–1.00)
Calcium: 9.2 mg/dL (ref 8.9–10.3)
GFR calc Af Amer: >60 mL/min (ref >60)
GFR calc non Af Amer: >60 mL/min (ref >60)
Glucose, Bld: 106 mg/dL — ABNORMAL HIGH (ref 65–99)
Potassium: 3.7 mmol/L (ref 3.5–5.1)
SODIUM: 141 mmol/L (ref 135–145)

## 2018-04-23 LAB — CBC
HCT: 36.6 % (ref 36.0–46.0)
Hemoglobin: 12.1 g/dL (ref 12.0–15.0)
MCH: 28.1 pg (ref 26.0–34.0)
MCHC: 33.1 g/dL (ref 30.0–36.0)
MCV: 84.9 fL (ref 78.0–100.0)
PLATELETS: 348 10*3/uL (ref 150–400)
RBC: 4.31 MIL/uL (ref 3.87–5.11)
RDW: 13.5 % (ref 11.5–15.5)
WBC: 5.1 10*3/uL (ref 4.0–10.5)

## 2018-04-23 LAB — I-STAT CG4 LACTIC ACID, ED: LACTIC ACID, VENOUS: 1.44 mmol/L (ref 0.5–1.9)

## 2018-04-23 MED ORDER — ONDANSETRON 4 MG PO TBDP
4.0000 mg | ORAL_TABLET | Freq: Once | ORAL | Status: AC
Start: 2018-04-23 — End: 2018-04-23
  Administered 2018-04-23: 4 mg via ORAL
  Filled 2018-04-23: qty 1

## 2018-04-23 MED ORDER — HYDROXYZINE HCL 25 MG PO TABS: 25.0000 mg | ORAL_TABLET | Freq: Four times a day (QID) | ORAL | 0 refills | Status: DC | PRN

## 2018-04-23 MED ORDER — IPRATROPIUM-ALBUTEROL 0.5-2.5 (3) MG/3ML IN SOLN
3.0000 mL | Freq: Once | RESPIRATORY_TRACT | Status: AC
  Administered 2018-04-23: 3 mL via RESPIRATORY_TRACT
  Filled 2018-04-23: qty 3

## 2018-04-23 MED ORDER — ACETAMINOPHEN 500 MG PO TABS
1000.0000 mg | ORAL_TABLET | Freq: Once | ORAL | Status: AC
Start: 2018-04-23 — End: 2018-04-23
  Administered 2018-04-23: 500 mg via ORAL
  Filled 2018-04-23: qty 2

## 2018-04-23 MED ORDER — PREDNISONE 50 MG PO TABS: 50.0000 mg | ORAL_TABLET | Freq: Every day | ORAL | 0 refills | Status: AC

## 2018-04-23 MED ORDER — GUAIFENESIN 100 MG/5ML PO LIQD: 200.0000 mg | ORAL | 0 refills | Status: DC | PRN

## 2018-04-23 MED ORDER — GUAIFENESIN 100 MG/5ML PO SOLN
10.0000 mL | Freq: Once | ORAL | Status: AC
  Administered 2018-04-23: 200 mg via ORAL
  Filled 2018-04-23: qty 10

## 2018-04-23 MED ORDER — PREDNISONE 20 MG PO TABS
60.0000 mg | ORAL_TABLET | Freq: Once | ORAL | Status: AC
  Administered 2018-04-23: 60 mg via ORAL
  Filled 2018-04-23: qty 3

## 2018-04-23 MED ORDER — ALBUTEROL SULFATE HFA 108 (90 BASE) MCG/ACT IN AERS
2.0000 | INHALATION_SPRAY | Freq: Once | RESPIRATORY_TRACT | Status: AC
  Administered 2018-04-23: 2 via RESPIRATORY_TRACT
  Filled 2018-04-23: qty 6.7
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
## 2018-04-23 NOTE — ED Triage Notes (Signed)
Pt states seen 4/22 for cough shortness of breath, diagnosed with pneumonia. Completed antibiotics. Pt states still has a cough but has had dizziness with chest tightness and nausea for 5 days.

## 2018-04-23 NOTE — Discharge Instructions (Addendum)
Your evaluation is very reassuring today, I think your symptoms are likely caused by postinfectious reactive bronchitis, chest x-ray looks clear, labs and EKG look good.  Please take 5-day course of steroids as directed, and like free to use your albuterol inhaler every 4-6 hours for the next day and then as needed.  You may use guaifenesin syrup for cough.  I have also refilled your home anxiety medication.  You will need to follow-up with the [facility].  Return to the ED for worsening chest pain or shortness of breath, persistent fevers, lightheadedness or syncope or any other new or concerning symptoms.

## 2018-04-23 NOTE — ED Notes (Signed)
ED Provider at bedside. 

## 2018-05-15 ENCOUNTER — Ambulatory Visit (HOSPITAL_COMMUNITY): Payer: Self-pay

## 2018-05-15 ENCOUNTER — Telehealth: Payer: Self-pay

## 2018-05-15 NOTE — Telephone Encounter (Signed)
Patient called for GI number to cancel her current appointment. Per 5/23 phone msg return

## 2018-06-12 ENCOUNTER — Encounter (HOSPITAL_COMMUNITY): Payer: Self-pay | Admitting: Family Medicine

## 2018-06-12 ENCOUNTER — Ambulatory Visit (INDEPENDENT_AMBULATORY_CARE_PROVIDER_SITE_OTHER): Payer: Medicaid Other

## 2018-06-12 ENCOUNTER — Ambulatory Visit (HOSPITAL_COMMUNITY)
Admission: EM | Admit: 2018-06-12 | Discharge: 2018-06-12 | Disposition: A | Discharge status: Home / Self Care | Payer: Medicaid Other | Attending: Family Medicine | Admitting: Family Medicine

## 2018-06-12 DIAGNOSIS — S63632A Sprain of interphalangeal joint of right middle finger, initial encounter: Secondary | ICD-10-CM | POA: Diagnosis not present

## 2018-06-12 DIAGNOSIS — M25512 Pain in left shoulder: Secondary | ICD-10-CM

## 2018-06-12 DIAGNOSIS — F5101 Primary insomnia: Secondary | ICD-10-CM

## 2018-06-12 DIAGNOSIS — G4709 Other insomnia: Secondary | ICD-10-CM | POA: Diagnosis not present

## 2018-06-12 DIAGNOSIS — R9389 Abnormal findings on diagnostic imaging of other specified body structures: Secondary | ICD-10-CM

## 2018-06-12 MED ORDER — ZOLPIDEM TARTRATE 5 MG PO TABS: 5.0000 mg | ORAL_TABLET | Freq: Every evening | ORAL | 0 refills | Status: DC | PRN

## 2018-06-12 NOTE — ED Triage Notes (Signed)
Pt c/o R middle finger pain, states she injured it "a while ago and I cant bend my finger as well". 

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
## 2018-06-12 NOTE — Discharge Instructions (Addendum)
You need to follow up with a primary care doctor for the abnormal ultrasound done last year  I am referring you to a neurologist for the shoulder pain and burning foot.    COMPARISON:  CT 05/29/2017.   FINDINGS: Hysterectomy   Right ovary   Measurements: 5.2 x 3.9 x 5.7 cm. 3.3 x 3.2 x 3.1 cm cyst with mild internal echoes and septations. Septations are slightly thickened. Cystic ovarian neoplasm cannot be excluded.   Left ovary   Not visualized   Other findings   No abnormal free fluid.   IMPRESSION: 3.3 x 3.2 x 3.1 cm complex cyst with septations. Septations are slightly thickened. Cystic ovarian neoplasm cannot be excluded. Finding correlate with recent abnormal CT. Gynecologic evaluation suggested .     Electronically Signed   By: Marcello Moores  Register   On: 06/20/2017 09:15   COMPARISON:  08/29/2015   FINDINGS: Lower chest: Bibasilar hypoventilatory changes.   Hepatobiliary: No focal liver abnormality is seen. Status post cholecystectomy. No biliary dilatation.   Pancreas: Unremarkable. No pancreatic ductal dilatation or surrounding inflammatory changes.   Spleen: Normal in size without focal abnormality.   Adrenals/Urinary Tract: Normal adrenal glands. 1.4 x 1.3 cm complex attenuation mass, partially exophytic off of the posterosuperior cortex of the right kidney. Tiny nonobstructive right renal calculus. Left renal cysts.   Stomach/Bowel: Stomach is within normal limits. No evidence of small bowel wall thickening, distention, or inflammatory changes. Focal asymmetric mucosal thickening of the distal descending colon with pericolonic mesenteric stranding.   Vascular/Lymphatic: No significant vascular findings are present. No enlarged abdominal or pelvic lymph nodes.   Reproductive: Post hysterectomy. No evidence of left adnexal mass. Complex right adnexal mass measuring 4.3 by 4.1 by 5.2 cm   Other: No abdominal wall hernia or abnormality. No  abdominopelvic ascites.   Musculoskeletal: No acute or significant osseous findings.   IMPRESSION: Short segment of asymmetric mucosal thickening of the distal descending colon with associated pericolonic inflammatory fat stranding. No significant diverticulosis. This may represent focal colitis, however given the short segment of involvement, malignancy should be considered. Correlation with colonoscopy is recommended.   Complex right adnexal mass measuring 5.2 cm. Further evaluation with MRI of the pelvis or ultrasound of the pelvis is recommended as malignancy cannot be excluded.   Stable from 2016 but still indeterminate 1.4 cm solid mass off of the superior pole of the right kidney. Low grade renal carcinoma versus AML are of consideration.   Tiny nonobstructive right renal calculus.     Electronically Signed   By: Fidela Salisbury M.D.   On: 06/08/2017 22:29

## 2018-06-17 ENCOUNTER — Ambulatory Visit (INDEPENDENT_AMBULATORY_CARE_PROVIDER_SITE_OTHER): Payer: Self-pay | Admitting: Orthopaedic Surgery

## 2018-06-22 ENCOUNTER — Encounter (HOSPITAL_COMMUNITY): Payer: Self-pay | Admitting: Emergency Medicine

## 2018-06-22 ENCOUNTER — Ambulatory Visit (HOSPITAL_COMMUNITY): Payer: Medicaid Other

## 2018-06-22 ENCOUNTER — Ambulatory Visit (INDEPENDENT_AMBULATORY_CARE_PROVIDER_SITE_OTHER): Payer: Medicaid Other

## 2018-06-22 ENCOUNTER — Ambulatory Visit (HOSPITAL_COMMUNITY)
Admission: EM | Admit: 2018-06-22 | Discharge: 2018-06-22 | Disposition: A | Discharge status: Home / Self Care | Payer: Medicaid Other | Attending: Family Medicine | Admitting: Family Medicine

## 2018-06-22 DIAGNOSIS — K5900 Constipation, unspecified: Secondary | ICD-10-CM

## 2018-06-22 DIAGNOSIS — R140 Abdominal distension (gaseous): Secondary | ICD-10-CM

## 2018-06-22 DIAGNOSIS — R0602 Shortness of breath: Secondary | ICD-10-CM

## 2018-06-22 DIAGNOSIS — R0789 Other chest pain: Secondary | ICD-10-CM

## 2018-06-22 MED ORDER — PSYLLIUM 0.52 G PO CAPS: 0.5200 g | ORAL_CAPSULE | Freq: Every day | ORAL | 0 refills | Status: DC

## 2018-06-22 MED ORDER — POLYETHYLENE GLYCOL 3350 17 GM/SCOOP PO POWD: 17.0000 g | Freq: Every day | ORAL | 0 refills | Status: DC

## 2018-06-22 NOTE — ED Triage Notes (Signed)
Pt c/o abdominal cramping, feeling bloated, with constipation x1 week, states she feels full of gas, feels like shes impacted.
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
## 2018-06-22 NOTE — Discharge Instructions (Addendum)
Please use miralax daily, 1 capful, please also take daily metamucil to increase fiber. Drink more water daily.  To help reduce constipation and promote bowel health: 1. Drink at least 64 ounces of water each day 2. Eat plenty of fiber (fruits, vegetables, whole grains, legumes) 3. Be physically active or exercise including walking, jogging, swimming, yoga, etc. 4. For active constipation use a stool softener (docusate) or an osmotic laxative (like Miralax) each day, or as needed.

## 2018-07-08 ENCOUNTER — Encounter (INDEPENDENT_AMBULATORY_CARE_PROVIDER_SITE_OTHER): Payer: Self-pay | Admitting: Orthopaedic Surgery

## 2018-07-08 ENCOUNTER — Ambulatory Visit (INDEPENDENT_AMBULATORY_CARE_PROVIDER_SITE_OTHER): Payer: Medicaid Other | Admitting: Orthopaedic Surgery

## 2018-07-08 ENCOUNTER — Ambulatory Visit (INDEPENDENT_AMBULATORY_CARE_PROVIDER_SITE_OTHER): Payer: Medicaid Other

## 2018-07-08 VITALS — Ht 63.0 in | Wt 180.0 lb

## 2018-07-08 DIAGNOSIS — G8929 Other chronic pain: Secondary | ICD-10-CM

## 2018-07-08 DIAGNOSIS — M25512 Pain in left shoulder: Secondary | ICD-10-CM

## 2018-07-08 MED ORDER — CYCLOBENZAPRINE HCL 5 MG PO TABS: 5.0000 mg | ORAL_TABLET | Freq: Three times a day (TID) | ORAL | 3 refills | Status: DC | PRN

## 2018-07-08 MED ORDER — DIAZEPAM 5 MG PO TABS
5.0000 mg | ORAL_TABLET | Freq: Once | ORAL | 0 refills | Status: AC
Start: 2018-07-08 — End: 2018-07-08
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
## 2018-07-22 ENCOUNTER — Ambulatory Visit
Admission: RE | Admit: 2018-07-22 | Discharge: 2018-07-22 | Disposition: A | Discharge status: Home / Self Care | Payer: Medicaid Other | Source: Ambulatory Visit | Attending: Orthopaedic Surgery | Admitting: Orthopaedic Surgery

## 2018-07-22 ENCOUNTER — Ambulatory Visit (INDEPENDENT_AMBULATORY_CARE_PROVIDER_SITE_OTHER): Payer: Self-pay | Admitting: Orthopaedic Surgery

## 2018-07-22 DIAGNOSIS — M25512 Pain in left shoulder: Principal | ICD-10-CM

## 2018-07-22 DIAGNOSIS — G8929 Other chronic pain: Secondary | ICD-10-CM

## 2018-07-25 ENCOUNTER — Ambulatory Visit (INDEPENDENT_AMBULATORY_CARE_PROVIDER_SITE_OTHER): Payer: Medicaid Other | Admitting: Physician Assistant

## 2018-07-25 ENCOUNTER — Encounter (INDEPENDENT_AMBULATORY_CARE_PROVIDER_SITE_OTHER): Payer: Self-pay | Admitting: Physician Assistant

## 2018-07-25 ENCOUNTER — Other Ambulatory Visit (INDEPENDENT_AMBULATORY_CARE_PROVIDER_SITE_OTHER): Payer: Self-pay

## 2018-07-25 DIAGNOSIS — M25512 Pain in left shoulder: Principal | ICD-10-CM

## 2018-07-25 DIAGNOSIS — G8929 Other chronic pain: Secondary | ICD-10-CM | POA: Diagnosis not present
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
## 2018-07-28 ENCOUNTER — Encounter: Payer: Self-pay | Admitting: Neurology

## 2018-08-13 ENCOUNTER — Ambulatory Visit (INDEPENDENT_AMBULATORY_CARE_PROVIDER_SITE_OTHER): Payer: Medicaid Other | Admitting: Orthopaedic Surgery

## 2018-08-13 ENCOUNTER — Encounter (INDEPENDENT_AMBULATORY_CARE_PROVIDER_SITE_OTHER): Payer: Self-pay | Admitting: Orthopaedic Surgery

## 2018-08-13 ENCOUNTER — Ambulatory Visit (INDEPENDENT_AMBULATORY_CARE_PROVIDER_SITE_OTHER): Payer: Medicaid Other

## 2018-08-13 VITALS — Ht 63.0 in | Wt 187.0 lb

## 2018-08-13 DIAGNOSIS — M79605 Pain in left leg: Secondary | ICD-10-CM

## 2018-08-13 DIAGNOSIS — M79604 Pain in right leg: Secondary | ICD-10-CM | POA: Diagnosis not present

## 2018-08-13 MED ORDER — DICLOFENAC SODIUM 1 % TD GEL: 2.0000 g | Freq: Four times a day (QID) | TRANSDERMAL | 2 refills | Status: DC
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
## 2018-08-24 ENCOUNTER — Ambulatory Visit (HOSPITAL_COMMUNITY)
Admission: EM | Admit: 2018-08-24 | Discharge: 2018-08-24 | Disposition: A | Discharge status: Home / Self Care | Payer: Medicaid Other

## 2018-08-24 ENCOUNTER — Inpatient Hospital Stay (HOSPITAL_COMMUNITY)
Admission: EM | Admit: 2018-08-24 | Discharge: 2018-08-24 | Disposition: A | Discharge status: Home / Self Care | Payer: Medicaid Other | Source: Ambulatory Visit | Attending: Obstetrics & Gynecology | Admitting: Obstetrics & Gynecology

## 2018-08-24 ENCOUNTER — Other Ambulatory Visit: Payer: Self-pay

## 2018-08-24 ENCOUNTER — Encounter (HOSPITAL_COMMUNITY): Payer: Self-pay

## 2018-08-24 DIAGNOSIS — R109 Unspecified abdominal pain: Secondary | ICD-10-CM | POA: Diagnosis not present

## 2018-08-24 DIAGNOSIS — M549 Dorsalgia, unspecified: Secondary | ICD-10-CM | POA: Diagnosis present

## 2018-08-24 DIAGNOSIS — Z90710 Acquired absence of both cervix and uterus: Secondary | ICD-10-CM | POA: Insufficient documentation

## 2018-08-24 DIAGNOSIS — R1030 Lower abdominal pain, unspecified: Secondary | ICD-10-CM

## 2018-08-24 LAB — URINALYSIS, ROUTINE W REFLEX MICROSCOPIC
BILIRUBIN URINE: NEGATIVE
Glucose, UA: NEGATIVE mg/dL
HGB URINE DIPSTICK: NEGATIVE
Ketones, ur: NEGATIVE mg/dL
Leukocytes, UA: NEGATIVE
NITRITE: NEGATIVE
Protein, ur: NEGATIVE mg/dL
Specific Gravity, Urine: 1.017 (ref 1.005–1.030)
pH: 6 (ref 5.0–8.0)

## 2018-08-24 MED ORDER — KETOROLAC TROMETHAMINE 60 MG/2ML IM SOLN
60.0000 mg | Freq: Once | INTRAMUSCULAR | Status: AC
  Administered 2018-08-24: 60 mg via INTRAMUSCULAR
  Filled 2018-08-24: qty 2

## 2018-08-24 MED ORDER — TRAMADOL HCL 50 MG PO TABS: 50.0000 mg | ORAL_TABLET | Freq: Four times a day (QID) | ORAL | 0 refills | Status: AC | PRN

## 2018-08-24 NOTE — ED Notes (Signed)
Spoke with patient, she stated she had ovarian cysts and was in a lot of pain in her back, pt offered evaluation by a provider and informed about our capabilities here. [facility]
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
## 2018-08-24 NOTE — MAU Note (Signed)
Pt. States lower left back pain started 3 weeks ago and left lower abd. Pain started more than a month ago, but has gotten worse today.  Pt. Denies vaginal bleeding or dc.

## 2018-08-24 NOTE — Discharge Instructions (Signed)

## 2018-08-24 NOTE — MAU Note (Addendum)
Pt refused to sign signature pad or have discharge vital signs taken. Pt ambulated off unit with husband.

## 2018-08-28 ENCOUNTER — Emergency Department (HOSPITAL_COMMUNITY): Payer: Medicaid Other

## 2018-08-28 ENCOUNTER — Emergency Department (HOSPITAL_COMMUNITY)
Admission: EM | Admit: 2018-08-28 | Discharge: 2018-08-29 | Disposition: A | Discharge status: Home / Self Care | Payer: Medicaid Other | Attending: Emergency Medicine | Admitting: Emergency Medicine

## 2018-08-28 ENCOUNTER — Encounter (HOSPITAL_COMMUNITY): Payer: Self-pay | Admitting: Emergency Medicine

## 2018-08-28 DIAGNOSIS — R198 Other specified symptoms and signs involving the digestive system and abdomen: Secondary | ICD-10-CM

## 2018-08-28 DIAGNOSIS — R0989 Other specified symptoms and signs involving the circulatory and respiratory systems: Secondary | ICD-10-CM | POA: Diagnosis present

## 2018-08-28 DIAGNOSIS — K224 Dyskinesia of esophagus: Secondary | ICD-10-CM | POA: Diagnosis not present

## 2018-08-28 DIAGNOSIS — K219 Gastro-esophageal reflux disease without esophagitis: Secondary | ICD-10-CM | POA: Insufficient documentation

## 2018-08-28 DIAGNOSIS — Z79899 Other long term (current) drug therapy: Secondary | ICD-10-CM | POA: Insufficient documentation

## 2018-08-28 MED ORDER — GI COCKTAIL ~~LOC~~
30.0000 mL | Freq: Once | ORAL | Status: AC
  Administered 2018-08-28: 30 mL via ORAL
  Filled 2018-08-28: qty 30
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
## 2018-08-28 NOTE — ED Triage Notes (Signed)
Patient arrive with EMS from home reports food stuck in her esophagus while eating supper this evening , airway intact/respirations unlabored / no emesis.

## 2018-08-29 ENCOUNTER — Encounter: Payer: Self-pay | Admitting: Gastroenterology

## 2018-08-29 MED ORDER — OMEPRAZOLE 20 MG PO CPDR: 20.0000 mg | DELAYED_RELEASE_CAPSULE | Freq: Every day | ORAL | 0 refills | Status: DC

## 2018-08-29 MED ORDER — RANITIDINE HCL 150 MG PO TABS: 150.0000 mg | ORAL_TABLET | Freq: Two times a day (BID) | ORAL | 0 refills | Status: DC

## 2018-08-29 NOTE — Discharge Instructions (Addendum)
1. Medications: zantac, omeprazole, usual home medications 2. Treatment: rest, drink plenty of fluids, advance diet slowly 3. Follow Up: Please followup with your GI in 2-3 days for discussion of your diagnoses and further evaluation after today's visit; if you do not have a primary care doctor use the resource guide provided to find one; Please return to the ER for persistent vomiting, high fevers or worsening symptoms

## 2018-09-04 HISTORY — PX: LAPAROTOMY: SHX154

## 2018-09-04 HISTORY — PX: DIAGNOSTIC LAPAROSCOPY: SUR761

## 2018-09-11 ENCOUNTER — Encounter

## 2018-09-11 ENCOUNTER — Ambulatory Visit: Payer: Medicaid Other | Admitting: Neurology

## 2018-09-11 ENCOUNTER — Ambulatory Visit: Payer: Medicaid Other | Admitting: Gastroenterology

## 2018-09-22 ENCOUNTER — Ambulatory Visit (INDEPENDENT_AMBULATORY_CARE_PROVIDER_SITE_OTHER): Payer: Medicaid Other | Admitting: Gastroenterology

## 2018-09-22 ENCOUNTER — Encounter: Payer: Self-pay | Admitting: Gastroenterology

## 2018-09-22 VITALS — BP 128/76 | HR 87 | Ht 63.0 in | Wt 189.1 lb

## 2018-09-22 DIAGNOSIS — R1310 Dysphagia, unspecified: Secondary | ICD-10-CM | POA: Diagnosis not present

## 2018-09-22 DIAGNOSIS — R12 Heartburn: Secondary | ICD-10-CM

## 2018-09-22 DIAGNOSIS — R740 Nonspecific elevation of levels of transaminase and lactic acid dehydrogenase [LDH]: Secondary | ICD-10-CM

## 2018-09-22 DIAGNOSIS — R7401 Elevation of levels of liver transaminase levels: Secondary | ICD-10-CM

## 2018-09-22 NOTE — Patient Instructions (Signed)
If you are age 60 or older, your body mass index should be between 23-30. Your Body mass index is 33.5 kg/m. If this is out of the aforementioned range listed, please consider follow up with your Primary Care Provider.  If you are age 77 or younger, your body mass index should be between 19-25. Your Body mass index is 33.5 kg/m. If this is out of the aformentioned range listed, please consider follow up with your Primary Care Provider.   You have been scheduled for an endoscopy. Please follow written instructions given to you at your visit today. If you use inhalers (even only as needed), please bring them with you on the day of your procedure. Your physician has requested that you go to www.startemmi.com and enter the access code given to you at your visit today. This web site gives a general overview about your procedure. However, you should still follow specific instructions given to you by our office regarding your preparation for the procedure.  You have been scheduled for a Barium Esophogram at [facility] (1st floor of the hospital) on 1018/2019 at 9:30am. Please arrive 15 minutes prior to your appointment for registration. Make certain not to have anything to eat or drink 3 hours prior to your test. If you need to reschedule for any reason, please contact radiology at [phone] to do so. __________________________________________________________________ A barium swallow is an examination that concentrates on views of the esophagus. This tends to be a double contrast exam (barium and two liquids which, when combined, create a gas to distend the wall of the oesophagus) or single contrast (non-ionic iodine based). The study is usually tailored to your symptoms so a good history is essential. Attention is paid during the study to the form, structure and configuration of the esophagus, looking for functional disorders (such as aspiration, dysphagia, achalasia, motility and  reflux) EXAMINATION You may be asked to change into a gown, depending on the type of swallow being performed. A radiologist and radiographer will perform the procedure. The radiologist will advise you of the type of contrast selected for your procedure and direct you during the exam. You will be asked to stand, sit or lie in several different positions and to hold a small amount of fluid in your mouth before being asked to swallow while the imaging is performed .In some instances you may be asked to swallow barium coated marshmallows to assess the motility of a solid food bolus. The exam can be recorded as a digital or video fluoroscopy procedure. POST PROCEDURE It will take 1-2 days for the barium to pass through your system. To facilitate this, it is important, unless otherwise directed, to increase your fluids for the next 24-48hrs and to resume your normal diet.  This test typically takes about 30 minutes to perform. ___________________________________________________________________ It was a pleasure to see you today!  Vito Cirigliano, D.O.
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
## 2018-10-08 ENCOUNTER — Telehealth: Payer: Self-pay | Admitting: Gastroenterology

## 2018-10-08 NOTE — Telephone Encounter (Signed)
Patient requesting a call from the nurse about rescheduling her barium swallow to a different time on 10.18.19.

## 2018-10-08 NOTE — Telephone Encounter (Signed)
Patient provided the number to central scheduling and advised they will help her reschedule.

## 2018-10-10 ENCOUNTER — Ambulatory Visit (HOSPITAL_COMMUNITY)
Admission: RE | Admit: 2018-10-10 | Discharge: 2018-10-10 | Disposition: A | Discharge status: Home / Self Care | Payer: Medicaid Other | Source: Ambulatory Visit | Attending: Gastroenterology | Admitting: Gastroenterology

## 2018-10-10 DIAGNOSIS — R12 Heartburn: Secondary | ICD-10-CM | POA: Insufficient documentation

## 2018-10-10 DIAGNOSIS — K449 Diaphragmatic hernia without obstruction or gangrene: Secondary | ICD-10-CM | POA: Diagnosis not present

## 2018-10-10 DIAGNOSIS — R1310 Dysphagia, unspecified: Secondary | ICD-10-CM

## 2018-10-10 DIAGNOSIS — K219 Gastro-esophageal reflux disease without esophagitis: Secondary | ICD-10-CM | POA: Diagnosis not present

## 2018-10-14 ENCOUNTER — Encounter: Payer: Self-pay | Admitting: *Deleted

## 2018-10-14 ENCOUNTER — Ambulatory Visit (INDEPENDENT_AMBULATORY_CARE_PROVIDER_SITE_OTHER): Payer: Self-pay | Admitting: Orthopaedic Surgery

## 2018-10-14 ENCOUNTER — Ambulatory Visit (AMBULATORY_SURGERY_CENTER): Payer: Medicaid Other | Admitting: Gastroenterology

## 2018-10-14 ENCOUNTER — Encounter: Payer: Self-pay | Admitting: Gastroenterology

## 2018-10-14 VITALS — BP 139/81 | HR 77 | Temp 97.5°F | Resp 22 | Ht 63.0 in | Wt 187.0 lb

## 2018-10-14 DIAGNOSIS — K219 Gastro-esophageal reflux disease without esophagitis: Secondary | ICD-10-CM | POA: Diagnosis not present

## 2018-10-14 DIAGNOSIS — R1310 Dysphagia, unspecified: Secondary | ICD-10-CM | POA: Diagnosis not present

## 2018-10-14 DIAGNOSIS — K449 Diaphragmatic hernia without obstruction or gangrene: Secondary | ICD-10-CM

## 2018-10-14 DIAGNOSIS — R1319 Other dysphagia: Secondary | ICD-10-CM

## 2018-10-14 DIAGNOSIS — K222 Esophageal obstruction: Secondary | ICD-10-CM | POA: Diagnosis not present

## 2018-10-14 MED ORDER — SUCRALFATE 1 GM/10ML PO SUSP: 1.0000 g | Freq: Four times a day (QID) | ORAL | 3 refills | Status: DC

## 2018-10-14 MED ORDER — SODIUM CHLORIDE 0.9 % IV SOLN: 500.0000 mL | Freq: Once | INTRAVENOUS | Status: DC

## 2018-10-14 NOTE — Progress Notes (Signed)
Report given to PACU, vss 

## 2018-10-14 NOTE — Progress Notes (Signed)
Pt's states no medical or surgical changes since previsit or office visit. 

## 2018-10-14 NOTE — Patient Instructions (Signed)
Thank you for allowing [place] to care for you today!  Soft Diet for today, then resume previous diet and activity tomorrow.  Use Sucralfate suspension 1 gram by mouth 4 times per day as needed for esophageal discomfort.  Resume high dose acid suppression therapy for now. (Continue Protonix twice per day.)  If interested in further treatments available to treat your acid reflux, schedule a follow up appointment with Dr Bryan Lemma.        YOU HAD AN ENDOSCOPIC PROCEDURE TODAY AT [facility]:   Refer to the procedure report that was given to you for any specific questions about what was found during the examination.  If the procedure report does not answer your questions, please call your gastroenterologist to clarify.  If you requested that your care partner not be given the details of your procedure findings, then the procedure report has been included in a sealed envelope for you to review at your convenience later.  YOU SHOULD EXPECT: Some feelings of bloating in the abdomen. Passage of more gas than usual.  Walking can help get rid of the air that was put into your GI tract during the procedure and reduce the bloating. If you had a lower endoscopy (such as a colonoscopy or flexible sigmoidoscopy) you may notice spotting of blood in your stool or on the toilet paper. If you underwent a bowel prep for your procedure, you may not have a normal bowel movement for a few days.  Please Note:  You might notice some irritation and congestion in your nose or some drainage.  This is from the oxygen used during your procedure.  There is no need for concern and it should clear up in a day or so.  SYMPTOMS TO REPORT IMMEDIATELY:    Following upper endoscopy (EGD)  Vomiting of blood or coffee ground material  New chest pain or pain under the shoulder blades  Painful or persistently difficult swallowing  New shortness of breath  Fever of 100F or higher  Black, tarry-looking  stools  For urgent or emergent issues, a gastroenterologist can be reached at any hour by calling [phone].   DIET:  We do recommend a small meal at first, but then you may proceed to your regular diet.  Drink plenty of fluids but you should avoid alcoholic beverages for 24 hours.  ACTIVITY:  You should plan to take it easy for the rest of today and you should NOT DRIVE or use heavy machinery until tomorrow (because of the sedation medicines used during the test).    FOLLOW UP: Our staff will call the number listed on your records the next business day following your procedure to check on you and address any questions or concerns that you may have regarding the information given to you following your procedure. If we do not reach you, we will leave a message.  However, if you are feeling well and you are not experiencing any problems, there is no need to return our call.  We will assume that you have returned to your regular daily activities without incident.  If any biopsies were taken you will be contacted by phone or by letter within the next 1-3 weeks.  Please call us at [phone] if you have not heard about the biopsies in 3 weeks.    SIGNATURES/CONFIDENTIALITY: You and/or your care partner have signed paperwork which will be entered into your electronic medical record.  These signatures attest to the fact that that the  information above on your After Visit Summary has been reviewed and is understood.  Full responsibility of the confidentiality of this discharge information lies with you and/or your care-partner.

## 2018-10-14 NOTE — Op Note (Signed)
Katherine Miller Patient Name: Katherine Miller Procedure Date: 10/14/2018 3:23 PM MRN: [id number] Endoscopist: Gerrit Heck , MD Age: 63 Referring MD:  Date of Birth: 1956-06-21 Gender: Female Account #: [id number] Procedure:                Upper GI endoscopy Indications:              Dysphagia, Heartburn, Suspected esophageal reflux,                            Abnormal UGI series                           62 yo female with history of reflux symptoms                            (heartburn and regurgitation) and solid food                            dysphagia. Recent UGI series notable for small                            hiatal hernia, moderate to extensive reflux, and a                            possibel stricture at the GE junction, with                            otherwise normal motility. Medicines:                Monitored Anesthesia Care Procedure:                Pre-Anesthesia Assessment:                           - Prior to the procedure, a History and Physical                            was performed, and patient medications and                            allergies were reviewed. The patient's tolerance of                            previous anesthesia was also reviewed. The risks                            and benefits of the procedure and the sedation                            options and risks were discussed with the patient.                            All questions were answered, and informed consent  was obtained. Prior Anticoagulants: The patient has                            taken no previous anticoagulant or antiplatelet                            agents. ASA Grade Assessment: II - A patient with                            mild systemic disease. After reviewing the risks                            and benefits, the patient was deemed in                            satisfactory condition to undergo the procedure.      After obtaining informed consent, the endoscope was                            passed under direct vision. Throughout the                            procedure, the patient's blood pressure, pulse, and                            oxygen saturations were monitored continuously. The                            Endoscope was introduced through the mouth, and                            advanced to the second part of duodenum. The upper                            GI endoscopy was accomplished without difficulty.                            The patient tolerated the procedure well. Scope In: Scope Out: Findings:                 Esophagogastric landmarks were identified: the                            Z-line was found at 34 cm, the gastroesophageal                            junction was found at 34 cm and the site of hiatal                            narrowing was found at 35 cm from the incisors.                           A 1 cm axial hiatal hernia was present on  anterograde views, but was 3 cm in transverse width                            on retroflexed views.                           The gastroesophageal flap valve was visualized                            endoscopically and classified as Hill Grade III                            (minimal fold, loose to endoscope, hiatal hernia                            likely).                           One benign-appearing, intrinsic mild stenosis was                            found 34 cm from the incisors. 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                            This stenosis measured 1 cm (in length). The                            stenosis was traversed. A TTS dilator was passed                            through the scope. Dilation with an 18-19-20 mm                            balloon dilator was performed to 20 mm. The                            dilation  site was examined and showed mild mucosal                            disruption consistent with successful balloon                            dilation. Estimated blood loss was minimal.                           The upper third of the esophagus and middle third                            of the esophagus were normal.  The entire examined stomach was otherwise normal.                           The duodenal bulb, first portion of the duodenum                            and second portion of the duodenum were normal. Complications:            No immediate complications. Estimated Blood Loss:     Estimated blood loss was minimal. Impression:               - Esophagogastric landmarks identified as above.                           - 1 cm axial hiatal hernia was present on                            anterograde views, but was 3 cm in transverse width                            on retroflexed views.                           - Gastroesophageal flap valve classified as Hill                            Grade III (minimal fold, loose to endoscope, hiatal                            hernia likely), with 3 cm transverse width hernia                            retroflexed views                           - Benign-appearing esophageal stenosis at 34 cm.                            Dilated with a 20 mm balloon with appropriate                            mucosal rent consistent with successful dilation.                           - Normal upper third of esophagus and middle third                            of esophagus.                           - Normal stomach.                           - Normal duodenal bulb, first portion of the  duodenum and second portion of the duodenum.                           - No specimens collected. Recommendation:           - Patient has a contact number available for                            emergencies. The signs and symptoms  of potential                            delayed complications were discussed with the                            patient. Return to normal activities tomorrow.                            Written discharge instructions were provided to the                            patient.                           - Soft diet today then resume previous diet as                            tolerated tomorrow.                           - Continue present medications.                           - Use sucralfate suspension 1 gram PO QID PRN for                            esophageal discomfort.                           - Resume high dose acid suppression therapy for now.                           - Return to GI clinic in 1 month.                           - Given the size of the hiatal hernia on today's                            exam, if you are interested in an antirelfux                            procedure, I recommend a combined approach with                            laparoscopic repair of the hernia along with  endoscopic treatment of your acid reflux with                            Transoral Incisionless Fundoplication (TIF). To                            discuss in further detail, please schedule a                            follow-up appointment with Dr. Bryan Lemma. Gerrit Heck, MD 10/14/2018 3:54:52 PM

## 2018-10-15 ENCOUNTER — Ambulatory Visit (INDEPENDENT_AMBULATORY_CARE_PROVIDER_SITE_OTHER): Payer: Medicaid Other

## 2018-10-15 ENCOUNTER — Telehealth: Payer: Self-pay

## 2018-10-15 ENCOUNTER — Encounter (INDEPENDENT_AMBULATORY_CARE_PROVIDER_SITE_OTHER): Payer: Self-pay | Admitting: Orthopaedic Surgery

## 2018-10-15 ENCOUNTER — Ambulatory Visit (INDEPENDENT_AMBULATORY_CARE_PROVIDER_SITE_OTHER): Payer: Medicaid Other | Admitting: Orthopaedic Surgery

## 2018-10-15 DIAGNOSIS — G90519 Complex regional pain syndrome I of unspecified upper limb: Secondary | ICD-10-CM | POA: Diagnosis not present

## 2018-10-15 DIAGNOSIS — M542 Cervicalgia: Secondary | ICD-10-CM

## 2018-10-15 MED ORDER — PREDNISONE 10 MG (21) PO TBPK: ORAL_TABLET | ORAL | 0 refills | Status: DC
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
## 2018-10-15 NOTE — Telephone Encounter (Signed)
  Follow up Call-  Call back number 10/14/2018  Post procedure Call Back phone  # [phone]  Permission to leave phone message Yes  Some recent data might be hidden     No answer

## 2018-10-15 NOTE — Telephone Encounter (Signed)
No answer, left message to call back later today, B.Schwartz RN. 

## 2018-10-22 NOTE — Telephone Encounter (Signed)
Opened window in error no notes to enter

## 2018-10-24 ENCOUNTER — Telehealth (INDEPENDENT_AMBULATORY_CARE_PROVIDER_SITE_OTHER): Payer: Self-pay | Admitting: Orthopaedic Surgery

## 2018-10-24 ENCOUNTER — Ambulatory Visit (INDEPENDENT_AMBULATORY_CARE_PROVIDER_SITE_OTHER): Payer: Medicaid Other

## 2018-10-24 ENCOUNTER — Ambulatory Visit (INDEPENDENT_AMBULATORY_CARE_PROVIDER_SITE_OTHER): Payer: Medicaid Other | Admitting: Physician Assistant

## 2018-10-24 ENCOUNTER — Encounter (INDEPENDENT_AMBULATORY_CARE_PROVIDER_SITE_OTHER): Payer: Self-pay | Admitting: Physician Assistant

## 2018-10-24 DIAGNOSIS — M542 Cervicalgia: Secondary | ICD-10-CM

## 2018-10-24 MED ORDER — METHOCARBAMOL 500 MG PO TABS: 500.0000 mg | ORAL_TABLET | Freq: Every evening | ORAL | 0 refills | Status: DC | PRN
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
## 2018-10-24 NOTE — Telephone Encounter (Signed)
Message sent in error

## 2018-10-29 ENCOUNTER — Ambulatory Visit
Admission: RE | Admit: 2018-10-29 | Discharge: 2018-10-29 | Disposition: A | Discharge status: Home / Self Care | Payer: Medicaid Other | Source: Ambulatory Visit | Attending: Physician Assistant | Admitting: Physician Assistant

## 2018-10-29 DIAGNOSIS — M542 Cervicalgia: Secondary | ICD-10-CM

## 2018-10-30 ENCOUNTER — Other Ambulatory Visit (INDEPENDENT_AMBULATORY_CARE_PROVIDER_SITE_OTHER): Payer: Self-pay

## 2018-10-30 DIAGNOSIS — M542 Cervicalgia: Secondary | ICD-10-CM

## 2018-10-30 NOTE — Progress Notes (Signed)
Can you call this patient and let her know that there  is no change in recent neck mri compared to mri from 2016, and that we will have her fu with neurologist once they are able to see her

## 2018-11-03 ENCOUNTER — Encounter: Payer: Self-pay | Admitting: Neurology

## 2018-11-03 ENCOUNTER — Ambulatory Visit: Payer: Medicaid Other | Admitting: Physical Therapy

## 2018-11-03 ENCOUNTER — Ambulatory Visit (INDEPENDENT_AMBULATORY_CARE_PROVIDER_SITE_OTHER): Payer: Medicaid Other | Admitting: Neurology

## 2018-11-03 VITALS — BP 138/91 | HR 75 | Ht 63.0 in | Wt 191.0 lb

## 2018-11-03 DIAGNOSIS — M542 Cervicalgia: Secondary | ICD-10-CM

## 2018-11-03 DIAGNOSIS — M7918 Myalgia, other site: Secondary | ICD-10-CM

## 2018-11-03 DIAGNOSIS — R29898 Other symptoms and signs involving the musculoskeletal system: Secondary | ICD-10-CM | POA: Diagnosis not present

## 2018-11-03 DIAGNOSIS — G8929 Other chronic pain: Secondary | ICD-10-CM

## 2018-11-03 DIAGNOSIS — R202 Paresthesia of skin: Secondary | ICD-10-CM | POA: Diagnosis not present

## 2018-11-03 MED ORDER — PREGABALIN 50 MG PO CAPS: 50.0000 mg | ORAL_CAPSULE | Freq: Two times a day (BID) | ORAL | 6 refills | Status: DC

## 2018-11-03 NOTE — Patient Instructions (Addendum)
EMG/NCS: : bilateral upper extremities to assess for nerve damage from the neck, shoulder or in the arm/hand  Tenderness to palpation of all the cervical muscles and decreased ROM in the neck, likely myofascial cervical pain also causing cervico-occipito pain(occipital nerve pinching in the back of the heaad).  Physical therapy for above for myofascial pain and cervical muscle pain also causing decreased ROM in the neck and in the left shoulder she needs PT to reduce pain, increase range of motion of the neck and the left shoulder to include TENS, heating, manual therapy/massage, stretching, strengthening,. .  Dry Needling cervical muscles for cervical myofascial pain syndrome  Start Lyrica  Pregabalin capsules What is this medicine? PREGABALIN (pre GAB a lin) is used to treat nerve pain from diabetes, shingles, spinal cord injury, and fibromyalgia. It is also used to control seizures in epilepsy. This medicine may be used for other purposes; ask your health care provider or pharmacist if you have questions. COMMON BRAND NAME(S): Lyrica What should I tell my health care provider before I take this medicine? They need to know if you have any of these conditions: -bleeding problems -heart disease, including heart failure -history of alcohol or drug abuse -kidney disease -suicidal thoughts, plans, or attempt; a previous suicide attempt by you or a family member -an unusual or allergic reaction to pregabalin, gabapentin, other medicines, foods, dyes, or preservatives -pregnant or trying to get pregnant or trying to conceive with your partner -breast-feeding How should I use this medicine? Take this medicine by mouth with a glass of water. Follow the directions on the prescription label. You can take this medicine with or without food. Take your doses at regular intervals. Do not take your medicine more often than directed. Do not stop taking except on your doctor's advice. A special MedGuide  will be given to you by the pharmacist with each prescription and refill. Be sure to read this information carefully each time. Talk to your pediatrician regarding the use of this medicine in children. Special care may be needed. Overdosage: If you think you have taken too much of this medicine contact a poison control center or emergency room at once. NOTE: This medicine is only for you. Do not share this medicine with others. What if I miss a dose? If you miss a dose, take it as soon as you can. If it is almost time for your next dose, take only that dose. Do not take double or extra doses. What may interact with this medicine? -alcohol -certain medicines for blood pressure like captopril, enalapril, or lisinopril -certain medicines for diabetes, like pioglitazone or rosiglitazone -certain medicines for anxiety or sleep -narcotic medicines for pain This list may not describe all possible interactions. Give your health care provider a list of all the medicines, herbs, non-prescription drugs, or dietary supplements you use. Also tell them if you smoke, drink alcohol, or use illegal drugs. Some items may interact with your medicine. What should I watch for while using this medicine? Tell your doctor or healthcare professional if your symptoms do not start to get better or if they get worse. Visit your doctor or health care professional for regular checks on your progress. Do not stop taking except on your doctor's advice. You may develop a severe reaction. Your doctor will tell you how much medicine to take. Wear a medical identification bracelet or chain if you are taking this medicine for seizures, and carry a card that describes your disease and details of your  medicine and dosage times. You may get drowsy or dizzy. Do not drive, use machinery, or do anything that needs mental alertness until you know how this medicine affects you. Do not stand or sit up quickly, especially if you are an older  patient. This reduces the risk of dizzy or fainting spells. Alcohol may interfere with the effect of this medicine. Avoid alcoholic drinks. If you have a heart condition, like congestive heart failure, and notice that you are retaining water and have swelling in your hands or feet, contact your health care provider immediately. The use of this medicine may increase the chance of suicidal thoughts or actions. Pay special attention to how you are responding while on this medicine. Any worsening of mood, or thoughts of suicide or dying should be reported to your health care professional right away. This medicine has caused reduced sperm counts in some men. This may interfere with the ability to father a child. You should talk to your doctor or health care professional if you are concerned about your fertility. Women who become pregnant while using this medicine for seizures may enroll in the [place] Pregnancy Registry by calling [phone]. This registry collects information about the safety of antiepileptic drug use during pregnancy. What side effects may I notice from receiving this medicine? Side effects that you should report to your doctor or health care professional as soon as possible: -allergic reactions like skin rash, itching or hives, swelling of the face, lips, or tongue -breathing problems -changes in vision -chest pain -confusion -jerking or unusual movements of any part of your body -loss of memory -muscle pain, tenderness, or weakness -suicidal thoughts or other mood changes -swelling of the ankles, feet, hands -unusual bruising or bleeding Side effects that usually do not require medical attention (report to your doctor or health care professional if they continue or are bothersome): -dizziness -drowsiness -dry mouth -headache -nausea -tremors -trouble sleeping -weight gain This list may not describe all possible side effects. Call your doctor  for medical advice about side effects. You may report side effects to FDA at 1-800-FDA-1088. Where should I keep my medicine? Keep out of the reach of children. This medicine can be abused. Keep your medicine in a safe place to protect it from theft. Do not share this medicine with anyone. Selling or giving away this medicine is dangerous and against the law. This medicine may cause accidental overdose and death if it taken by other adults, children, or pets. Mix any unused medicine with a substance like cat litter or coffee grounds. Then throw the medicine away in a sealed container like a sealed bag or a coffee can with a lid. Do not use the medicine after the expiration date. Store at room temperature between 15 and 30 degrees C (59 and 86 degrees F). NOTE: This sheet is a summary. It may not cover all possible information. If you have questions about this medicine, talk to your doctor, pharmacist, or health care provider.  2018 Elsevier/Gold Standard (2016-01-12 10:26:12)   Electromyoneurogram Electromyoneurogram is a test to check how well your muscles and nerves are working. This procedure includes the combined use of electromyogram (EMG) and nerve conduction study (NCS). EMG is used to look for muscular disorders. NCS, which is also called electroneurogram, measures how well your nerves are controlling your muscles. The procedures are usually performed together to check if your muscles and nerves are healthy. If the reaction to testing is abnormal, this can indicate disease  or injury, such as peripheral nerve damage. Tell a health care provider about:  Any allergies you have.  All medicines you are taking, including vitamins, herbs, eye drops, creams, and over-the-counter medicines.  Any problems you or family members have had with anesthetic medicines.  Any blood disorders you have.  Any surgeries you have had.  Any medical conditions you have.  Any pacemaker you have. What are  the risks? Generally, this is a safe procedure. However, problems may occur, including:  Infection where the electrodes were inserted.  Bleeding.  What happens before the procedure?  Ask your health care provider about: ? Changing or stopping your regular medicines. This is especially important if you are taking diabetes medicines or blood thinners. ? Taking medicines such as aspirin and ibuprofen. These medicines can thin your blood. Do not take these medicines before your procedure if your health care provider instructs you not to.  Your health care provider may ask you to avoid: ? Caffeine, such as coffee and tea. ? Nicotine. This includes cigarettes and anything with tobacco.  Do not use lotions or creams on the same day that you will be having the procedure. What happens during the procedure? For EMG:  Your health care provider will ask you to stay in a position so that he or she can access the muscle that will be studied. You may be standing, sitting down, or lying down.  You may be given a medicine that numbs the area (local anesthetic).  A very thin needle that has an electrode on it will be inserted into your muscle.  Another small electrode will be placed on your skin near the muscle.  Your health care provider will ask you to continue to remain still.  The electrodes will send a signal that tells about the electrical activity of your muscles. You may see this on a monitor or hear it in the room.  After your muscles have been studied at rest, your health care provider will ask you to contract or flex your muscles. The electrodes will send a signal that tells about the electrical activity of your muscles.  Your health care provider will remove the electrodes and the electrode needles when the procedure is finished. The procedure may vary among health care providers and hospitals. For NCS:  An electrode that records your nerve activity (recording electrode) will be  placed on your skin by the muscle that is being studied.  An electrode that is used as a reference (reference electrode) will be placed near the recording electrode.  A paste or gel will be applied to your skin between the recording electrode and the reference electrode.  Your nerve will be stimulated with a mild shock. Your health care provider will measure how much time it takes for your muscle to react.  Your health care provider will remove the electrodes and the gel when the procedure is finished. The procedure may vary among health care providers and hospitals. What happens after the procedure?  It is your responsibility to obtain your test results. Ask your health care provider or the department performing the test when and how you will get your results.  Your health care provider may: ? Give you medicines for any pain. ? Monitor the insertion sites to make sure that they stop bleeding. This information is not intended to replace advice given to you by your health care provider. Make sure you discuss any questions you have with your health care provider. Document Released: 04/12/2005  Document Revised: 05/17/2016 Document Reviewed: 01/31/2015 Elsevier Interactive Patient Education  2018 [employer].  Occipital Neuralgia Occipital neuralgia is a type of headache that causes episodes of very bad pain in the back of your head. Pain from occipital neuralgia may spread (radiate) to other parts of your head. The pain is usually brief and often goes away after you rest and relax. These headaches may be caused by irritation of the nerves that leave your spinal cord high up in your neck, just below the base of your skull (occipital nerves). Your occipital nerves transmit sensations from the back of your head, the top of your head, and the areas behind your ears. What are the causes? Occipital neuralgia can occur without any known cause (primary headache syndrome). In other cases, occipital  neuralgia is caused by pressure on or irritation of one of the two occipital nerves. Causes of occipital nerve compression or irritation include:  Wear and tear of the vertebrae in the neck (osteoarthritis).  Neck injury.  Disease of the disks that separate the vertebrae.  Tumors.  Gout.  Infections.  Diabetes.  Swollen blood vessels that put pressure on the occipital nerves.  Muscle spasm in the neck.  What are the signs or symptoms? Pain is the main symptom of occipital neuralgia. It usually starts in the back of the head but may also be felt in other areas supplied by the occipital nerves. Pain is usually on one side but may be on both sides. You may have:  Brief episodes of very bad pain that is burning, stabbing, shocking, or shooting.  Pain behind the eye.  Pain triggered by neck movement or hair brushing.  Scalp tenderness.  Aching in the back of the head between episodes of very bad pain.  How is this diagnosed? Your health care provider may diagnose occipital neuralgia based on your symptoms and a physical exam. During the exam, the health care provider may push on areas supplied by the occipital nerves to see if they are painful. Some tests may also be done to help in making the diagnosis. These may include:  Imaging studies of the upper spinal cord, such as an MRI or CT scan. These may show compression or spinal cord abnormalities.  Nerve block. You will get an injection of numbing medicine (local anesthetic) near the occipital nerve to see if this relieves pain.  How is this treated? Treatment may begin with simple measures, such as:  Rest.  Massage.  Heat.  Over-the-counter pain relievers.  If these measures do not work, you may need other treatments, including:  Medicines such as: ? Prescription-strength anti-inflammatory medicines. ? Muscle relaxants. ? Antiseizure medicines. ? Antidepressants.  Steroid injection. This involves injections of  local anesthetic and strong anti-inflammatory drugs (steroids).  Pulsed radiofrequency. Wires are implanted to deliver electrical impulses that block pain signals from the occipital nerve.  Physical therapy.  Surgery to relieve nerve pressure.  Follow these instructions at home:  Take all medicines as directed by your health care provider.  Avoid activities that cause pain.  Rest when you have an attack of pain.  Try gentle massage or a heating pad to relieve pain.  Work with a physical therapist to learn stretching exercises you can do at home.  Try a different pillow or sleeping position.  Practice good posture.  Try to stay active. Get regular exercise that does not cause pain. Ask your health care provider to suggest safe exercises for you.  Keep all follow-up visits  as directed by your health care provider. This is important. Contact a health care provider if:  Your medicine is not working.  You have new or worsening symptoms. Get help right away if:  You have very bad head pain that is not going away.  You have a sudden change in vision, balance, or speech. This information is not intended to replace advice given to you by your health care provider. Make sure you discuss any questions you have with your health care provider. Document Released: 12/04/2001 Document Revised: 05/17/2016 Document Reviewed: 12/02/2013 Elsevier Interactive Patient Education  2017 [employer].
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
## 2018-11-04 ENCOUNTER — Encounter: Payer: Self-pay | Admitting: Neurology

## 2018-11-04 ENCOUNTER — Ambulatory Visit (INDEPENDENT_AMBULATORY_CARE_PROVIDER_SITE_OTHER): Payer: Self-pay | Admitting: Orthopaedic Surgery

## 2018-11-04 DIAGNOSIS — R202 Paresthesia of skin: Secondary | ICD-10-CM | POA: Insufficient documentation

## 2018-11-04 DIAGNOSIS — M7918 Myalgia, other site: Secondary | ICD-10-CM | POA: Insufficient documentation

## 2018-11-06 ENCOUNTER — Telehealth: Payer: Self-pay | Admitting: *Deleted

## 2018-11-06 ENCOUNTER — Other Ambulatory Visit: Payer: Self-pay

## 2018-11-06 ENCOUNTER — Encounter: Payer: Self-pay | Admitting: Physical Therapy

## 2018-11-06 ENCOUNTER — Ambulatory Visit: Payer: Medicaid Other | Attending: Neurology | Admitting: Physical Therapy

## 2018-11-06 DIAGNOSIS — M79602 Pain in left arm: Secondary | ICD-10-CM | POA: Diagnosis present

## 2018-11-06 DIAGNOSIS — M7918 Myalgia, other site: Secondary | ICD-10-CM | POA: Diagnosis present

## 2018-11-06 NOTE — Telephone Encounter (Signed)
PA faxed to [facility] at [phone]. Received fax confirmation. Waiting on determination.

## 2018-11-06 NOTE — Telephone Encounter (Signed)
Initaited PA pregabalin. Printed PA form off [employer]. In process of completing.
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
## 2018-11-11 ENCOUNTER — Encounter (INDEPENDENT_AMBULATORY_CARE_PROVIDER_SITE_OTHER): Payer: Self-pay | Admitting: Orthopaedic Surgery

## 2018-11-11 ENCOUNTER — Ambulatory Visit (INDEPENDENT_AMBULATORY_CARE_PROVIDER_SITE_OTHER): Payer: Medicaid Other | Admitting: Orthopaedic Surgery

## 2018-11-11 DIAGNOSIS — G894 Chronic pain syndrome: Secondary | ICD-10-CM

## 2018-11-11 DIAGNOSIS — M7918 Myalgia, other site: Secondary | ICD-10-CM
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
## 2018-11-12 NOTE — Telephone Encounter (Signed)
PA approved effective 11/06/18-11/01/19. PA# [id number].  Faxed notice of approval to [place] at [phone]. Received fax confirmation.

## 2018-11-18 ENCOUNTER — Ambulatory Visit: Payer: Medicaid Other | Admitting: Physical Therapy

## 2018-11-18 ENCOUNTER — Ambulatory Visit: Payer: Medicaid Other | Admitting: Gastroenterology

## 2018-11-18 ENCOUNTER — Encounter: Payer: Self-pay | Admitting: Gastroenterology

## 2018-11-18 ENCOUNTER — Encounter: Payer: Self-pay | Admitting: Physical Therapy

## 2018-11-18 VITALS — BP 144/82 | HR 88 | Ht 63.0 in | Wt 194.6 lb

## 2018-11-18 DIAGNOSIS — K449 Diaphragmatic hernia without obstruction or gangrene: Secondary | ICD-10-CM | POA: Diagnosis not present

## 2018-11-18 DIAGNOSIS — K760 Fatty (change of) liver, not elsewhere classified: Secondary | ICD-10-CM

## 2018-11-18 DIAGNOSIS — M7918 Myalgia, other site: Secondary | ICD-10-CM | POA: Diagnosis not present

## 2018-11-18 DIAGNOSIS — K219 Gastro-esophageal reflux disease without esophagitis: Secondary | ICD-10-CM | POA: Diagnosis not present

## 2018-11-18 DIAGNOSIS — R1310 Dysphagia, unspecified: Secondary | ICD-10-CM

## 2018-11-18 DIAGNOSIS — K581 Irritable bowel syndrome with constipation: Secondary | ICD-10-CM

## 2018-11-18 DIAGNOSIS — M79602 Pain in left arm: Secondary | ICD-10-CM

## 2018-11-18 MED ORDER — OMEPRAZOLE 20 MG PO CPDR: 20.0000 mg | DELAYED_RELEASE_CAPSULE | Freq: Every day | ORAL | 3 refills | Status: DC

## 2018-11-18 MED ORDER — LINACLOTIDE 145 MCG PO CAPS: 145.0000 ug | ORAL_CAPSULE | Freq: Every day | ORAL | 3 refills | Status: DC
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
## 2018-11-18 NOTE — Telephone Encounter (Signed)
Attempted to call patient regarding missing this mornings appointment.  Home phone fast busy signal.  Cell phone not set up to take a message.

## 2018-11-18 NOTE — Progress Notes (Signed)
P  Chief Complaint:    Dysphagia, GERD  GI History: 71o-year-old female initially seen by me on 09/22/2018 for intermittent solid food dysphagia and recent food impaction on 08/28/2018.  Food impaction cleared while in ER without need for endoscopic intervention.  She describes a longer history of being a "slow eater "and last to finish meals, but that was the first food impaction.  She additionally has a pre-existing history of GERD described as heartburn and regurgitation, which occurs sporadically and was not taking any acid suppression therapy at that time.  Barium esophagram on 10/10/2018 notable for small hiatal hernia with moderate to extensive reflux, barium tablet passing into stomach without delay and normal motility.  She was evaluated with EGD on 10/14/2018 which was notable for 1 cm axial height and 3 cm transverse with hiatal hernia, Hill grade 3 valve, stenosis at 34 cm consistent with benign peptic stricture, dilated with 20 mm balloon with appropriate mucosal disruption, otherwise normal upper and middle esophagus, normal stomach, normal duodenum.  Separately, recent CMP notable for AST/ALT 44/77 with normal T bili and ALP of 129.  She has had mildly elevated ALT>AST since at least 2010, with peak ALT 101 in 12/2009, but generally remain in the 60s.  Last abdominal imaging was CT abdomen/pelvis in 05/2017 and notable for normal liver, pancreas, spleen; status post ccy.  Plan at last appointment was for modest weight loss through diet and exercise with repeat liver enzymes in 6 months, and if still elevated despite modifications, can consider further serologic evaluation.  Previously seen by [facility] in 02/2010 for GERD, constipation, fatty liver, hematochezia with EGD and colonoscopy completed at that time.  No c/o dysphagia at that time. Full reports not available, but images from EGD previously reviewed and notable for some LES laxity on retroflexed views with otherwise normal-appearing Z  line.  Mild, non-H. pylori gastritis and normal duodenal biopsies noted on path report today. On colonoscopy, IC valve biopsied and benign, but otherwise no polyps noted on path report that was available for review today.  HPI:     Patient is a 62 y.o. female presenting to the Gastroenterology Clinic for follow-up.  She was initially seen by me on 09/22/2018 for intermittent solid food dysphagia.  She was evaluated with a barium esophagram which demonstrated normal motility, small hiatal hernia, moderate to extensive reflux.  Subsequent EGD on 10/14/2018 demonstrated a 1 cm axial height but 3 cm transverse width hiatal hernia with a Hill grade 3 valve, stenosis at 34 cm secondary to benign peptic stricture which was successfully dilated with 20 mm TTS balloon.  Today she states her dysphagia has completely resolved since EGD with dilation. Has had breakthrough reflux sxs since running out of medications the last few days. Was controlled with Prilosec 20 mg daily and Zantac which she stopped due to the recent medication recall.   States she has had recent recurrence of constipation over the last 1 to 2 months. Was previously treated with Linzess for IBS-C with good effect and requesting refill of this medication.  Otherwise, no hematochezia or melena.  Endoscopic history: -EGD (10/14/2018, Dr. Bryan Lemma): 1 cm axial height and 3 cm transverse with hiatal hernia, Hill grade 3 valve, stenosis at 34 cm consistent with benign peptic stricture, dilated with 20 mm balloon with appropriate mucosal disruption, otherwise normal upper and middle esophagus, normal stomach, normal duodenum.  Review of systems:     No chest pain, no SOB, no fevers, no urinary  sx   Past Medical History:  Diagnosis Date  . Allergy   . Anemia   . Anxiety   . Bronchitis   . History of prediabetes   . Ovarian mass, right 06/27/2017  . Right renal mass 12/24/2009    Patient's surgical history, family medical history, social  history, medications and allergies were all reviewed in Epic    Current Outpatient Medications  Medication Sig Dispense Refill  . cyclobenzaprine (FLEXERIL) 5 MG tablet Take 1-2 tablets (5-10 mg total) by mouth 3 (three) times daily as needed for muscle spasms. 30 tablet 3  . diclofenac sodium (VOLTAREN) 1 % GEL Apply 2 g topically 4 (four) times daily. (Patient taking differently: Apply 2 g topically as needed. ) 1 Tube 2  . zolpidem (AMBIEN) 5 MG tablet Take 1 tablet (5 mg total) by mouth at bedtime as needed for sleep. 60 tablet 0   No current facility-administered medications for this visit.     Physical Exam:     BP (!) 144/82   Pulse 88   Ht 5\' 3"  (1.6 m)   Wt 194 lb 9.6 oz (88.3 kg)   BMI 34.47 kg/m   GENERAL:  Pleasant female in NAD PSYCH: : Cooperative, normal affect EENT:  conjunctiva pink, mucous membranes moist, neck supple without masses ABDOMEN:  Nondistended, soft, nontender. No obvious masses, no hepatomegaly,  normal bowel sounds SKIN:  turgor, no lesions seen NEURO: Alert and oriented x 3, no focal neurologic deficits   IMPRESSION and PLAN:    #1. GERD: Reflux symptoms generally well controlled with daily Prilosec along with Zantac.  Stopped the latter due to recent medication recall.  Objective evidence of reflux to include recent barium esophagram with moderate to severe reflux noted along with peptic stricture on recent EGD.  Did have a long discussion regarding ongoing medical management of reflux versus antireflux surgery along with hiatal hernia repair.  She would like to seek surgical intervention with a goal of stopping or significantly reducing acid suppression therapy as she is fearful of medication side effects.  Will proceed as below:  - Refill Prilosec 20 mg daily today.  If symptoms not completely controlled with monotherapy, can either advance to twice daily dosing versus add H2 RA back (i.e. Pepcid) -We will send referral to [facility] (Dr. Excell Seltzer) for evaluation of hiatal hernia repair and either surgical partial fundoplication versus concomitant TIF.  Not a sole TIF candidate due to 3 cm transverse with hernia and Hill grade 3 valve, and therefore require surgical hernia repair. -Resume antireflux lifestyle measures  #2.  Dysphagia: Recent EGD with benign peptic stricture, successfully dilated with 20 mm TTS balloon with complete resolution of dysphagia symptoms.  #3.  IBS-C: Previously diagnosed IBS-C with clinical improvement with Linzess. -Provided with Rx to restart Linzess - Continue adequate fluid intake along with high-fiber diet/fiber supplement  #4.  Fatty liver disease: Mildly elevated ALT>AST since at least 2010.  Previously recommended modest weight loss over weeks to months through dietary modification/exercise. -Repeat liver enzymes in 6 months.  If still elevated despite appropriate modifications, can consider further serologic evaluation for concomitant liver disease.  RTC after surgical evaluation  I spent a total of 25 minutes of face-to-face time with the patient. Greater than 50% of the time was spent counseling and coordinating care.         [place] ,DO, FACG 11/18/2018, 11:40 AM

## 2018-11-18 NOTE — Patient Instructions (Addendum)
If you are age 60 or older, your body mass index should be between 23-30. Your Body mass index is 34.47 kg/m. If this is out of the aforementioned range listed, please consider follow up with your Primary Care Provider.  If you are age 57 or younger, your body mass index should be between 19-25. Your Body mass index is 34.47 kg/m. If this is out of the aformentioned range listed, please consider follow up with your Primary Care Provider.   We have sent the following medications to your pharmacy for you to pick up at your convenience: Linzess 153mcg daily. Prilosec 20mg  daily.  You will receive a call from [facility] to schedule a Consultation for your Hiatal Hernia Repair.  Please call our office at [phone] to set up your 3-6 month follow up visit.  It was a pleasure to see you today!  Vito Cirigliano, D.O.

## 2018-11-19 ENCOUNTER — Other Ambulatory Visit: Payer: Self-pay

## 2018-11-19 DIAGNOSIS — K449 Diaphragmatic hernia without obstruction or gangrene: Secondary | ICD-10-CM

## 2018-11-19 DIAGNOSIS — K219 Gastro-esophageal reflux disease without esophagitis: Secondary | ICD-10-CM

## 2018-11-19 DIAGNOSIS — R1310 Dysphagia, unspecified: Secondary | ICD-10-CM

## 2018-11-19 NOTE — Progress Notes (Signed)
Fax was sent to [facility] to refer patient for Hiatal Hernia repair and possible TIF at [facility].

## 2018-11-24 ENCOUNTER — Telehealth (HOSPITAL_COMMUNITY): Payer: Self-pay | Admitting: Obstetrics and Gynecology

## 2018-11-24 NOTE — Telephone Encounter (Signed)
Spoke with patient regarding rescheduling screening mammogram.  She no longer qualifies for the BCCCP program as she has Disability  Medicaid.  Patient advised she will be able to call on her own and schedule.

## 2018-11-28 ENCOUNTER — Ambulatory Visit: Payer: Medicaid Other | Attending: Neurology | Admitting: Physical Therapy

## 2018-11-28 ENCOUNTER — Encounter: Payer: Self-pay | Admitting: Physical Therapy

## 2018-11-28 DIAGNOSIS — M7918 Myalgia, other site: Secondary | ICD-10-CM

## 2018-11-28 DIAGNOSIS — M79602 Pain in left arm: Secondary | ICD-10-CM | POA: Insufficient documentation
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
## 2018-12-03 ENCOUNTER — Ambulatory Visit: Payer: Medicaid Other | Admitting: Physical Therapy

## 2018-12-03 ENCOUNTER — Encounter: Payer: Self-pay | Admitting: Physical Therapy

## 2018-12-03 DIAGNOSIS — M79602 Pain in left arm: Secondary | ICD-10-CM

## 2018-12-03 DIAGNOSIS — M7918 Myalgia, other site: Secondary | ICD-10-CM | POA: Diagnosis not present
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
## 2018-12-04 ENCOUNTER — Encounter

## 2018-12-04 ENCOUNTER — Telehealth: Payer: Self-pay

## 2018-12-04 ENCOUNTER — Ambulatory Visit (INDEPENDENT_AMBULATORY_CARE_PROVIDER_SITE_OTHER): Payer: Medicaid Other | Admitting: Neurology

## 2018-12-04 DIAGNOSIS — G8194 Hemiplegia, unspecified affecting left nondominant side: Secondary | ICD-10-CM | POA: Diagnosis not present

## 2018-12-04 DIAGNOSIS — M79602 Pain in left arm: Secondary | ICD-10-CM

## 2018-12-04 DIAGNOSIS — R202 Paresthesia of skin: Secondary | ICD-10-CM

## 2018-12-04 DIAGNOSIS — R29898 Other symptoms and signs involving the musculoskeletal system: Secondary | ICD-10-CM

## 2018-12-04 DIAGNOSIS — Z0289 Encounter for other administrative examinations: Secondary | ICD-10-CM

## 2018-12-04 NOTE — Telephone Encounter (Signed)
Patient is scheduled at [facility] for 12/19/2018 at 9:15am.

## 2018-12-08 NOTE — Progress Notes (Signed)
Full Name: Katherine Miller Gender: Female MRN #: [id number] Date of Birth: 09/26/1956    Visit Date: 12/04/2018 10:44 Age: 62 Years 15 Months Old Examining Physician: Sarina Ill, MD  Referring Physician: Jaynee Eagles, MD  History: Left arm pain and weakness  Summary: EMG/NCS performed on the bilateral upper extremities    Conclusion: This is a normal study    Sarina Ill M.D.  [facility] [place], [place] [address] Tel: [phone] Fax: [phone]        [facility]    Nerve / Sites Muscle Latency Ref. Amplitude Ref. Rel Amp Segments Distance Velocity Ref. Area    ms ms mV mV %  cm m/s m/s mVms  R Median - APB     Wrist APB 2.5 ?4.4 11.1 ?4.0 100 Wrist - APB 7   35.4     Upper arm APB 6.3  10.7  96.2 Upper arm - Wrist 22 58 ?49 33.5  L Median - APB     Wrist APB 2.8 ?4.4 7.5 ?4.0 100 Wrist - APB 7   25.5     Upper arm APB 6.6  7.1  94 Upper arm - Wrist 22 58 ?49 24.6  R Ulnar - ADM     Wrist ADM 2.6 ?3.3 9.5 ?6.0 100 Wrist - ADM 7   26.0     B.Elbow ADM 6.4  8.7  92 B.Elbow - Wrist 21 54 ?49 24.3     A.Elbow ADM 8.3  8.8  101 A.Elbow - B.Elbow 10 53 ?49 25.2         A.Elbow - Wrist      L Ulnar - ADM     Wrist ADM 2.3 ?3.3 8.0 ?6.0 100 Wrist - ADM 7   23.8     B.Elbow ADM 6.0  7.5  93.8 B.Elbow - Wrist 21 58 ?49 24.6     A.Elbow ADM 7.7  6.8  90.2 A.Elbow - B.Elbow 10 58 ?49 22.4         A.Elbow - Wrist      R Radial - EIP     Forearm EIP 1.5 ?2.9 4.7 ?2.0 100 Forearm - EIP 4  ?49 21.8     Elbow EIP 4.5  4.6  98.6 Elbow - Forearm 15 50  23.5     Spiral Gr EIP 5.9  4.6  100 Spiral Gr - Elbow 8 59  23.6  L Radial - EIP     Forearm EIP 1.7 ?2.9 4.4 ?2.0 100 Forearm - EIP 4  ?49 25.3     Elbow EIP 4.7  4.1  92.4 Elbow - Forearm 15 49  22.6     Spiral Gr EIP 6.5  3.6  87.3 Spiral Gr - Elbow 9 52  19.2                 SNC    Nerve / Sites Rec. Site Peak Lat Ref.  Amp Ref. Segments Distance    ms ms V V  cm  R Radial - Anatomical snuff  box (Forearm)     Forearm Wrist 2.2 ?2.9 17 ?15 Forearm - Wrist 10  L Radial - Anatomical snuff box (Forearm)     Forearm Wrist 2.3 ?2.9 17 ?15 Forearm - Wrist 10  R Median - Orthodromic (Dig II, Mid palm)     Dig II Wrist 2.6 ?3.4 10 ?10 Dig II - Wrist 13  L Median - Orthodromic (Dig  II, Mid palm)     Dig II Wrist 2.6 ?3.4 11 ?10 Dig II - Wrist 13  R Ulnar - Orthodromic, (Dig V, Mid palm)     Dig V Wrist 2.2 ?3.1 8 ?5 Dig V - Wrist 11  L Ulnar - Orthodromic, (Dig V, Mid palm)     Dig V Wrist 2.4 ?3.1 11 ?5 Dig V - Wrist 11  R Lateral antebrachial cutaneous - Forearm (Elbow)     Elbow Forearm 2.4 ?3.0 8 ?8 Elbow - Forearm 12  L Lateral antebrachial cutaneous - Forearm (Elbow)     Elbow Forearm 2.3 ?3.0 10 ?8 Elbow - Forearm 12  R Medial antebrachial cutaneous - Forearm (Elbow)     Elbow Forearm 2.3 ?3.2 6 ?5 Elbow - Forearm 12  L Medial antebrachial cutaneous - Forearm (Elbow)     Elbow Forearm 2.5 ?3.2 7 ?5 Elbow - Forearm 12                         F  Wave    Nerve F Lat Ref.   ms ms  R Ulnar - ADM 25.9 ?32.0  L Ulnar - ADM 26.1 ?32.0         EMG full       EMG Summary Table    Spontaneous MUAP Recruitment  Muscle IA Fib PSW Fasc Other Amp Dur. Poly Pattern  L. Deltoid Normal None None None _______ Normal Normal Normal Normal  L. Triceps brachii Normal None None None _______ Normal Normal Normal Normal  L. Biceps brachii Normal None None None _______ Normal Normal Normal Normal  L. Brachioradialis Normal None None None _______ Normal Normal Normal Normal  L. Extensor indicis proprius Normal None None None _______ Normal Normal Normal Normal  L. First dorsal interosseous Normal None None None _______ Normal Normal Normal Normal  L. Opponens pollicis Normal None None None _______ Normal Normal Normal Normal  L. Cervical paraspinals (low) Normal None None None _______ Normal Normal Normal Normal

## 2018-12-08 NOTE — Progress Notes (Signed)
See procedure note.

## 2018-12-09 ENCOUNTER — Ambulatory Visit
Admission: EM | Admit: 2018-12-09 | Discharge: 2018-12-09 | Disposition: A | Discharge status: Home / Self Care | Payer: Medicaid Other | Attending: Physician Assistant | Admitting: Physician Assistant

## 2018-12-09 ENCOUNTER — Telehealth: Payer: Self-pay | Admitting: Gastroenterology

## 2018-12-09 DIAGNOSIS — R1084 Generalized abdominal pain: Secondary | ICD-10-CM | POA: Insufficient documentation

## 2018-12-09 HISTORY — DX: Gastro-esophageal reflux disease without esophagitis: K21.9

## 2018-12-09 HISTORY — DX: Unspecified abdominal hernia without obstruction or gangrene: K46.9

## 2018-12-09 MED ORDER — ONDANSETRON 4 MG PO TBDP
4.0000 mg | ORAL_TABLET | Freq: Once | ORAL | Status: AC
  Administered 2018-12-09: 4 mg via ORAL

## 2018-12-09 NOTE — Telephone Encounter (Signed)
Pt calling back states she is not sure if she needs to be seen in the office by Dr.Cirigliano or needs to go to emergency room. States she is having pain in umbilical area was told by Dr.Cirigliano she needs to see a Psychologist, sport and exercise.

## 2018-12-09 NOTE — Telephone Encounter (Signed)
Called and spoke with patient-patient reports she was seen at the urgent care and they told her she needs to follow up GI, patient admits she has not seen the surgeon because they cannot get her into the office until 12/19/18 (scheduled with [facility] on 12/19/18 at 9:15 am)-abdominal tenderness, back pain, stomach hurting around belly button "like a funny feeling", really bad nausea-only "threw up once and maybe had some diarrhea"-patient understands that she does need surgery for the large umbilical hernia but does not know if this is related to that or is it something that she needs to go to the emergency department; Patient was advised if her pain was serious enough to be seen in the ED to go and be seen, the MD would be sent a message concerning this issue and the office would contact her as MD recommendations are made;  Please advise on next step in plan of care

## 2018-12-09 NOTE — Telephone Encounter (Signed)
Patient was at Urgent Care for sharp stomach pain, is very tender and they want her seen asap

## 2018-12-09 NOTE — ED Triage Notes (Signed)
Pt c/o dizziness this am, a pain to navel area yesterday with nausea/diarrhea. Pt states doesn't have a PCP.
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
## 2018-12-09 NOTE — Discharge Instructions (Signed)
Given your history and exam, I would like more workup done. You can call your GI doctor to see if they can evaluate you today, if not, I would like you to go to the emergency department for further evaluation.

## 2018-12-09 NOTE — Telephone Encounter (Signed)
She was referred to the surgeon for repair of a hiatal hernia and not a umbilical hernia, which should not have anything to do with her current complaints.  Current symptoms could be consistent with gastroenteritis, but it is difficult to discern without seeing the patient.  I agree that if her pain is severe, then presenting to the ER for further evaluation is reasonable.  Otherwise, can also be seen by her PCM.

## 2018-12-10 ENCOUNTER — Encounter (HOSPITAL_COMMUNITY): Payer: Self-pay | Admitting: Emergency Medicine

## 2018-12-10 ENCOUNTER — Other Ambulatory Visit: Payer: Self-pay

## 2018-12-10 ENCOUNTER — Emergency Department (HOSPITAL_COMMUNITY)
Admission: EM | Admit: 2018-12-10 | Discharge: 2018-12-10 | Disposition: A | Discharge status: Home / Self Care | Payer: Medicaid Other | Attending: Emergency Medicine | Admitting: Emergency Medicine

## 2018-12-10 ENCOUNTER — Emergency Department (HOSPITAL_COMMUNITY): Payer: Medicaid Other

## 2018-12-10 ENCOUNTER — Telehealth: Payer: Self-pay | Admitting: Neurology

## 2018-12-10 DIAGNOSIS — Z79899 Other long term (current) drug therapy: Secondary | ICD-10-CM | POA: Insufficient documentation

## 2018-12-10 DIAGNOSIS — R109 Unspecified abdominal pain: Secondary | ICD-10-CM

## 2018-12-10 DIAGNOSIS — R112 Nausea with vomiting, unspecified: Secondary | ICD-10-CM | POA: Insufficient documentation

## 2018-12-10 DIAGNOSIS — R1033 Periumbilical pain: Secondary | ICD-10-CM | POA: Diagnosis not present

## 2018-12-10 DIAGNOSIS — R1012 Left upper quadrant pain: Secondary | ICD-10-CM | POA: Insufficient documentation

## 2018-12-10 LAB — URINALYSIS, ROUTINE W REFLEX MICROSCOPIC
Bilirubin Urine: NEGATIVE
Glucose, UA: NEGATIVE mg/dL
Hgb urine dipstick: NEGATIVE
Ketones, ur: NEGATIVE mg/dL
Leukocytes, UA: NEGATIVE
Nitrite: NEGATIVE
Protein, ur: NEGATIVE mg/dL
Specific Gravity, Urine: 1.027 (ref 1.005–1.030)
pH: 5 (ref 5.0–8.0)

## 2018-12-10 LAB — COMPREHENSIVE METABOLIC PANEL
ALT: 40 U/L (ref 0–44)
AST: 34 U/L (ref 15–41)
Albumin: 4 g/dL (ref 3.5–5.0)
Alkaline Phosphatase: 109 U/L (ref 38–126)
Anion gap: 9 (ref 5–15)
BUN: 11 mg/dL (ref 6–20)
CO2: 25 mmol/L (ref 22–32)
CREATININE: 0.76 mg/dL (ref 0.44–1.00)
Calcium: 8.9 mg/dL (ref 8.9–10.3)
Chloride: 109 mmol/L (ref 98–111)
GFR calc Af Amer: >60 mL/min (ref >60)
GFR calc non Af Amer: >60 mL/min (ref >60)
Glucose, Bld: 93 mg/dL (ref 70–99)
Potassium: 3.6 mmol/L (ref 3.5–5.1)
SODIUM: 143 mmol/L (ref 135–145)
Total Bilirubin: 0.4 mg/dL (ref 0.3–1.2)
Total Protein: 7 g/dL (ref 6.5–8.1)

## 2018-12-10 LAB — CBC WITH DIFFERENTIAL/PLATELET
Abs Immature Granulocytes: 0.01 10*3/uL (ref 0.00–0.07)
BASOS ABS: 0 10*3/uL (ref 0.0–0.1)
Basophils Relative: 0 %
EOS PCT: 3 %
Eosinophils Absolute: 0.2 10*3/uL (ref 0.0–0.5)
HCT: 37 % (ref 36.0–46.0)
Hemoglobin: 12.1 g/dL (ref 12.0–15.0)
Immature Granulocytes: 0 %
Lymphocytes Relative: 56 %
Lymphs Abs: 3.1 10*3/uL (ref 0.7–4.0)
MCH: 28.3 pg (ref 26.0–34.0)
MCHC: 32.7 g/dL (ref 30.0–36.0)
MCV: 86.4 fL (ref 80.0–100.0)
MONO ABS: 0.4 10*3/uL (ref 0.1–1.0)
Monocytes Relative: 7 %
Neutro Abs: 2 10*3/uL (ref 1.7–7.7)
Neutrophils Relative %: 34 %
Platelets: 327 10*3/uL (ref 150–400)
RBC: 4.28 MIL/uL (ref 3.87–5.11)
RDW: 13.5 % (ref 11.5–15.5)
WBC: 5.7 10*3/uL (ref 4.0–10.5)
nRBC: 0 % (ref 0.0–0.2)

## 2018-12-10 LAB — LIPASE, BLOOD: Lipase: 31 U/L (ref 11–51)

## 2018-12-10 MED ORDER — DICYCLOMINE HCL 10 MG PO CAPS
10.0000 mg | ORAL_CAPSULE | Freq: Once | ORAL | Status: AC
  Administered 2018-12-10: 10 mg via ORAL
  Filled 2018-12-10: qty 1

## 2018-12-10 MED ORDER — DICYCLOMINE HCL 10 MG PO CAPS: 10.0000 mg | ORAL_CAPSULE | Freq: Three times a day (TID) | ORAL | 0 refills | Status: DC | PRN

## 2018-12-10 MED ORDER — FENTANYL CITRATE (PF) 100 MCG/2ML IJ SOLN
50.0000 ug | Freq: Once | INTRAMUSCULAR | Status: AC
  Administered 2018-12-10: 50 ug via INTRAVENOUS
  Filled 2018-12-10: qty 2

## 2018-12-10 MED ORDER — IOHEXOL 300 MG/ML  SOLN
100.0000 mL | Freq: Once | INTRAMUSCULAR | Status: AC | PRN
  Administered 2018-12-10: 100 mL via INTRAVENOUS

## 2018-12-10 NOTE — Telephone Encounter (Signed)
Left message for patient to call back  

## 2018-12-10 NOTE — ED Notes (Signed)
Patient verbalizes understanding of discharge instructions. Opportunity for questioning and answers were provided. 

## 2018-12-10 NOTE — ED Notes (Signed)
Patient transported to CT 

## 2018-12-10 NOTE — Telephone Encounter (Signed)
Pt states she was told Dr Jaynee Eagles would be ordering a  MRI for her but she has not been contacted by anyone.  Pt is wanting the MRI to be ordered for her head and neck.  Please call

## 2018-12-10 NOTE — ED Triage Notes (Addendum)
Pt here with c/o pain in abdomen 7/10.  Pt was seen yesterday at [facility] for same.  Today pain has worsened with tenderness in all quadrants.  Pt also notes nausea along with dizziness but denies vomiting and diarrhea.
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
## 2018-12-11 NOTE — Procedures (Signed)
Full Name: Katherine Miller Gender: Female MRN #: [id number] Date of Birth: [birth year]    Visit Date: 12/04/2018 10:44 Age: 62 Years 14 Months Old Examining Physician: Sarina Ill, MD  Referring Physician: Jaynee Eagles, MD  History: Left arm pain and weakness  Summary: EMG/NCS performed on the bilateral upper extremities    Conclusion: This is a normal study    Sarina Ill M.D.  [facility] [place], [place] [address] Tel: [phone] Fax: [phone]        [facility]    Nerve / Sites Muscle Latency Ref. Amplitude Ref. Rel Amp Segments Distance Velocity Ref. Area    ms ms mV mV %  cm m/s m/s mVms  R Median - APB     Wrist APB 2.5 ?4.4 11.1 ?4.0 100 Wrist - APB 7   35.4     Upper arm APB 6.3  10.7  96.2 Upper arm - Wrist 22 58 ?49 33.5  L Median - APB     Wrist APB 2.8 ?4.4 7.5 ?4.0 100 Wrist - APB 7   25.5     Upper arm APB 6.6  7.1  94 Upper arm - Wrist 22 58 ?49 24.6  R Ulnar - ADM     Wrist ADM 2.6 ?3.3 9.5 ?6.0 100 Wrist - ADM 7   26.0     B.Elbow ADM 6.4  8.7  92 B.Elbow - Wrist 21 54 ?49 24.3     A.Elbow ADM 8.3  8.8  101 A.Elbow - B.Elbow 10 53 ?49 25.2         A.Elbow - Wrist      L Ulnar - ADM     Wrist ADM 2.3 ?3.3 8.0 ?6.0 100 Wrist - ADM 7   23.8     B.Elbow ADM 6.0  7.5  93.8 B.Elbow - Wrist 21 58 ?49 24.6     A.Elbow ADM 7.7  6.8  90.2 A.Elbow - B.Elbow 10 58 ?49 22.4         A.Elbow - Wrist      R Radial - EIP     Forearm EIP 1.5 ?2.9 4.7 ?2.0 100 Forearm - EIP 4  ?49 21.8     Elbow EIP 4.5  4.6  98.6 Elbow - Forearm 15 50  23.5     Spiral Gr EIP 5.9  4.6  100 Spiral Gr - Elbow 8 59  23.6  L Radial - EIP     Forearm EIP 1.7 ?2.9 4.4 ?2.0 100 Forearm - EIP 4  ?49 25.3     Elbow EIP 4.7  4.1  92.4 Elbow - Forearm 15 49  22.6     Spiral Gr EIP 6.5  3.6  87.3 Spiral Gr - Elbow 9 52  19.2                 SNC    Nerve / Sites Rec. Site Peak Lat Ref.  Amp Ref. Segments Distance    ms ms V V  cm  R Radial - Anatomical snuff  box (Forearm)     Forearm Wrist 2.2 ?2.9 17 ?15 Forearm - Wrist 10  L Radial - Anatomical snuff box (Forearm)     Forearm Wrist 2.3 ?2.9 17 ?15 Forearm - Wrist 10  R Median - Orthodromic (Dig II, Mid palm)     Dig II Wrist 2.6 ?3.4 10 ?10 Dig II - Wrist 13  L Median - Orthodromic (Dig  II, Mid palm)     Dig II Wrist 2.6 ?3.4 11 ?10 Dig II - Wrist 13  R Ulnar - Orthodromic, (Dig V, Mid palm)     Dig V Wrist 2.2 ?3.1 8 ?5 Dig V - Wrist 11  L Ulnar - Orthodromic, (Dig V, Mid palm)     Dig V Wrist 2.4 ?3.1 11 ?5 Dig V - Wrist 11  R Lateral antebrachial cutaneous - Forearm (Elbow)     Elbow Forearm 2.4 ?3.0 8 ?8 Elbow - Forearm 12  L Lateral antebrachial cutaneous - Forearm (Elbow)     Elbow Forearm 2.3 ?3.0 10 ?8 Elbow - Forearm 12  R Medial antebrachial cutaneous - Forearm (Elbow)     Elbow Forearm 2.3 ?3.2 6 ?5 Elbow - Forearm 12  L Medial antebrachial cutaneous - Forearm (Elbow)     Elbow Forearm 2.5 ?3.2 7 ?5 Elbow - Forearm 12                         F  Wave    Nerve F Lat Ref.   ms ms  R Ulnar - ADM 25.9 ?32.0  L Ulnar - ADM 26.1 ?32.0         EMG full       EMG Summary Table    Spontaneous MUAP Recruitment  Muscle IA Fib PSW Fasc Other Amp Dur. Poly Pattern  L. Deltoid Normal None None None _______ Normal Normal Normal Normal  L. Triceps brachii Normal None None None _______ Normal Normal Normal Normal  L. Biceps brachii Normal None None None _______ Normal Normal Normal Normal  L. Brachioradialis Normal None None None _______ Normal Normal Normal Normal  L. Extensor indicis proprius Normal None None None _______ Normal Normal Normal Normal  L. First dorsal interosseous Normal None None None _______ Normal Normal Normal Normal  L. Opponens pollicis Normal None None None _______ Normal Normal Normal Normal  L. Cervical paraspinals (low) Normal None None None _______ Normal Normal Normal Normal

## 2018-12-11 NOTE — Telephone Encounter (Signed)
Called pt & LVM asking for call back. Left office number & hours in message. When pt calls back, please give her Dr. Cathren Miller message below. MRI brain has been ordered. She already had an MRI of her cervical spine (neck) in November.

## 2018-12-11 NOTE — Telephone Encounter (Signed)
She already had an MRI of her neck recently. I am ordering an MRI head. I think her MRI cervical was 10/2018. thanks

## 2018-12-12 ENCOUNTER — Telehealth: Payer: Self-pay | Admitting: Neurology

## 2018-12-12 NOTE — Telephone Encounter (Signed)
medicaid order sent to GI. They obtain the auth and will reach out to the pt to schedule.  °

## 2018-12-12 NOTE — Telephone Encounter (Signed)
Pt returning RNs call- advised of what was noted.

## 2018-12-12 NOTE — Telephone Encounter (Signed)
lvm for pt to be aware I left GI phone number of [phone] and to give them a call if she has not heard from them in the next 2-3 business days.  °

## 2018-12-12 NOTE — Telephone Encounter (Signed)
Noted  

## 2018-12-12 NOTE — Telephone Encounter (Signed)
Tried to call pt once more and LVM asking for call back. Looks like referrals had reached out to pt & LVM regarding MRI brain. Left office number for call back.

## 2018-12-16 ENCOUNTER — Encounter: Payer: Self-pay | Admitting: Physical Therapy

## 2018-12-16 ENCOUNTER — Ambulatory Visit: Payer: Medicaid Other | Admitting: Physical Therapy

## 2018-12-16 DIAGNOSIS — M7918 Myalgia, other site: Secondary | ICD-10-CM

## 2018-12-16 DIAGNOSIS — M79602 Pain in left arm: Secondary | ICD-10-CM

## 2018-12-16 NOTE — Telephone Encounter (Signed)
Patient made aware of MD recommendations and will call office back if questions/concerns arise; patient is waiting to be seem by surgeon for referral;
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
## 2018-12-18 ENCOUNTER — Ambulatory Visit: Payer: Medicaid Other | Admitting: Physical Therapy

## 2018-12-19 ENCOUNTER — Encounter: Payer: Medicaid Other | Admitting: Physical Therapy

## 2018-12-19 ENCOUNTER — Encounter: Payer: Self-pay | Admitting: Physical Therapy

## 2018-12-19 ENCOUNTER — Encounter

## 2018-12-19 ENCOUNTER — Ambulatory Visit: Payer: Medicaid Other | Admitting: Physical Therapy

## 2018-12-19 DIAGNOSIS — M7918 Myalgia, other site: Secondary | ICD-10-CM

## 2018-12-19 DIAGNOSIS — M79602 Pain in left arm: Secondary | ICD-10-CM
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
## 2018-12-22 ENCOUNTER — Ambulatory Visit: Payer: Medicaid Other | Admitting: Physical Therapy

## 2018-12-22 ENCOUNTER — Encounter: Payer: Self-pay | Admitting: Physical Therapy

## 2018-12-22 DIAGNOSIS — M7918 Myalgia, other site: Secondary | ICD-10-CM | POA: Diagnosis not present

## 2018-12-22 DIAGNOSIS — M79602 Pain in left arm: Secondary | ICD-10-CM

## 2018-12-22 NOTE — Telephone Encounter (Signed)
Medicaid Josem Kaufmann: [id number] (exp. 12/15/18 to 01/14/19) patient is scheduled at GI for 12/23/18.
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
## 2018-12-23 ENCOUNTER — Ambulatory Visit
Admission: RE | Admit: 2018-12-23 | Discharge: 2018-12-23 | Disposition: A | Discharge status: Home / Self Care | Payer: Medicaid Other | Source: Ambulatory Visit | Attending: Neurology | Admitting: Neurology

## 2018-12-23 DIAGNOSIS — G8194 Hemiplegia, unspecified affecting left nondominant side: Secondary | ICD-10-CM | POA: Diagnosis not present

## 2018-12-23 DIAGNOSIS — M79602 Pain in left arm: Secondary | ICD-10-CM

## 2018-12-23 DIAGNOSIS — R202 Paresthesia of skin: Secondary | ICD-10-CM

## 2018-12-23 DIAGNOSIS — R29898 Other symptoms and signs involving the musculoskeletal system: Secondary | ICD-10-CM

## 2018-12-23 MED ORDER — GADOBENATE DIMEGLUMINE 529 MG/ML IV SOLN
18.0000 mL | Freq: Once | INTRAVENOUS | Status: AC | PRN
  Administered 2018-12-23: 18 mL via INTRAVENOUS

## 2018-12-25 ENCOUNTER — Telehealth: Payer: Self-pay | Admitting: Neurology

## 2018-12-25 NOTE — Telephone Encounter (Signed)
Called pt and discussed her MRI results. Informed pt that her MRI of the brain is unremarkable for age. Her questions were answered. Also discussed that her EMG and NCS were normal. The MRI was done to rule out lesions, strokes other causes for her symptoms. Informed pt that her MRI did not show any stroke or lesions. Pt was very appreciative and had no further questions. She was encouraged to call back if needed.   ---------------------------- Notes recorded by Melvenia Beam, MD on 12/25/2018 at 11:03 AM EST MRI of the brain is unremarkable for age thanks

## 2018-12-25 NOTE — Telephone Encounter (Signed)
Pt requesting a call stating she has a few follow up questions regarding her NCV/EMG she had done.

## 2019-01-31 ENCOUNTER — Emergency Department (HOSPITAL_COMMUNITY)
Admission: EM | Admit: 2019-01-31 | Discharge: 2019-01-31 | Disposition: A | Discharge status: Home / Self Care | Payer: Medicaid Other | Attending: Emergency Medicine | Admitting: Emergency Medicine

## 2019-01-31 ENCOUNTER — Encounter (HOSPITAL_COMMUNITY): Payer: Self-pay

## 2019-01-31 DIAGNOSIS — M542 Cervicalgia: Secondary | ICD-10-CM | POA: Diagnosis not present

## 2019-01-31 MED ORDER — LIDOCAINE 5 % EX PTCH
1.0000 | MEDICATED_PATCH | CUTANEOUS | Status: DC
  Administered 2019-01-31: 1 via TRANSDERMAL
  Filled 2019-01-31: qty 1

## 2019-01-31 MED ORDER — LIDOCAINE 5 % EX PTCH: 1.0000 | MEDICATED_PATCH | CUTANEOUS | 0 refills | Status: DC

## 2019-01-31 MED ORDER — DICLOFENAC SODIUM 1 % TD GEL: 4.0000 g | Freq: Four times a day (QID) | TRANSDERMAL | 0 refills | Status: DC
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
## 2019-01-31 NOTE — Discharge Instructions (Signed)
Try Lidocaine patch every 12 hours Use Voltaren gel for pain You can try heating pad for stiff/sore muscles Follow up with your doctor

## 2019-01-31 NOTE — ED Triage Notes (Signed)
Onset 1 week right sided neck pain.  Pt was walking when she noticed the pain.  Pain worsens with movement.  Pt has not taken any pain med.

## 2019-03-09 ENCOUNTER — Encounter (HOSPITAL_COMMUNITY): Payer: Self-pay

## 2019-03-09 ENCOUNTER — Other Ambulatory Visit: Payer: Self-pay

## 2019-03-09 ENCOUNTER — Emergency Department (HOSPITAL_COMMUNITY)
Admission: EM | Admit: 2019-03-09 | Discharge: 2019-03-09 | Disposition: A | Discharge status: Home / Self Care | Payer: Medicaid Other | Attending: Emergency Medicine | Admitting: Emergency Medicine

## 2019-03-09 ENCOUNTER — Emergency Department (HOSPITAL_COMMUNITY)
Admission: EM | Admit: 2019-03-09 | Discharge: 2019-03-10 | Disposition: A | Discharge status: Home / Self Care | Payer: Medicaid Other | Source: Home / Self Care | Attending: Emergency Medicine | Admitting: Emergency Medicine

## 2019-03-09 ENCOUNTER — Emergency Department (HOSPITAL_COMMUNITY): Payer: Medicaid Other

## 2019-03-09 DIAGNOSIS — N839 Noninflammatory disorder of ovary, fallopian tube and broad ligament, unspecified: Secondary | ICD-10-CM

## 2019-03-09 DIAGNOSIS — R112 Nausea with vomiting, unspecified: Secondary | ICD-10-CM | POA: Insufficient documentation

## 2019-03-09 DIAGNOSIS — Z79899 Other long term (current) drug therapy: Secondary | ICD-10-CM

## 2019-03-09 DIAGNOSIS — R102 Pelvic and perineal pain: Secondary | ICD-10-CM | POA: Insufficient documentation

## 2019-03-09 DIAGNOSIS — R1031 Right lower quadrant pain: Secondary | ICD-10-CM | POA: Insufficient documentation

## 2019-03-09 DIAGNOSIS — N838 Other noninflammatory disorders of ovary, fallopian tube and broad ligament: Secondary | ICD-10-CM

## 2019-03-09 LAB — COMPREHENSIVE METABOLIC PANEL
ALT: 57 U/L — ABNORMAL HIGH (ref 0–44)
AST: 37 U/L (ref 15–41)
Albumin: 4.2 g/dL (ref 3.5–5.0)
Alkaline Phosphatase: 123 U/L (ref 38–126)
Anion gap: 9 (ref 5–15)
BUN: 12 mg/dL (ref 6–20)
CO2: 23 mmol/L (ref 22–32)
Calcium: 9.1 mg/dL (ref 8.9–10.3)
Chloride: 108 mmol/L (ref 98–111)
Creatinine, Ser: 0.87 mg/dL (ref 0.44–1.00)
GFR calc non Af Amer: >60 mL/min (ref >60)
Glucose, Bld: 134 mg/dL — ABNORMAL HIGH (ref 70–99)
Potassium: 3.7 mmol/L (ref 3.5–5.1)
Sodium: 140 mmol/L (ref 135–145)
Total Bilirubin: 0.3 mg/dL (ref 0.3–1.2)
Total Protein: 7.6 g/dL (ref 6.5–8.1)

## 2019-03-09 LAB — CBC WITH DIFFERENTIAL/PLATELET
Abs Immature Granulocytes: 0.02 10*3/uL (ref 0.00–0.07)
Basophils Absolute: 0 10*3/uL (ref 0.0–0.1)
Basophils Relative: 0 %
Eosinophils Absolute: 0.1 10*3/uL (ref 0.0–0.5)
Eosinophils Relative: 1 %
HCT: 40.7 % (ref 36.0–46.0)
Hemoglobin: 12.8 g/dL (ref 12.0–15.0)
Immature Granulocytes: 0 %
Lymphocytes Relative: 33 %
Lymphs Abs: 2.7 10*3/uL (ref 0.7–4.0)
MCH: 27.2 pg (ref 26.0–34.0)
MCHC: 31.4 g/dL (ref 30.0–36.0)
MCV: 86.6 fL (ref 80.0–100.0)
Monocytes Absolute: 0.6 10*3/uL (ref 0.1–1.0)
Monocytes Relative: 7 %
Neutro Abs: 4.7 10*3/uL (ref 1.7–7.7)
Neutrophils Relative %: 59 %
Platelets: 378 10*3/uL (ref 150–400)
RBC: 4.7 MIL/uL (ref 3.87–5.11)
RDW: 13.2 % (ref 11.5–15.5)
WBC: 8 10*3/uL (ref 4.0–10.5)
nRBC: 0 % (ref 0.0–0.2)

## 2019-03-09 LAB — URINALYSIS, ROUTINE W REFLEX MICROSCOPIC
Bacteria, UA: NONE SEEN
Bilirubin Urine: NEGATIVE
Bilirubin Urine: NEGATIVE
GLUCOSE, UA: NEGATIVE mg/dL
Glucose, UA: NEGATIVE mg/dL
Ketones, ur: NEGATIVE mg/dL
Ketones, ur: NEGATIVE mg/dL
Leukocytes,Ua: NEGATIVE
Nitrite: NEGATIVE
Nitrite: NEGATIVE
Protein, ur: 100 mg/dL — AB
Protein, ur: NEGATIVE mg/dL
RBC / HPF: >50 RBC/hpf — ABNORMAL HIGH (ref 0–5)
SPECIFIC GRAVITY, URINE: 1.027 (ref 1.005–1.030)
Specific Gravity, Urine: 1.005 (ref 1.005–1.030)
pH: 5 (ref 5.0–8.0)
pH: 6 (ref 5.0–8.0)

## 2019-03-09 LAB — LIPASE, BLOOD: LIPASE: 17 U/L (ref 11–51)

## 2019-03-09 MED ORDER — ONDANSETRON HCL 4 MG PO TABS: 4.0000 mg | ORAL_TABLET | Freq: Four times a day (QID) | ORAL | 0 refills | Status: DC | PRN

## 2019-03-09 MED ORDER — ONDANSETRON HCL 4 MG/2ML IJ SOLN
4.0000 mg | Freq: Once | INTRAMUSCULAR | Status: AC
  Administered 2019-03-09: 4 mg via INTRAVENOUS
  Filled 2019-03-09: qty 2

## 2019-03-09 MED ORDER — HYDROMORPHONE HCL 1 MG/ML IJ SOLN
0.5000 mg | Freq: Once | INTRAMUSCULAR | Status: AC
  Administered 2019-03-09: 0.5 mg via INTRAVENOUS
  Filled 2019-03-09: qty 1

## 2019-03-09 MED ORDER — SODIUM CHLORIDE 0.9 % IV BOLUS
500.0000 mL | Freq: Once | INTRAVENOUS | Status: AC
  Administered 2019-03-09: 500 mL via INTRAVENOUS

## 2019-03-09 MED ORDER — IOHEXOL 300 MG/ML  SOLN
100.0000 mL | Freq: Once | INTRAMUSCULAR | Status: AC | PRN
  Administered 2019-03-09: 100 mL via INTRAVENOUS

## 2019-03-09 MED ORDER — HYDROCODONE-ACETAMINOPHEN 5-325 MG PO TABS: 1.0000 | ORAL_TABLET | Freq: Four times a day (QID) | ORAL | 0 refills | Status: DC | PRN

## 2019-03-09 NOTE — ED Triage Notes (Signed)
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 She also states that she has a mid-abdominal hernia. She's had Rt-lower back pain & nausea for the last two days & began vomiting this morning.

## 2019-03-09 NOTE — ED Notes (Signed)
Patient transported to CT 

## 2019-03-09 NOTE — ED Triage Notes (Signed)
Pt seen here this AM for same. Scheduled for surgery tomorrow, resection of pelvic mass. Reports she was having lower abd pain that has now transitioned into vaginal discomfort.

## 2019-03-09 NOTE — ED Provider Notes (Signed)
[facility] Provider Note   CSN: [id number] Arrival date & time: 03/09/19  0735    History   Chief Complaint Chief Complaint  Patient presents with   Nausea   Emesis    Katherine Miller is a 63 y.o. female.     Katherine With a known left adnexal mass scheduled to have exploratory laparotomy and excision tomorrow by [place] presents with worsening right lower quadrant pain that radiates to her right flank for the last 2 days.  States she has been out of her pain medication.  This is been associated with nausea and several episodes of vomiting.  States she has had episodes of lightheadedness and felt like she was going to pass out.  She states she is not had a bowel movement or pass gas today.  Denies dysuria, hematuria, frequency or urgency.  No fever or chills.  Has not contacted her OB/GYN. Past Medical History:  Diagnosis Date   Allergy    Anemia    Anxiety    Bronchitis    GERD (gastroesophageal reflux disease)    Hernia, abdominal    History of prediabetes    Ovarian mass, right 06/27/2017   Right renal mass 12/24/2009    Patient Active Problem List   Diagnosis Date Noted   Myofascial pain syndrome, cervical 11/04/2018   Arm paresthesia, left 11/04/2018   Pain in both lower extremities 08/13/2018   Chronic left shoulder pain 07/25/2018   Ovarian mass, right 06/27/2017   Hyperglycemia 12/07/2016   Chronic pain syndrome 07/12/2015   Rotator cuff syndrome of left shoulder 04/26/2015   Neck pain 01/12/2014   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 Done at [facility]     OB History    Gravida  4   Para      Term      Preterm      AB  1   Living  3     SAB  1   TAB      Ectopic      Multiple      Live Births               Home Medications    Prior to Admission medications   Medication Sig Start Date End Date Taking? Authorizing Provider  cyclobenzaprine (FLEXERIL) 5 MG tablet Take 1-2 tablets (5-10 mg total) by mouth 3 (three) times daily as needed for muscle spasms. 07/08/18   Leandrew Koyanagi, MD  diclofenac sodium (VOLTAREN) 1 % GEL Apply 2 g topically 4 (four) times daily. Patient taking differently: Apply 2 g topically as needed.  08/13/18   Aundra Dubin, PA-C  diclofenac sodium (VOLTAREN) 1 % GEL Apply 4 g topically 4 (four) times daily. 01/31/19   Recardo Evangelist, PA-C  dicyclomine (BENTYL) 10 MG capsule Take 1 capsule (10 mg  total) by mouth 3 (three) times daily as needed for spasms. 12/10/18   Davonna Belling, MD  HYDROcodone-acetaminophen (NORCO) 5-325 MG tablet Take 1 tablet by mouth every 6 (six) hours as needed for severe pain. 03/09/19   Julianne Rice, MD  lidocaine (LIDODERM) 5 % Place 1 patch onto the skin daily. Remove & Discard patch within 12 hours or as directed by MD 01/31/19   Recardo Evangelist, PA-C  linaclotide [facility]) 145 MCG CAPS capsule Take 1 capsule (145 mcg total) by mouth daily before breakfast. 11/18/18   Cirigliano, Vito V, DO  omeprazole (PRILOSEC) 20 MG capsule Take 1 capsule (20 mg total) by mouth daily. 11/18/18   Cirigliano, Vito V, DO  ondansetron (ZOFRAN) 4 MG tablet Take 1 tablet (4 mg total) by mouth every 6 (six) hours as needed for nausea or vomiting. 03/09/19   Julianne Rice, MD    Family History Family History  Problem Relation Age of Onset   Depression Mother    Heart disease Father        AMI age 11  yo   Stroke Father        several ministrokes--cause of death   Cancer Brother        unknown type   Breast cancer Neg Hx    Colon cancer Neg Hx    Esophageal cancer Neg Hx     Social History Social History   Tobacco Use   Smoking status: Never Smoker   Smokeless tobacco: Never Used  Substance Use Topics   Alcohol use: No   Drug use: No     Allergies   Morphine   Review of Systems Review of Systems  Constitutional: Negative for chills and fever.  HENT: Negative for trouble swallowing.   Eyes: Negative for visual disturbance.  Respiratory: Negative for cough and shortness of breath.   Cardiovascular: Negative for chest pain.  Gastrointestinal: Positive for abdominal pain, constipation, nausea and vomiting.  Genitourinary: Positive for flank pain and pelvic pain. Negative for dysuria, frequency, hematuria and vaginal bleeding.  Musculoskeletal: Positive for back pain and myalgias.  Skin: Negative for rash and wound.  Neurological: Positive for light-headedness. Negative for weakness, numbness and headaches.  Psychiatric/Behavioral: The patient is nervous/anxious.   All other systems reviewed and are negative.    Physical Exam Updated Vital Signs BP 137/88 (BP Location: Left Arm)    Pulse 80    Temp 98.7 F (37.1 C) (Oral)    Resp 19    Ht 5\' 3"  (1.6 m)    Wt 86.2 kg    SpO2 98%    BMI 33.66 kg/m   Physical Exam Vitals signs and nursing note reviewed.  Constitutional:      Appearance: She is well-developed.     Comments: Anxious appearing  HENT:     Head: Normocephalic and atraumatic.     Nose: Nose normal.     Mouth/Throat:     Mouth: Mucous membranes are moist.  Eyes:     Extraocular Movements: Extraocular movements intact.     Pupils: Pupils are equal, round, and reactive to light.  Neck:     Musculoskeletal: Normal range of motion and neck supple. No neck rigidity or muscular tenderness.  Cardiovascular:     Rate and Rhythm: Normal rate and  regular rhythm.     Heart sounds: No murmur. No friction rub. No gallop.   Pulmonary:     Effort: Pulmonary effort is normal. No respiratory distress.  Breath sounds: Normal breath sounds. No stridor. No wheezing, rhonchi or rales.  Chest:     Chest wall: No tenderness.  Abdominal:     General: Bowel sounds are normal. There is no distension.     Palpations: Abdomen is soft.     Tenderness: There is abdominal tenderness. There is right CVA tenderness. There is no left CVA tenderness, guarding or rebound.     Comments: Right lower quadrant fullness and tenderness to palpation.  No rebound or guarding.  Right CVA tenderness to percussion.  Musculoskeletal: Normal range of motion.        General: No swelling, tenderness, deformity or signs of injury.     Right lower leg: No edema.     Left lower leg: No edema.  Lymphadenopathy:     Cervical: No cervical adenopathy.  Skin:    General: Skin is warm and dry.     Capillary Refill: Capillary refill takes less than 2 seconds.     Findings: No erythema or rash.  Neurological:     General: No focal deficit present.     Mental Status: She is alert and oriented to person, place, and time.     Comments: Moves all extremities without focal deficit.  Sensation intact.  Psychiatric:        Behavior: Behavior normal.      ED Treatments / Results  Labs (all labs ordered are listed, but only abnormal results are displayed) Labs Reviewed  COMPREHENSIVE METABOLIC PANEL - Abnormal; Notable for the following components:      Result Value   Glucose, Bld 134 (*)    ALT 57 (*)    All other components within normal limits  URINALYSIS, ROUTINE W REFLEX MICROSCOPIC - Abnormal; Notable for the following components:   Color, Urine AMBER (*)    APPearance CLOUDY (*)    Hgb urine dipstick LARGE (*)    Protein, ur 100 (*)    Leukocytes,Ua TRACE (*)    RBC / HPF >50 (*)    Bacteria, UA FEW (*)    All other components within normal limits  CBC WITH  DIFFERENTIAL/PLATELET  LIPASE, BLOOD    EKG None  Radiology Ct Abdomen Pelvis W Contrast  Result Date: 03/09/2019 CLINICAL DATA:  Right lower quadrant, right flank pain, scheduled right orchiectomy, ventral hernia EXAM: CT ABDOMEN AND PELVIS WITH CONTRAST TECHNIQUE: Multidetector CT imaging of the abdomen and pelvis was performed using the standard protocol following bolus administration of intravenous contrast. CONTRAST:  181mL OMNIPAQUE IOHEXOL 300 MG/ML  SOLN COMPARISON:  MR abdomen pelvis, 02/13/2019, CT abdomen pelvis, 12/10/2018 FINDINGS: Lower chest: No acute abnormality. Hepatobiliary: No focal liver abnormality is seen. Status post cholecystectomy. No biliary dilatation. Pancreas: Unremarkable. No pancreatic ductal dilatation or surrounding inflammatory changes. Spleen: Normal in size without focal abnormality. Adrenals/Urinary Tract: Adrenal glands are unremarkable. Kidneys are normal, without renal calculi, focal lesion, or hydronephrosis. Bladder is unremarkable. Stomach/Bowel: Stomach is within normal limits. Appendix appears normal. No evidence of bowel wall thickening, distention, or inflammatory changes. Vascular/Lymphatic: No significant vascular findings are present. No enlarged abdominal or pelvic lymph nodes. Reproductive: Status post hysterectomy. 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 Other: No abdominal wall hernia or abnormality. No abdominopelvic ascites. Musculoskeletal: No acute or significant osseous findings. IMPRESSION: 1. No acute CT findings of the abdomen or pelvis to explain pain. The appendix is not visualized and may be surgically absent. 2.  Status post hysterectomy. 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 Electronically Signed   By: Eddie Candle M.D.   On: 03/09/2019  12:14    Procedures Procedures (including critical care time)  Medications Ordered in ED Medications  HYDROmorphone (DILAUDID) injection 0.5 mg (0.5 mg Intravenous Given 03/09/19 0825)  ondansetron (ZOFRAN) injection 4 mg (4 mg Intravenous Given 03/09/19 0826)  sodium chloride 0.9 % bolus 500 mL (0 mLs Intravenous Stopped 03/09/19 1216)  iohexol (OMNIPAQUE) 300 MG/ML solution 100 mL (100 mLs Intravenous Contrast Given 03/09/19 1156)  HYDROmorphone (DILAUDID) injection 0.5 mg (0.5 mg Intravenous Given 03/09/19 1120)     Initial Impression / Assessment and Plan / ED Course  I have reviewed the triage vital signs and the nursing notes.  Pertinent labs & imaging results that were available during my care of the patient were reviewed by me and considered in my medical decision making (see chart for details).        Patient likely has pain is exacerbation of her ongoing right adnexal mass.  With CT abdomen pelvis to rule out other etiologies of the pain including possible early obstruction.  Also treat with pain medication and antiemetics. Patient's pain is controlled.  No evidence of acute changes on her CT abdomen pelvis.  Advised to follow-up with her OB/GYN tomorrow.  Return precautions given. Final Clinical Impressions(s) / ED Diagnoses   Final diagnoses:  Right lower quadrant abdominal pain    ED Discharge Orders         Ordered    HYDROcodone-acetaminophen (NORCO) 5-325 MG tablet  Every 6 hours PRN     03/09/19 1225    ondansetron (ZOFRAN) 4 MG tablet  Every 6 hours PRN     03/09/19 1225           Julianne Rice, MD 03/09/19 1629

## 2019-03-09 NOTE — ED Notes (Signed)
CT called to inform that pt is ready.

## 2019-03-09 NOTE — ED Notes (Signed)
CT called to inform pt is ready.

## 2019-03-10 ENCOUNTER — Emergency Department (HOSPITAL_COMMUNITY): Payer: Medicaid Other

## 2019-03-10 LAB — GC/CHLAMYDIA PROBE AMP (~~LOC~~) NOT AT ARMC
Chlamydia: NEGATIVE
Neisseria Gonorrhea: NEGATIVE

## 2019-03-10 LAB — WET PREP, GENITAL
Clue Cells Wet Prep HPF POC: NONE SEEN
SPERM: NONE SEEN
Trich, Wet Prep: NONE SEEN
Yeast Wet Prep HPF POC: NONE SEEN

## 2019-03-10 MED ORDER — KETOROLAC TROMETHAMINE 60 MG/2ML IM SOLN
30.0000 mg | Freq: Once | INTRAMUSCULAR | Status: AC
  Administered 2019-03-10: 30 mg via INTRAMUSCULAR
  Filled 2019-03-10: qty 2

## 2019-03-10 NOTE — ED Notes (Signed)
ED Provider at bedside. 

## 2019-03-10 NOTE — ED Notes (Signed)
Patient transported to Ultrasound 

## 2019-03-10 NOTE — ED Notes (Signed)
Reviewed d/c instructions with pt, who verbalized understanding and had no outstanding questions. Pt armband & labels removed and placed in shred bin. Pt departed in NAD, refused use of wheelchair.

## 2019-03-10 NOTE — ED Provider Notes (Signed)
[place] EMERGENCY DEPARTMENT Provider Note  CSN: [id number] Arrival date & time: 03/09/19 2253  Chief Complaint(s) Vaginal Discomfort  HPI Katherine Miller is a 63 y.o. female with a history of right ovarian mass scheduled for surgery who presents to the emergency department with sudden worsening of right adnexal pain radiating down to her vagina which began 6 to 7 hours prior to arrival.  Patient was actually seen here earlier yesterday morning for the right axilla pain and had a unremarkable CT scan.  Patient reports that there is no alleviating or aggravating factors to the pain.  States that she is sexually active.  Last sexual encounter was 4 days ago with her husband from which she is separated.  She also endorsed vaginal wetness which is unusual for her.  She denies any dysuria.  No nausea or vomiting.  Denies any other physical complaints.  HPI  Past Medical History Past Medical History:  Diagnosis Date   Allergy    Anemia    Anxiety    Bronchitis    GERD (gastroesophageal reflux disease)    Hernia, abdominal    History of prediabetes    Ovarian mass, right 06/27/2017   Right renal mass 12/24/2009   Patient Active Problem List   Diagnosis Date Noted   Myofascial pain syndrome, cervical 11/04/2018   Arm paresthesia, left 11/04/2018   Pain in both lower extremities 08/13/2018   Chronic left shoulder pain 07/25/2018   Ovarian mass, right 06/27/2017   Hyperglycemia 12/07/2016   Chronic pain syndrome 07/12/2015   Rotator cuff syndrome of left shoulder 04/26/2015   Neck pain 01/12/2014   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 Authorizing Provider  cyclobenzaprine (FLEXERIL) 5 MG tablet Take 1-2 tablets (5-10 mg total) by mouth 3 (three) times daily as needed for muscle spasms.  07/08/18   Leandrew Koyanagi, MD  diclofenac sodium (VOLTAREN) 1 % GEL Apply 2 g topically 4 (four) times daily. Patient taking differently: Apply 2 g topically as needed.  08/13/18   Aundra Dubin, PA-C  diclofenac sodium (VOLTAREN) 1 % GEL Apply 4 g topically 4 (four) times daily. 01/31/19   Recardo Evangelist, PA-C  dicyclomine (BENTYL) 10 MG capsule Take 1 capsule (10 mg total) by mouth 3 (three) times daily as needed for spasms. 12/10/18   Davonna Belling, MD  HYDROcodone-acetaminophen (NORCO) 5-325 MG tablet Take 1 tablet by mouth every 6 (six) hours as needed for severe pain. 03/09/19   Julianne Rice, MD  lidocaine (LIDODERM) 5 % Place 1 patch onto the skin daily. Remove & Discard patch within 12 hours or as directed by MD 01/31/19   Recardo Evangelist, PA-C  linaclotide [facility]) 145 MCG CAPS capsule Take 1 capsule (145 mcg total) by mouth daily before breakfast. 11/18/18   Cirigliano, Vito V, DO  omeprazole (PRILOSEC) 20 MG capsule Take 1 capsule (20 mg total) by mouth daily. 11/18/18   Cirigliano, Vito V, DO  ondansetron (ZOFRAN) 4 MG tablet Take 1 tablet (4 mg total) by mouth every 6 (six) hours as needed for nausea or vomiting. 03/09/19   Julianne Rice, MD  Past Surgical History Past Surgical History:  Procedure Laterality Date   ABDOMINAL HYSTERECTOMY  1984   ovaries intact   CHOLECYSTECTOMY  1984   open   COLONOSCOPY     Michela Pitcher it was done in 2017 or 2018   ESOPHAGOGASTRODUODENOSCOPY     Said she had one years ago but not positive of the year    [place]  09/04/2018   Attempted removal of an ovary mass but couldn't do it due to the scar tissue. Done at [place] History Family History  Problem Relation Age of Onset   Depression Mother    Heart disease Father        AMI age 63 yo    Stroke Father        several ministrokes--cause of death   Cancer Brother        unknown type   Breast cancer Neg Hx    Colon cancer Neg Hx    Esophageal cancer Neg Hx     Social History Social History   Tobacco Use   Smoking status: Never Smoker   Smokeless tobacco: Never Used  Substance Use Topics   Alcohol use: No   Drug use: No   Allergies Morphine  Review of Systems Review of Systems All other systems are reviewed and are negative for acute change except as noted in the HPI  Physical Exam Vital Signs  I have reviewed the triage vital signs BP 122/77 (BP Location: Right Arm)    Pulse 78    Temp 97.8 F (36.6 C) (Oral)    Resp 18    SpO2 95%   Physical Exam Vitals signs reviewed. Exam conducted with a chaperone present.  Constitutional:      General: She is not in acute distress.    Appearance: She is well-developed. She is not diaphoretic.  HENT:     Head: Normocephalic and atraumatic.     Right Ear: External ear normal.     Left Ear: External ear normal.     Nose: Nose normal.  Eyes:     General: No scleral icterus.    Conjunctiva/sclera: Conjunctivae normal.  Neck:     Musculoskeletal: Normal range of motion.     Trachea: Phonation normal.  Cardiovascular:     Rate and Rhythm: Normal rate and regular rhythm.  Pulmonary:     Effort: Pulmonary effort is normal. No respiratory distress.     Breath sounds: No stridor.  Abdominal:     General: There is no distension.     Tenderness: There is no guarding or rebound.  Genitourinary:    Comments: Surgically removed cervix.  Mild vaginal wall irritation.  Clear discharge.  No purulence.  Right adnexal tenderness to palpation. Musculoskeletal: Normal range of motion.  Neurological:     Mental Status: She is alert and oriented to person, place, and time.  Psychiatric:        Behavior: Behavior normal.     ED Results and Treatments Labs (all labs ordered are listed, but only abnormal results  are displayed) Labs Reviewed  WET PREP, GENITAL - Abnormal; Notable for the following components:      Result Value   WBC, Wet Prep HPF POC FEW (*)    All other components within normal limits  URINALYSIS, ROUTINE W REFLEX MICROSCOPIC - Abnormal; Notable for the following components:   Color, Urine STRAW (*)    Hgb urine dipstick  SMALL (*)    All other components within normal limits  GC/CHLAMYDIA PROBE AMP (Day) NOT AT [facility]                                                                                                                         EKG  EKG Interpretation  Date/Time:    Ventricular Rate:    PR Interval:    QRS Duration:   QT Interval:    QTC Calculation:   R Axis:     Text Interpretation:        Radiology US Transvaginal Non-ob  Result Date: 03/10/2019 CLINICAL DATA:  Right adnexal pain EXAM: TRANSABDOMINAL AND TRANSVAGINAL ULTRASOUND OF PELVIS DOPPLER ULTRASOUND OF OVARIES TECHNIQUE: Both transabdominal and transvaginal ultrasound examinations of the pelvis were performed. Transabdominal technique was performed for global imaging of the pelvis including uterus, ovaries, adnexal regions, and pelvic cul-de-sac. It was necessary to proceed with endovaginal exam following the transabdominal exam to visualize the ovaries/adnexa. Color and duplex Doppler ultrasound was utilized to evaluate blood flow to the ovaries. COMPARISON:  CT 03/09/2019.  MRI 02/13/2019 FINDINGS: Uterus Measurements: Prior hysterectomy. Endometrium Thickness: N/A. Right ovary Measurements: 5.8 x 3.7 x 5.3 cm = volume: 57.8 mL. Multiloculated cystic area noted as seen on prior imaging measuring 4.1 x 3.9 x 2.7 cm. This is best characterized on prior MRI. Left ovary Measurements: Not visualized.  No adnexal mass seen. Pulsed Doppler evaluation of the right ovary demonstrates normal low-resistance arterial and venous waveforms. Other findings No abnormal free fluid. IMPRESSION: 4.1 cm complex septated  cyst within the right ovary as seen on prior CT and MRI. This is best characterized by MRI. No evidence of torsion. Prior hysterectomy. Electronically Signed   By: Rolm Baptise M.D.   On: 03/10/2019 01:38   US Pelvis Complete  Result Date: 03/10/2019 CLINICAL DATA:  Right adnexal pain EXAM: TRANSABDOMINAL AND TRANSVAGINAL ULTRASOUND OF PELVIS DOPPLER ULTRASOUND OF OVARIES TECHNIQUE: Both transabdominal and transvaginal ultrasound examinations of the pelvis were performed. Transabdominal technique was performed for global imaging of the pelvis including uterus, ovaries, adnexal regions, and pelvic cul-de-sac. It was necessary to proceed with endovaginal exam following the transabdominal exam to visualize the ovaries/adnexa. Color and duplex Doppler ultrasound was utilized to evaluate blood flow to the ovaries. COMPARISON:  CT 03/09/2019.  MRI 02/13/2019 FINDINGS: Uterus Measurements: Prior hysterectomy. Endometrium Thickness: N/A. Right ovary Measurements: 5.8 x 3.7 x 5.3 cm = volume: 57.8 mL. Multiloculated cystic area noted as seen on prior imaging measuring 4.1 x 3.9 x 2.7 cm. This is best characterized on prior MRI. Left ovary Measurements: Not visualized.  No adnexal mass seen. Pulsed Doppler evaluation of the right ovary demonstrates normal low-resistance arterial and venous waveforms. Other findings No abnormal free fluid. IMPRESSION: 4.1 cm complex septated cyst within the right ovary as seen on prior CT and MRI. This is best characterized by MRI. No evidence of torsion. Prior hysterectomy. Electronically Signed   By: Lennette Bihari  [place] M.D.   On: 03/10/2019 01:38   Ct Abdomen Pelvis W Contrast  Result Date: 03/09/2019 CLINICAL DATA:  Right lower quadrant, right flank pain, scheduled right orchiectomy, ventral hernia EXAM: CT ABDOMEN AND PELVIS WITH CONTRAST TECHNIQUE: Multidetector CT imaging of the abdomen and pelvis was performed using the standard protocol following bolus administration of intravenous  contrast. CONTRAST:  163mL OMNIPAQUE IOHEXOL 300 MG/ML  SOLN COMPARISON:  MR abdomen pelvis, 02/13/2019, CT abdomen pelvis, 12/10/2018 FINDINGS: Lower chest: No acute abnormality. Hepatobiliary: No focal liver abnormality is seen. Status post cholecystectomy. No biliary dilatation. Pancreas: Unremarkable. No pancreatic ductal dilatation or surrounding inflammatory changes. Spleen: Normal in size without focal abnormality. Adrenals/Urinary Tract: Adrenal glands are unremarkable. Kidneys are normal, without renal calculi, focal lesion, or hydronephrosis. Bladder is unremarkable. Stomach/Bowel: Stomach is within normal limits. Appendix appears normal. No evidence of bowel wall thickening, distention, or inflammatory changes. Vascular/Lymphatic: No significant vascular findings are present. No enlarged abdominal or pelvic lymph nodes. Reproductive: Status post hysterectomy. 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 Other: No abdominal wall hernia or abnormality. No abdominopelvic ascites. Musculoskeletal: No acute or significant osseous findings. IMPRESSION: 1. No acute CT findings of the abdomen or pelvis to explain pain. The appendix is not visualized and may be surgically absent. 2. Status post hysterectomy. 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 Electronically Signed   By: Eddie Candle M.D.   On: 03/09/2019 12:14   [place] Art/ven Flow Abd Pelv Doppler  Result Date: 03/10/2019 CLINICAL DATA:  Right adnexal pain EXAM: TRANSABDOMINAL AND TRANSVAGINAL ULTRASOUND OF PELVIS DOPPLER ULTRASOUND OF OVARIES TECHNIQUE: Both transabdominal and transvaginal ultrasound examinations of the pelvis were performed. Transabdominal technique was performed for global imaging of the pelvis including uterus, ovaries, adnexal regions, and  pelvic cul-de-sac. It was necessary to proceed with endovaginal exam following the transabdominal exam to visualize the ovaries/adnexa. Color and duplex Doppler ultrasound was utilized to evaluate blood flow to the ovaries. COMPARISON:  CT 03/09/2019.  MRI 02/13/2019 FINDINGS: Uterus Measurements: Prior hysterectomy. Endometrium Thickness: N/A. Right ovary Measurements: 5.8 x 3.7 x 5.3 cm = volume: 57.8 mL. Multiloculated cystic area noted as seen on prior imaging measuring 4.1 x 3.9 x 2.7 cm. This is best characterized on prior MRI. Left ovary Measurements: Not visualized.  No adnexal mass seen. Pulsed Doppler evaluation of the right ovary demonstrates normal low-resistance arterial and venous waveforms. Other findings No abnormal free fluid. IMPRESSION: 4.1 cm complex septated cyst within the right ovary as seen on prior CT and MRI. This is best characterized by MRI. No evidence of torsion. Prior hysterectomy. Electronically Signed   By: Rolm Baptise M.D.   On: 03/10/2019 01:38   Pertinent labs & imaging results that were available during my care of the patient were reviewed by me and considered in my medical decision making (see chart for details).  Medications Ordered in ED Medications  ketorolac (TORADOL) injection 30 mg (30 mg Intramuscular Given 03/10/19 0055)  Procedures Procedures  (including critical care time)  Medical Decision Making / ED Course I have reviewed the nursing notes for this encounter and the patient's prior records (if available in EHR or on provided paperwork).    Patient had reassuring work-up less than 24 hours ago with regards to other intra-abdominal inflammatory/infectious process such as appendicitis.  Given her history of right adnexal mass, and sudden worsening of the pain.  Will obtain ultrasound to rule out torsion.  Ultrasound was  negative.  Pelvic exam revealed mild vaginal wall irritation with clear discharge.  Not suspicious for PID.  GC/chlamydia culture sent.  Wet prep without trichomonas or bacterial vaginosis.  No yeast infection.  UA without evidence of infection.  The patient appears reasonably screened and/or stabilized for discharge and I doubt any other medical condition or other [facility] requiring further screening, evaluation, or treatment in the ED at this time prior to discharge.  The patient is safe for discharge with strict return precautions.   Final Clinical Impression(s) / ED Diagnoses Final diagnoses:  Pelvic pain  Right tubo-ovarian mass   Disposition: Discharge  Condition: Good  I have discussed the results, Dx and Tx plan with the patient who expressed understanding and agree(s) with the plan. Discharge instructions discussed at great length. The patient was given strict return precautions who verbalized understanding of the instructions. No further questions at time of discharge.    ED Discharge Orders    None       Follow Up: Gynecology   as scheduled      This chart was dictated using voice recognition software.  Despite best efforts to proofread,  errors can occur which can change the documentation meaning.   Fatima Blank, MD 03/10/19 [phone]

## 2019-04-01 ENCOUNTER — Encounter (HOSPITAL_COMMUNITY): Payer: Self-pay | Admitting: Emergency Medicine

## 2019-04-01 ENCOUNTER — Emergency Department (HOSPITAL_COMMUNITY): Payer: Medicaid Other

## 2019-04-01 ENCOUNTER — Other Ambulatory Visit: Payer: Self-pay

## 2019-04-01 ENCOUNTER — Emergency Department (HOSPITAL_COMMUNITY)
Admission: EM | Admit: 2019-04-01 | Discharge: 2019-04-01 | Disposition: A | Discharge status: Home / Self Care | Payer: Medicaid Other | Attending: Emergency Medicine | Admitting: Emergency Medicine

## 2019-04-01 DIAGNOSIS — Y9389 Activity, other specified: Secondary | ICD-10-CM | POA: Diagnosis not present

## 2019-04-01 DIAGNOSIS — W228XXA Striking against or struck by other objects, initial encounter: Secondary | ICD-10-CM | POA: Insufficient documentation

## 2019-04-01 DIAGNOSIS — Y999 Unspecified external cause status: Secondary | ICD-10-CM | POA: Insufficient documentation

## 2019-04-01 DIAGNOSIS — S060X0A Concussion without loss of consciousness, initial encounter: Secondary | ICD-10-CM | POA: Diagnosis not present

## 2019-04-01 DIAGNOSIS — Y92009 Unspecified place in unspecified non-institutional (private) residence as the place of occurrence of the external cause: Secondary | ICD-10-CM | POA: Insufficient documentation

## 2019-04-01 DIAGNOSIS — S0990XA Unspecified injury of head, initial encounter: Secondary | ICD-10-CM | POA: Diagnosis present

## 2019-04-01 MED ORDER — PROCHLORPERAZINE MALEATE 10 MG PO TABS: 10.0000 mg | ORAL_TABLET | Freq: Two times a day (BID) | ORAL | 0 refills | Status: DC | PRN

## 2019-04-01 MED ORDER — ONDANSETRON 4 MG PO TBDP
4.0000 mg | ORAL_TABLET | Freq: Once | ORAL | Status: AC
  Administered 2019-04-01: 4 mg via ORAL
  Filled 2019-04-01: qty 1

## 2019-04-01 NOTE — ED Triage Notes (Signed)
Pt arrives from home c/o a head injury that occurred when she stood up and hit her head on the door of the fridge. Pt complaining of dizziness and nausea and headache since the time of the injury.

## 2019-04-01 NOTE — ED Notes (Signed)
Patient transported to CT 

## 2019-04-01 NOTE — Discharge Instructions (Addendum)
You were evaluated in the Emergency Department and after careful evaluation, we did not find any emergent condition requiring admission or further testing in the hospital.  Your symptoms today seem to be due to a concussion.  Your CT scans today were reassuring, with no evidence of bleeding or broken bones.  As discussed, please practice mental and physical rest until your symptoms resolve.  You can use the medication provided for headache or nausea as needed.  Please return to the Emergency Department if you experience any worsening of your condition.  We encourage you to follow up with a primary care provider.  Thank you for allowing [place] to be a part of your care.

## 2019-04-01 NOTE — ED Notes (Signed)
Pt removed self from monitor and ambulated to restroom.
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
## 2019-07-02 ENCOUNTER — Other Ambulatory Visit: Payer: Self-pay

## 2019-07-02 ENCOUNTER — Ambulatory Visit: Payer: Medicaid Other | Admitting: Family Medicine

## 2019-07-02 ENCOUNTER — Encounter: Payer: Self-pay | Admitting: Family Medicine

## 2019-07-02 VITALS — BP 117/78 | HR 93 | Temp 96.8°F | Ht 63.0 in | Wt 189.6 lb

## 2019-07-02 DIAGNOSIS — M7918 Myalgia, other site: Secondary | ICD-10-CM

## 2019-07-02 DIAGNOSIS — G44229 Chronic tension-type headache, not intractable: Secondary | ICD-10-CM | POA: Diagnosis not present

## 2019-07-02 MED ORDER — AMITRIPTYLINE HCL 25 MG PO TABS: 25.0000 mg | ORAL_TABLET | Freq: Every day | ORAL | 3 refills | Status: DC
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
## 2019-07-02 NOTE — Patient Instructions (Signed)
Start amitriptyline 25mg  at night   Tylenol 1000mg  as directed for acute management as needed   PLEASE ESTABLISH CARE WITH PCP  Follow up in 6 months   Amitriptyline tablets What is this medicine? AMITRIPTYLINE (a mee TRIP ti leen) is used to treat depression. This medicine may be used for other purposes; ask your health care provider or pharmacist if you have questions. COMMON BRAND NAME(S): Elavil, Vanatrip What should I tell my health care provider before I take this medicine? They need to know if you have any of these conditions:  an alcohol problem  asthma, difficulty breathing  bipolar disorder or schizophrenia  difficulty passing urine, prostate trouble  glaucoma  heart disease or previous heart attack  liver disease  over active thyroid  seizures  thoughts or plans of suicide, a previous suicide attempt, or family history of suicide attempt  an unusual or allergic reaction to amitriptyline, other medicines, foods, dyes, or preservatives  pregnant or trying to get pregnant  breast-feeding How should I use this medicine? Take this medicine by mouth with a drink of water. Follow the directions on the prescription label. You can take the tablets with or without food. Take your medicine at regular intervals. Do not take it more often than directed. Do not stop taking this medicine suddenly except upon the advice of your doctor. Stopping this medicine too quickly may cause serious side effects or your condition may worsen. A special MedGuide will be given to you by the pharmacist with each prescription and refill. Be sure to read this information carefully each time. Talk to your pediatrician regarding the use of this medicine in children. Special care may be needed. Overdosage: If you think you have taken too much of this medicine contact a poison control center or emergency room at once. NOTE: This medicine is only for you. Do not share this medicine with others.  What if I miss a dose? If you miss a dose, take it as soon as you can. If it is almost time for your next dose, take only that dose. Do not take double or extra doses. What may interact with this medicine? Do not take this medicine with any of the following medications:  arsenic trioxide  certain medicines used to regulate abnormal heartbeat or to treat other heart conditions  cisapride  droperidol  halofantrine  linezolid  MAOIs like Carbex, Eldepryl, Marplan, Nardil, and Parnate  methylene blue  other medicines for mental depression  phenothiazines like perphenazine, thioridazine and chlorpromazine  pimozide  probucol  procarbazine  sparfloxacin  St. John's Wort This medicine may also interact with the following medications:  atropine and related drugs like hyoscyamine, scopolamine, tolterodine and others  barbiturate medicines for inducing sleep or treating seizures, like phenobarbital  cimetidine  disulfiram  ethchlorvynol  thyroid hormones such as levothyroxine  ziprasidone This list may not describe all possible interactions. Give your health care provider a list of all the medicines, herbs, non-prescription drugs, or dietary supplements you use. Also tell them if you smoke, drink alcohol, or use illegal drugs. Some items may interact with your medicine. What should I watch for while using this medicine? Tell your doctor if your symptoms do not get better or if they get worse. Visit your doctor or health care professional for regular checks on your progress. Because it may take several weeks to see the full effects of this medicine, it is important to continue your treatment as prescribed by your doctor. Patients and their  families should watch out for new or worsening thoughts of suicide or depression. Also watch out for sudden changes in feelings such as feeling anxious, agitated, panicky, irritable, hostile, aggressive, impulsive, severely restless,  overly excited and hyperactive, or not being able to sleep. If this happens, especially at the beginning of treatment or after a change in dose, call your health care professional. Dennis Bast may get drowsy or dizzy. Do not drive, use machinery, or do anything that needs mental alertness until you know how this medicine affects you. Do not stand or sit up quickly, especially if you are an older patient. This reduces the risk of dizzy or fainting spells. Alcohol may interfere with the effect of this medicine. Avoid alcoholic drinks. Do not treat yourself for coughs, colds, or allergies without asking your doctor or health care professional for advice. Some ingredients can increase possible side effects. Your mouth may get dry. Chewing sugarless gum or sucking hard candy, and drinking plenty of water will help. Contact your doctor if the problem does not go away or is severe. This medicine may cause dry eyes and blurred vision. If you wear contact lenses you may feel some discomfort. Lubricating drops may help. See your eye doctor if the problem does not go away or is severe. This medicine can cause constipation. Try to have a bowel movement at least every 2 to 3 days. If you do not have a bowel movement for 3 days, call your doctor or health care professional. This medicine can make you more sensitive to the sun. Keep out of the sun. If you cannot avoid being in the sun, wear protective clothing and use sunscreen. Do not use sun lamps or tanning beds/booths. What side effects may I notice from receiving this medicine? Side effects that you should report to your doctor or health care professional as soon as possible:  allergic reactions like skin rash, itching or hives, swelling of the face, lips, or tongue  anxious  breathing problems  changes in vision  confusion  elevated mood, decreased need for sleep, racing thoughts, impulsive behavior  eye pain  fast, irregular heartbeat  feeling faint or  lightheaded, falls  feeling agitated, angry, or irritable  fever with increased sweating  hallucination, loss of contact with reality  seizures  stiff muscles  suicidal thoughts or other mood changes  tingling, pain, or numbness in the feet or hands  trouble passing urine or change in the amount of urine  trouble sleeping  unusually weak or tired  vomiting  yellowing of the eyes or skin Side effects that usually do not require medical attention (report to your doctor or health care professional if they continue or are bothersome):  change in sex drive or performance  change in appetite or weight  constipation  dizziness  dry mouth  nausea  tired  tremors  upset stomach This list may not describe all possible side effects. Call your doctor for medical advice about side effects. You may report side effects to FDA at 1-800-FDA-1088. Where should I keep my medicine? Keep out of the reach of children. Store at room temperature between 20 and 25 degrees C (68 and 77 degrees F). Throw away any unused medicine after the expiration date. NOTE: This sheet is a summary. It may not cover all possible information. If you have questions about this medicine, talk to your doctor, pharmacist, or health care provider.  2020 Elsevier/Gold Standard (2018-12-02 13:04:32)    General Headache Without Cause A headache  is pain or discomfort that is felt around the head or neck area. There are many causes and types of headaches. In some cases, the cause may not be found. Follow these instructions at home: Watch your condition for any changes. Let your doctor know about them. Take these steps to help with your condition: Managing pain      Take over-the-counter and prescription medicines only as told by your doctor.  Lie down in a dark, quiet room when you have a headache.  If told, put ice on your head and neck area: ? Put ice in a plastic bag. ? Place a towel between your  skin and the bag. ? Leave the ice on for 20 minutes, 2-3 times per day.  If told, put heat on the affected area. Use the heat source that your doctor recommends, such as a moist heat pack or a heating pad. ? Place a towel between your skin and the heat source. ? Leave the heat on for 20-30 minutes. ? Remove the heat if your skin turns bright red. This is very important if you are unable to feel pain, heat, or cold. You may have a greater risk of getting burned.  Keep lights dim if bright lights bother you or make your headaches worse. Eating and drinking  Eat meals on a regular schedule.  If you drink alcohol: ? Limit how much you use to:  0-1 drink a day for women.  0-2 drinks a day for men. ? Be aware of how much alcohol is in your drink. In the [place] one drink equals one 12 oz bottle of beer (355 mL), one 5 oz glass of wine (148 mL), or one 1 oz glass of hard liquor (44 mL).  Stop drinking caffeine, or reduce how much caffeine you drink. General instructions   Keep a journal to find out if certain things bring on headaches. For example, write down: ? What you eat and drink. ? How much sleep you get. ? Any change to your diet or medicines.  Get a massage or try other ways to relax.  Limit stress.  Sit up straight. Do not tighten (tense) your muscles.  Do not use any products that contain nicotine or tobacco. This includes cigarettes, e-cigarettes, and chewing tobacco. If you need help quitting, ask your doctor.  Exercise regularly as told by your doctor.  Get enough sleep. This often means 7-9 hours of sleep each night.  Keep all follow-up visits as told by your doctor. This is important. Contact a doctor if:  Your symptoms are not helped by medicine.  You have a headache that feels different than the other headaches.  You feel sick to your stomach (nauseous) or you throw up (vomit).  You have a fever. Get help right away if:  Your headache gets very bad  quickly.  Your headache gets worse after a lot of physical activity.  You keep throwing up.  You have a stiff neck.  You have trouble seeing.  You have trouble speaking.  You have pain in the eye or ear.  Your muscles are weak or you lose muscle control.  You lose your balance or have trouble walking.  You feel like you will pass out (faint) or you pass out.  You are mixed up (confused).  You have a seizure. Summary  A headache is pain or discomfort that is felt around the head or neck area.  There are many causes and types of headaches. In some  cases, the cause may not be found.  Keep a journal to help find out what causes your headaches. Watch your condition for any changes. Let your doctor know about them.  Contact a doctor if you have a headache that is different from usual, or if your headache is not helped by medicine.  Get help right away if your headache gets very bad, you throw up, you have trouble seeing, you lose your balance, or you have a seizure. This information is not intended to replace advice given to you by your health care provider. Make sure you discuss any questions you have with your health care provider. Document Released: 09/18/2008 Document Revised: 06/30/2018 Document Reviewed: 06/30/2018 Elsevier Patient Education  2020 [employer].

## 2019-07-02 NOTE — Progress Notes (Signed)
Made any corrections needed, and agree with history, physical, neuro exam,assessment and plan as stated.     Mihir Flanigan, MD [facility]  

## 2019-08-25 ENCOUNTER — Ambulatory Visit (HOSPITAL_COMMUNITY)
Admission: EM | Admit: 2019-08-25 | Discharge: 2019-08-25 | Disposition: A | Discharge status: Home / Self Care | Payer: Medicaid Other

## 2019-08-25 ENCOUNTER — Encounter (HOSPITAL_COMMUNITY): Payer: Self-pay | Admitting: *Deleted

## 2019-08-25 ENCOUNTER — Emergency Department (HOSPITAL_COMMUNITY)
Admission: EM | Admit: 2019-08-25 | Discharge: 2019-08-25 | Discharge status: Against Medical Advice | Payer: Medicaid Other | Attending: Emergency Medicine | Admitting: Emergency Medicine

## 2019-08-25 ENCOUNTER — Emergency Department (HOSPITAL_COMMUNITY): Payer: Medicaid Other

## 2019-08-25 ENCOUNTER — Other Ambulatory Visit: Payer: Self-pay

## 2019-08-25 DIAGNOSIS — Z5321 Procedure and treatment not carried out due to patient leaving prior to being seen by health care provider: Secondary | ICD-10-CM | POA: Insufficient documentation

## 2019-08-25 DIAGNOSIS — R0789 Other chest pain: Secondary | ICD-10-CM | POA: Diagnosis present

## 2019-08-25 LAB — TROPONIN I (HIGH SENSITIVITY): Troponin I (High Sensitivity): 4 ng/L (ref <18)

## 2019-08-25 LAB — CBC
HCT: 36.4 % (ref 36.0–46.0)
Hemoglobin: 11.8 g/dL — ABNORMAL LOW (ref 12.0–15.0)
MCH: 28.6 pg (ref 26.0–34.0)
MCHC: 32.4 g/dL (ref 30.0–36.0)
MCV: 88.1 fL (ref 80.0–100.0)
Platelets: 334 10*3/uL (ref 150–400)
RBC: 4.13 MIL/uL (ref 3.87–5.11)
RDW: 13.5 % (ref 11.5–15.5)
WBC: 7.3 10*3/uL (ref 4.0–10.5)
nRBC: 0 % (ref 0.0–0.2)

## 2019-08-25 LAB — BASIC METABOLIC PANEL
Anion gap: 10 (ref 5–15)
BUN: 12 mg/dL (ref 8–23)
CO2: 22 mmol/L (ref 22–32)
Calcium: 8.8 mg/dL — ABNORMAL LOW (ref 8.9–10.3)
Chloride: 108 mmol/L (ref 98–111)
Creatinine, Ser: 0.81 mg/dL (ref 0.44–1.00)
GFR calc Af Amer: >60 mL/min (ref >60)
GFR calc non Af Amer: >60 mL/min (ref >60)
Glucose, Bld: 121 mg/dL — ABNORMAL HIGH (ref 70–99)
Potassium: 3.9 mmol/L (ref 3.5–5.1)
Sodium: 140 mmol/L (ref 135–145)

## 2019-08-25 MED ORDER — SODIUM CHLORIDE 0.9% FLUSH: 3.0000 mL | Freq: Once | INTRAVENOUS | Status: DC

## 2019-08-25 NOTE — ED Notes (Signed)
Per Dr Meda Coffee Pt was deferred to ED for further evaluation.

## 2019-08-25 NOTE — ED Notes (Signed)
Pt decided to go home, stating that she is starting to feel sore and can't sit any longer.

## 2019-08-25 NOTE — ED Triage Notes (Signed)
To ED for eval of cp, sob, and leg cramps for the past few days. States when she lays down at night the pain in her chest gets worse. States she has known reflux but this feels different. Appears in nad in triage

## 2019-10-15 ENCOUNTER — Ambulatory Visit: Payer: Medicaid Other | Admitting: Physician Assistant

## 2019-10-27 ENCOUNTER — Ambulatory Visit: Payer: Medicaid Other | Admitting: Orthopaedic Surgery

## 2019-12-25 ENCOUNTER — Encounter (HOSPITAL_COMMUNITY): Payer: Self-pay | Admitting: Emergency Medicine

## 2019-12-25 ENCOUNTER — Emergency Department (HOSPITAL_COMMUNITY): Payer: Medicaid Other

## 2019-12-25 ENCOUNTER — Other Ambulatory Visit: Payer: Self-pay

## 2019-12-25 ENCOUNTER — Emergency Department (HOSPITAL_COMMUNITY)
Admission: EM | Admit: 2019-12-25 | Discharge: 2019-12-26 | Disposition: A | Discharge status: Home / Self Care | Payer: Medicaid Other | Attending: Emergency Medicine | Admitting: Emergency Medicine

## 2019-12-25 DIAGNOSIS — R0789 Other chest pain: Secondary | ICD-10-CM | POA: Insufficient documentation

## 2019-12-25 DIAGNOSIS — R0602 Shortness of breath: Secondary | ICD-10-CM | POA: Diagnosis not present

## 2019-12-25 DIAGNOSIS — R079 Chest pain, unspecified: Secondary | ICD-10-CM

## 2019-12-25 LAB — CBC
HCT: 37.2 % (ref 36.0–46.0)
Hemoglobin: 12.5 g/dL (ref 12.0–15.0)
MCH: 28.7 pg (ref 26.0–34.0)
MCHC: 33.6 g/dL (ref 30.0–36.0)
MCV: 85.3 fL (ref 80.0–100.0)
Platelets: 333 10*3/uL (ref 150–400)
RBC: 4.36 MIL/uL (ref 3.87–5.11)
RDW: 13.4 % (ref 11.5–15.5)
WBC: 6.6 10*3/uL (ref 4.0–10.5)
nRBC: 0 % (ref 0.0–0.2)

## 2019-12-25 LAB — BASIC METABOLIC PANEL
Anion gap: 10 (ref 5–15)
BUN: 10 mg/dL (ref 8–23)
CO2: 23 mmol/L (ref 22–32)
Calcium: 8.8 mg/dL — ABNORMAL LOW (ref 8.9–10.3)
Chloride: 107 mmol/L (ref 98–111)
Creatinine, Ser: 0.82 mg/dL (ref 0.44–1.00)
GFR calc Af Amer: >60 mL/min (ref >60)
GFR calc non Af Amer: >60 mL/min (ref >60)
Glucose, Bld: 114 mg/dL — ABNORMAL HIGH (ref 70–99)
Potassium: 3.6 mmol/L (ref 3.5–5.1)
Sodium: 140 mmol/L (ref 135–145)

## 2019-12-25 LAB — TROPONIN I (HIGH SENSITIVITY): Troponin I (High Sensitivity): 3 ng/L (ref <18)

## 2019-12-25 MED ORDER — SODIUM CHLORIDE 0.9% FLUSH: 3.0000 mL | Freq: Once | INTRAVENOUS | Status: DC

## 2019-12-25 NOTE — ED Triage Notes (Signed)
Patient reports intermittent central chest pain with SOB and nausea onset today , no emesis or diaphoresis .

## 2019-12-25 NOTE — ED Notes (Signed)
Pt inquiring about wait, updated on delay at this time, triage process and monitoring in lobby. Offered pt nausea meds, refused at this time.

## 2019-12-26 LAB — TROPONIN I (HIGH SENSITIVITY): Troponin I (High Sensitivity): 4 ng/L (ref <18)

## 2019-12-26 MED ORDER — ACETAMINOPHEN 500 MG PO TABS
1000.0000 mg | ORAL_TABLET | Freq: Once | ORAL | Status: DC
  Filled 2019-12-26: qty 2

## 2019-12-26 MED ORDER — ACETAMINOPHEN 500 MG PO TABS: 1000.0000 mg | ORAL_TABLET | Freq: Once | ORAL | Status: DC

## 2019-12-26 MED ORDER — DICYCLOMINE HCL 20 MG PO TABS: 20.0000 mg | ORAL_TABLET | Freq: Two times a day (BID) | ORAL | 0 refills | Status: DC

## 2019-12-26 MED ORDER — METOCLOPRAMIDE HCL 10 MG PO TABS
10.0000 mg | ORAL_TABLET | Freq: Once | ORAL | Status: AC
  Administered 2019-12-26: 10 mg via ORAL
  Filled 2019-12-26: qty 1

## 2019-12-26 MED ORDER — ACETAMINOPHEN 500 MG PO TABS
500.0000 mg | ORAL_TABLET | Freq: Once | ORAL | Status: DC
  Administered 2019-12-26: 500 mg via ORAL

## 2019-12-26 NOTE — ED Notes (Signed)
Patient requested 500mg  APAP.

## 2019-12-26 NOTE — Discharge Instructions (Addendum)
Please follow up with your gastroenterologist for evaluation of persistent pain since your recent EGD and dilation. Take Bentyl as prescribed for likely esophageal spasm.   Make an appointment with your primary care provider for further evaluation of blood pressure as well as routine primary care concerns.
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
## 2019-12-26 NOTE — ED Notes (Signed)
This tech attempted x1 to draw blood and was unsuccessful.

## 2019-12-26 NOTE — ED Notes (Signed)
Pt given warm blanket.

## 2020-01-05 ENCOUNTER — Ambulatory Visit: Payer: Self-pay | Admitting: Family Medicine

## 2020-07-18 ENCOUNTER — Ambulatory Visit (INDEPENDENT_AMBULATORY_CARE_PROVIDER_SITE_OTHER): Payer: Medicare Other

## 2020-07-18 ENCOUNTER — Encounter: Payer: Self-pay | Admitting: Podiatrist

## 2020-07-18 ENCOUNTER — Other Ambulatory Visit: Payer: Self-pay

## 2020-07-18 ENCOUNTER — Ambulatory Visit (INDEPENDENT_AMBULATORY_CARE_PROVIDER_SITE_OTHER): Payer: Medicare Other | Admitting: Podiatrist

## 2020-07-18 DIAGNOSIS — M79671 Pain in right foot: Secondary | ICD-10-CM

## 2020-07-18 DIAGNOSIS — M722 Plantar fascial fibromatosis: Secondary | ICD-10-CM

## 2020-07-18 NOTE — Patient Instructions (Signed)
Plantar Fasciitis (Heel Spur Syndrome) with Rehab The plantar fascia is a fibrous, ligament-like, soft-tissue structure that spans the bottom of the foot. Plantar fasciitis is a condition that causes pain in the foot due to inflammation of the tissue. SYMPTOMS   Pain and tenderness on the underneath side of the foot.  Pain that worsens with standing or walking. CAUSES  Plantar fasciitis is caused by irritation and injury to the plantar fascia on the underneath side of the foot. Common mechanisms of injury include:  Direct trauma to bottom of the foot.  Damage to a small nerve that runs under the foot where the main fascia attaches to the heel bone. Stress placed on the plantar fascia due to any mild increased activity or injury RISK INCREASES WITH:   Obesity.  Poor strength and flexibility.  Improperly fitted shoes.  Tight calf muscles.  Flat feet.  Failure to warm-up properly before activity.  PREVENTION  Warm up and stretch properly before activity.  Strength, flexibility  Maintain a health body weight.  Avoid stress on the plantar fascia.  Wear properly fitted shoes, including arch supports for individuals who have flat feet. PROGNOSIS  If treated properly, then the symptoms of plantar fasciitis usually resolve without surgery. However, occasionally surgery is necessary. RELATED COMPLICATIONS   Recurrent symptoms that may result in a chronic condition.  Problems of the lower back that are caused by compensating for the injury, such as limping.  Pain or weakness of the foot during push-off following surgery.  Chronic inflammation, scarring, and partial or complete fascia tear, occurring more often from repeated injections. TREATMENT  Treatment initially involves the use of ice and medication to help reduce pain and inflammation. The use of strengthening and stretching exercises may help reduce pain with activity, especially stretches of the Achilles tendon.  Your  caregiver may recommend that you use arch supports to help reduce stress on the plantar fascia. Often, corticosteroid injections are given to reduce inflammation. If symptoms persist for greater than 6 months despite non-surgical (conservative), then surgery may be recommended.  MEDICATION   If pain medication is necessary, then nonsteroidal anti-inflammatory medications, such as aspirin and ibuprofen, or other minor pain relievers, such as acetaminophen, are often recommended. Corticosteroid injections may be given by your caregiver.  HEAT AND COLD  Cold treatment (icing) relieves pain and reduces inflammation. Cold treatment should be applied for 10 to 15 minutes every 2 to 3 hours for inflammation and pain and immediately after any activity that aggravates your symptoms. Use ice packs or massage the area with a piece of ice (ice massage).  Heat treatment may be used prior to performing the stretching and strengthening activities prescribed by your caregiver, physical therapist, or athletic trainer. Use a heat pack or soak the injury in warm water. SEEK IMMEDIATE MEDICAL CARE IF:  Treatment seems to offer no benefit, or the condition worsens.  Any medications produce adverse side effects.  Perform this particular stretch daily first thing in the morning and before you go to bed. Hold for 30 seconds.    Try all the exercises and choose your favorite 2 to 3 to perform daily--  EXERCISES-- perform each exercise a total of 10-15 repetitions.  Hold for 30 seconds and perform 3 times per day   RANGE OF MOTION (ROM) AND STRETCHING EXERCISES - Plantar Fasciitis (Heel Spur Syndrome) These exercises may help you when beginning to rehabilitate your injury.   While completing these exercises, remember:   Restoring tissue flexibility  helps normal motion to return to the joints. This allows healthier, less painful movement and activity.  An effective stretch should be held for at least 30  seconds.  A stretch should never be painful. You should only feel a gentle lengthening or release in the stretched tissue. RANGE OF MOTION - Toe Extension, Flexion  Sit with your right / left leg crossed over your opposite knee.  Grasp your toes and gently pull them back toward the top of your foot. You should feel a stretch on the bottom of your toes and/or foot.  Hold this stretch for __________ seconds.  Now, gently pull your toes toward the bottom of your foot. You should feel a stretch on the top of your toes and or foot.  Hold this stretch for __________ seconds. Repeat __________ times. Complete this stretch __________ times per day.  RANGE OF MOTION - Ankle Dorsiflexion, Active Assisted  Remove shoes and sit on a chair that is preferably not on a carpeted surface.  Place right / left foot under knee. Extend your opposite leg for support.  Keeping your heel down, slide your right / left foot back toward the chair until you feel a stretch at your ankle or calf. If you do not feel a stretch, slide your bottom forward to the edge of the chair, while still keeping your heel down.  Hold this stretch for __________ seconds. Repeat __________ times. Complete this stretch __________ times per day.  STRETCH  Gastroc, Standing  Place hands on wall.  Extend right / left leg, keeping the front knee somewhat bent.  Slightly point your toes inward on your back foot.  Keeping your right / left heel on the floor and your knee straight, shift your weight toward the wall, not allowing your back to arch.  You should feel a gentle stretch in the right / left calf. Hold this position for __________ seconds. Repeat __________ times. Complete this stretch __________ times per day. STRETCH  Soleus, Standing  Place hands on wall.  Extend right / left leg, keeping the other knee somewhat bent.  Slightly point your toes inward on your back foot.  Keep your right / left heel on the floor,  bend your back knee, and slightly shift your weight over the back leg so that you feel a gentle stretch deep in your back calf.  Hold this position for __________ seconds. Repeat __________ times. Complete this stretch __________ times per day. STRETCH  Gastrocsoleus, Standing  Note: This exercise can place a lot of stress on your foot and ankle. Please complete this exercise only if specifically instructed by your caregiver.   Place the ball of your right / left foot on a step, keeping your other foot firmly on the same step.  Hold on to the wall or a rail for balance.  Slowly lift your other foot, allowing your body weight to press your heel down over the edge of the step.  You should feel a stretch in your right / left calf.  Hold this position for __________ seconds.  Repeat this exercise with a slight bend in your right / left knee. Repeat __________ times. Complete this stretch __________ times per day.  STRENGTHENING EXERCISES - Plantar Fasciitis (Heel Spur Syndrome)  These exercises may help you when beginning to rehabilitate your injury. They may resolve your symptoms with or without further involvement from your physician, physical therapist or athletic trainer. While completing these exercises, remember:   Muscles can gain both  the endurance and the strength needed for everyday activities through controlled exercises.  Complete these exercises as instructed by your physician, physical therapist or athletic trainer. Progress the resistance and repetitions only as guided.

## 2020-07-18 NOTE — Progress Notes (Signed)
  Chief Complaint  Katherine Miller presents with  . Foot Pain    R foot, medial side of heel/ankle. x1 yr. Pt was unable to rate her pain on a 1-10 scale. Pt stated, "I was diagnosed with a sprain last year. The pain hasn't improved. It comes and goes. I can't walk on it when it hurts. Wearing a brace has helped".      HPI: Katherine Miller is 64 y.o. female who presents today for the concerns as listed above. She relates her foot was sprained last year and she has had pain since.  She relates pain along the medial heel and arch and states she has pain with first step in the morning and after sitting for long periods of time.    Review of Systems No fevers, chills, nausea, muscle aches, no difficulty breathing, no calf pain, no chest pain or shortness of breath.   Physical Exam  GENERAL APPEARANCE: Alert, conversant. Appropriately groomed. No acute distress.   VASCULAR: Pedal pulses palpable DP and PT bilateral.  Capillary refill time is immediate to all digits,  Proximal to distal cooling it warm to warm.  Digital perfusion adequate.   NEUROLOGIC: sensation is intact epicritically and protectively to 5.07 monofilament at 5/5 sites bilateral.  Light touch is intact bilateral, vibratory sensation intact bilateral, achilles tendon reflex is intact bilateral.   MUSCULOSKELETAL: acceptable muscle strength, tone and stability bilateral.  No gross boney pedal deformities noted.  No pain, crepitus or limitation noted with foot and ankle range of motion bilateral. Pain on palpation right heel at the insertion of the plantar fascia on the medial calcaneal tubercle is noted.  No pain along the tarsal canal noted.   DERMATOLOGIC: skin is warm, supple, and dry.  No open lesions noted.  No rash, no pre ulcerative lesions. Digital nails are asymptomatic.    XRAY: 3 views right foot are obtained.  No sign of fracture or dislocation noted.  Normal osseous mineralization noted.  No acute osseous abnormalities noted.    Assessment     ICD-10-CM   1. Right foot pain  M79.671 DG Foot Complete Right  2. Plantar fasciitis, right  M72.2      Plan Discussed treatment options and at this time a plantar fascial injection was recommended.  The Katherine Miller agreed and a sterile skin prep was applied.  An injection consisting of kenalog and marcaine mixture was infiltrated at the point of maximal tenderness on the right Heel.  The Katherine Miller tolerated this well and was given instructions for aftercare.  A plantar fascial brace was dispensed for her use and she will return for follow up in 3-4 weeks.

## 2020-07-26 ENCOUNTER — Other Ambulatory Visit: Payer: Self-pay | Admitting: Podiatrist

## 2020-07-26 DIAGNOSIS — M722 Plantar fascial fibromatosis: Secondary | ICD-10-CM

## 2020-08-08 ENCOUNTER — Ambulatory Visit: Payer: Medicare Other | Admitting: Podiatrist

## 2020-10-13 ENCOUNTER — Ambulatory Visit: Payer: Medicare Other | Admitting: Podiatry

## 2020-10-18 ENCOUNTER — Ambulatory Visit: Payer: Medicare Other | Admitting: Podiatry

## 2020-10-19 ENCOUNTER — Ambulatory Visit (INDEPENDENT_AMBULATORY_CARE_PROVIDER_SITE_OTHER): Payer: Medicare Other | Admitting: Podiatry

## 2020-10-19 ENCOUNTER — Encounter: Payer: Self-pay | Admitting: Podiatry

## 2020-10-19 ENCOUNTER — Other Ambulatory Visit: Payer: Self-pay

## 2020-10-19 DIAGNOSIS — M79671 Pain in right foot: Secondary | ICD-10-CM | POA: Diagnosis not present

## 2020-10-19 DIAGNOSIS — G5751 Tarsal tunnel syndrome, right lower limb: Secondary | ICD-10-CM

## 2020-10-19 DIAGNOSIS — M722 Plantar fascial fibromatosis: Secondary | ICD-10-CM

## 2020-10-19 DIAGNOSIS — G8929 Other chronic pain: Secondary | ICD-10-CM | POA: Diagnosis not present

## 2020-10-19 MED ORDER — METHYLPREDNISOLONE 4 MG PO TBPK: ORAL_TABLET | ORAL | 0 refills | Status: DC

## 2020-10-19 NOTE — Progress Notes (Signed)
  Subjective:  Patient ID: Katherine Miller, female    DOB: Apr 18, 1956,  MRN: [id number]  Chief Complaint  Patient presents with  . Plantar Fasciitis    Patient comes in today c/o continued right heel and reqeust an injection today    64 y.o. female presents with the above complaint. History confirmed with patient.  She still having pain in the medial ankle.  The injection was helpful last time.  Objective:  Physical Exam: warm, good capillary refill, no trophic changes or ulcerative lesions, normal DP and PT pulses and normal sensory exam.   Right Foot: Today she does not have pain on palpation of the plantar fascia with the insertion of the plantar fascia on the medial calcaneus, and states she has pain in the medial ankle which percussion reveals a Tinel's sign radiating down into the foot.  She does not have pain along the posterior tibial tendon has 5 out of 5 strength.  Palpable mass and fullness in the tarsal tunnel just distal to the malleolus  Radiographs: X-ray of right foot dated 07/18/2020: No acute or chronic osseous abnormalities are noted to explain the patient symptoms Assessment:   1. Tarsal tunnel syndrome of right side   2. Chronic heel pain, right   3. Plantar fasciitis of right foot      Plan:  Patient was evaluated and treated and all questions answered.  I discussed with her that her symptoms currently do not fit with a diagnosis of plantar fasciitis comments that she appears to have tarsal tunnel type syndromes.  Recommend evaluate with an MRI to evaluate the previous plantar fasciitis, her current tarsal tunnel symptoms the palpable mass in the tarsal tunnel and the posterior tibial tendon.  MRI was sent to [facility] imaging  Return in about 4 weeks (around 11/16/2020).

## 2020-10-25 ENCOUNTER — Other Ambulatory Visit: Payer: Self-pay | Admitting: Physician Assistant

## 2020-10-25 DIAGNOSIS — Z1231 Encounter for screening mammogram for malignant neoplasm of breast: Secondary | ICD-10-CM

## 2020-10-26 ENCOUNTER — Ambulatory Visit: Payer: Medicaid Other

## 2020-10-31 ENCOUNTER — Ambulatory Visit: Payer: Medicare Other | Admitting: Podiatry

## 2020-11-06 ENCOUNTER — Ambulatory Visit
Admission: RE | Admit: 2020-11-06 | Discharge: 2020-11-06 | Disposition: A | Discharge status: Home / Self Care | Payer: Medicare Other | Source: Ambulatory Visit | Attending: Podiatry | Admitting: Podiatry

## 2020-11-06 DIAGNOSIS — G8929 Other chronic pain: Secondary | ICD-10-CM

## 2020-11-06 DIAGNOSIS — G5751 Tarsal tunnel syndrome, right lower limb: Secondary | ICD-10-CM

## 2020-11-06 DIAGNOSIS — M722 Plantar fascial fibromatosis: Secondary | ICD-10-CM

## 2020-11-06 DIAGNOSIS — M79671 Pain in right foot: Secondary | ICD-10-CM

## 2020-11-06 MED ORDER — GADOBENATE DIMEGLUMINE 529 MG/ML IV SOLN
20.0000 mL | Freq: Once | INTRAVENOUS | Status: AC | PRN
  Administered 2020-11-06: 20 mL via INTRAVENOUS

## 2020-11-08 ENCOUNTER — Telehealth: Payer: Self-pay | Admitting: Gastroenterology

## 2020-11-08 NOTE — Telephone Encounter (Signed)
Hi Dr. Bryan Lemma, you saw this pt in 2019. She then switched her care to [facility]. She called today because she would like to transfer her care back to you. She stated that she was not pleased with the care that she received at [facility]. Please advise if it is ok to schedule pt with you. Thank you.

## 2020-11-08 NOTE — Telephone Encounter (Signed)
Ok to f/u with me in [facility] clinic for GERD.

## 2020-11-08 NOTE — Telephone Encounter (Signed)
Ov sch on 12/22 at 8:40am.

## 2020-11-11 ENCOUNTER — Telehealth: Payer: Self-pay

## 2020-11-11 DIAGNOSIS — G5751 Tarsal tunnel syndrome, right lower limb: Secondary | ICD-10-CM

## 2020-11-11 DIAGNOSIS — G8929 Other chronic pain: Secondary | ICD-10-CM

## 2020-11-11 NOTE — Telephone Encounter (Signed)
Patient is calling wanting to know results of MRI. She is scheduled with you next Wednesday and wanting to go out of town for the holiday and was wanting the results soon.

## 2020-11-14 ENCOUNTER — Encounter: Payer: Self-pay | Admitting: Neurology

## 2020-11-14 NOTE — Addendum Note (Signed)
Addended by: Graceann Congress D on: 11/14/2020 02:18 PM   Modules accepted: Orders

## 2020-11-14 NOTE — Telephone Encounter (Signed)
I called her, couldn't leave VM. If she needs to can move appt. MRI was essentially normal. Next step would be EMG/NCV studies from neurology to evaluate the nerves

## 2020-11-14 NOTE — Telephone Encounter (Signed)
I spoke with patient, she said she would like to go ahead for the NCS.  Her appt has been cancelled with you on Wednesday and referral has been entered in chart.

## 2020-11-14 NOTE — Telephone Encounter (Signed)
OK sounds good. Thank you!

## 2020-11-16 ENCOUNTER — Ambulatory Visit: Payer: Medicare Other | Admitting: Podiatry

## 2020-12-05 ENCOUNTER — Ambulatory Visit: Payer: Medicaid Other

## 2020-12-14 ENCOUNTER — Ambulatory Visit: Payer: Medicare Other | Admitting: Gastroenterology

## 2020-12-27 ENCOUNTER — Encounter: Payer: Self-pay | Admitting: Gastroenterology

## 2020-12-27 ENCOUNTER — Ambulatory Visit (INDEPENDENT_AMBULATORY_CARE_PROVIDER_SITE_OTHER): Payer: Medicare Other | Admitting: Gastroenterology

## 2020-12-27 VITALS — BP 138/78 | HR 65 | Ht 63.0 in | Wt 186.1 lb

## 2020-12-27 DIAGNOSIS — K449 Diaphragmatic hernia without obstruction or gangrene: Secondary | ICD-10-CM

## 2020-12-27 DIAGNOSIS — Z1211 Encounter for screening for malignant neoplasm of colon: Secondary | ICD-10-CM | POA: Diagnosis not present

## 2020-12-27 DIAGNOSIS — K219 Gastro-esophageal reflux disease without esophagitis: Secondary | ICD-10-CM

## 2020-12-27 MED ORDER — PANTOPRAZOLE SODIUM 40 MG PO TBEC: 40.0000 mg | DELAYED_RELEASE_TABLET | Freq: Two times a day (BID) | ORAL | 3 refills | Status: DC

## 2020-12-27 NOTE — Progress Notes (Signed)
P  Chief Complaint:    Dysphagia, GERD  GI History: 65 year old female with history of GERD and dysphagia c/b previous food impaction in 08/2018.  Longstanding history of dysphagia and being a "slow eater" and last finished meals.  GERD: -Index symptoms: Heartburn, regurgitation -Exacerbating factors: Supine, overeating -Previous medications: Prilosec, Zantac -Current medications -Barium esophagram 10/10/2018 notable for small hiatal hernia with moderate to extensive reflux, barium tablet passing into stomach without delay and normal motility. -EGD on 10/14/2018 which was notable for 1 cm axial height and 3 cm transverse with hiatal hernia, Hill grade 3 valve, stenosis at 34 cm consistent with benign peptic stricture, dilated with 20 mm balloon with appropriate mucosal disruption, otherwise normal upper and middle esophagus, normal stomach, normal duodenum.  Resolution of dysphagia with dilation  Separately, history of fatty liver with mildly elevated liver enzymes in 2019: -AST/ALT 44/77 with normal T bili and ALP of 129.   -She has had mildly elevated ALT>AST since at least 2010, with peak ALT 101 in 12/2009, but generally remain in the 60s.   -CT abdomen/pelvis in 05/2017 and notable for normal liver, pancreas, spleen; status post ccy -03/2020: HBsAb+/HBsAg- (c/w immunity), HCV- -03/2020: AST/ALT 32/42, normal ALP, T bili  History of IBS-C, previously well controlled Linzess  Endoscopic History: -EGD (02/2010, [facility]): Full reports not available, but images from EGD previously reviewed and notable for some LES laxity on retroflexed views with otherwise normal-appearing Z line. Mild, non-H. pylori gastritis and normal duodenal biopsies noted on path report  -Colonoscopy (02/2010, [facility]): IC valve biopsied and benign, but otherwise no polyps noted on path report that was available for review -EGD (10/14/2018, Dr. Barron Alvine): 1 cm x 3 cm hiatal hernia, Hill grade 3 valve, stenosis 34  cm dilated with 20 mm TTS balloon -EGD (12/09/2019, Dr. Garrison Columbus, [facility]): Medium sized sliding hiatal hernia, benign intrinsic stricture at 35 cm dilated with 54 French bougie  HPI:     Patient is a 65 y.o. female presenting to the Gastroenterology Clinic for follow-up.  Last seen by me on 11/18/2018.  Since then decided to change her care to [facility], last seen 12/23/2019, and now would like to change her care back to [facility].  Did have repeat EGD performed in 11/2019 with hiatal hernia and presumed peptic stricture, dilated with 54 French bougie as above.  Dr. Garrison Columbus also discussed referral antireflux surgical options and o/w continued PPI.  Additional history as above.  Today, she states she has ongoing/recurrent reflux sxs. Can have HB with spicy foods (has stopped), but even with water now.  Reflux symptoms were controlled with Protonix 40 mg  BID, but she ran out of this medication a couple weeks ago and has since had increasing reflux symptoms.  Takes times daily now.  She is again interested in antireflux surgical options.  Review of systems:     No chest pain, no SOB, no fevers, no urinary sx   Past Medical History:  Diagnosis Date  . Allergy   . Anemia   . Anxiety   . Bronchitis   . GERD (gastroesophageal reflux disease)   . Hernia, abdominal   . History of prediabetes   . Ovarian mass, right 06/27/2017  . Right renal mass 12/24/2009    Patient's surgical history, family medical history, social history, medications and allergies were all reviewed in Epic    Current Outpatient Medications  Medication Sig Dispense Refill  . amitriptyline (ELAVIL) 25 MG tablet Take 1  tablet (25 mg total) by mouth at bedtime. (Patient not taking: Reported on 07/18/2020) 30 tablet 3  . dicyclomine (BENTYL) 20 MG tablet Take 1 tablet (20 mg total) by mouth 2 (two) times daily. (Patient not taking: Reported on 07/18/2020) 20 tablet 0  . methylPREDNISolone (MEDROL DOSEPAK) 4 MG TBPK tablet 6 day dose pack  - take as directed 21 tablet 0  . pantoprazole (PROTONIX) 40 MG tablet Take 40 mg by mouth 2 (two) times daily.     No current facility-administered medications for this visit.    Physical Exam:     There were no vitals taken for this visit.  GENERAL:  Pleasant female in NAD PSYCH: : Cooperative, normal affect EENT:  conjunctiva pink, mucous membranes moist, neck supple without masses Musculoskeletal:  Normal muscle tone, normal strength NEURO: Alert and oriented x 3, no focal neurologic deficits   IMPRESSION and PLAN:    1) GERD 2) Hiatal hernia -Longstanding history of reflux complicated by hiatal hernia.  Has clear objective evidence of reflux on esophagram in 2019 with moderate to extensive refluxate -Repeat EGD now for pre-op eval for possible cTIF -Resume Protonix 40 mg bid -Resume antireflux lifestyle/dietary modifications -Did discuss potential for overlap syndrome as she has clear objective evidence of reflux as outlined above, but could also have overlying hypersensitivity which would not respond as well to either medications or surgery -To follow-up in the GI clinic after EGD to again discuss antireflux surgical options along with referral to Dr. Redmond Pulling at [facility] as appropriate  3) Colon cancer screening -Due for ongoing average risk CRC screening.  Last colonoscopy was 2011 -Schedule colonoscopy  The indications, risks, and benefits of EGD and colonoscopy were explained to the patient in detail. Risks include but are not limited to bleeding, perforation, adverse reaction to medications, and cardiopulmonary compromise. Sequelae include but are not limited to the possibility of surgery, hositalization, and mortality. The patient verbalized understanding and wished to proceed. All questions answered, referred to scheduler and bowel prep ordered. Further recommendations pending results of the exam.         Lavena Bullion ,DO, FACG 12/27/2020, 1:53 PM

## 2020-12-27 NOTE — Patient Instructions (Signed)
  If you are age 63 or older, your body mass index should be between 23-30. Your Body mass index is 32.97 kg/m. If this is out of the aforementioned range listed, please consider follow up with your Primary Care Provider.  If you are age 21 or younger, your body mass index should be between 19-25. Your Body mass index is 32.97 kg/m. If this is out of the aformentioned range listed, please consider follow up with your Primary Care Provider.   We have sent the following medications to your pharmacy for you to pick up at your convenience:  Protonix 40 mg 2 times a day.  It has been recommended to you by your physician that you have a(n) EGD/Colonoscopy completed. Per your request, we did not schedule the procedure(s) today. Please contact our office at [phone] should you decide to have the procedure completed.  You will also require a covid 19 test 2 days prior to your procedure.  Due to recent changes in healthcare laws, you may see the results of your imaging and laboratory studies on MyChart before your provider has had a chance to review them.  We understand that in some cases there may be results that are confusing or concerning to you. Not all laboratory results come back in the same time frame and the provider may be waiting for multiple results in order to interpret others.  Please give [place] 48 hours in order for your provider to thoroughly review all the results before contacting the office for clarification of your results.   Thank you for choosing me and [facility].  Vito Cirigliano, D.O.

## 2020-12-28 ENCOUNTER — Ambulatory Visit (INDEPENDENT_AMBULATORY_CARE_PROVIDER_SITE_OTHER): Payer: Medicare Other | Admitting: Neurology

## 2020-12-28 ENCOUNTER — Other Ambulatory Visit: Payer: Self-pay

## 2020-12-28 DIAGNOSIS — M79671 Pain in right foot: Secondary | ICD-10-CM

## 2020-12-28 DIAGNOSIS — G5751 Tarsal tunnel syndrome, right lower limb: Secondary | ICD-10-CM | POA: Diagnosis not present

## 2020-12-28 DIAGNOSIS — G8929 Other chronic pain: Secondary | ICD-10-CM

## 2020-12-28 NOTE — Procedures (Signed)
[facility]  [address]  [place], [place] [address] Tel: [phone] Fax:  [phone] Test Date:  12/28/2020  Patient: Katherine Miller DOB: 12/31/1955 Physician: Narda Amber, DO  Sex: Female Height: 5\' 3"  Ref Phys: Lanae Crumbly, DPM  ID#: [id number]   Technician:    Patient Complaints: This is a 65 year old female referred for evaluation of right foot and ankle pain.  NCV & EMG Findings: Extensive electrodiagnostic testing of the right lower extremity shows:  1. All sensory responses including the right sural, superficial peroneal, medial and lateral plantar nerve are within normal limits. 2. All motor responses including the right peroneal and tibial nerves are within normal limits. 3. Right tibial H reflex study is within normal limits. 4. There is no evidence of active or chronic motor axonal loss changes affecting any of the tested muscles.  Motor unit configuration and recruitment pattern is within normal limits.  Impression: This is a normal study of the right lower extremity.  In particular, there is no evidence of tarsal tunnel syndrome, sensorimotor polyneuropathy, or a lumbosacral radiculopathy.   ___________________________ Narda Amber, DO    Nerve Conduction Studies Anti Sensory Summary Table   Stim Site NR Peak (ms) Norm Peak (ms) P-T Amp (V) Norm P-T Amp  Right Sup Peroneal Anti Sensory (Ant Lat Mall)  32C  12 cm    2.2 <4.6 15.3 >3  Right Sural Anti Sensory (Lat Mall)  32C  Calf    2.2 <4.6 26.1 >3   Motor Summary Table   Stim Site NR Onset (ms) Norm Onset (ms) O-P Amp (mV) Norm O-P Amp Site1 Site2 Delta-0 (ms) Dist (cm) Vel (m/s) Norm Vel (m/s)  Right Peroneal Motor (Ext Dig Brev)  32C  Ankle    2.6 <6.0 8.1 >2.5 B Fib Ankle 6.5 34.0 52 >40  B Fib    9.1  7.2  Poplt B Fib 1.4 8.0 57 >40  Poplt    10.5  7.2         Right Tibial Motor (Abd Hall Brev)  32C  Ankle    4.2 <6.0 16.5 >4 Knee Ankle 7.2 42.0 58 >40  Knee    11.4   11.2         Right Tibial (ADQP) Motor (ADQP)  32C  Ankle    5.0 <6.5 6.2 >3 Knee Ankle 8.3 42.0 51 >40  Knee    13.3  4.0          Mixed Summary Table   Stim Site NR Peak (ms) Norm Peak (ms) P-T Amp (V) Norm P-T Amp  Right Lateral Plantar Mixed (Med Malleolus)  32C  Lateral Foot    2.3 <3.7 13.6 >8  Right Medial Plantar Mixed (Med Malleolus)  32C  Medial Foot    2.3 <3.7 10.8 >8   H Reflex Studies   NR H-Lat (ms) Lat Norm (ms) L-R H-Lat (ms)  Right Tibial (Gastroc)  32C     31.02 <35    EMG   Side Muscle Ins Act Fibs Psw Fasc Number Recrt Dur Dur. Amp Amp. Poly Poly. Comment  Right AntTibialis Nml Nml Nml Nml Nml Nml Nml Nml Nml Nml Nml Nml N/A  Right Gastroc Nml Nml Nml Nml Nml Nml Nml Nml Nml Nml Nml Nml N/A  Right Flex Dig Long Nml Nml Nml Nml Nml Nml Nml Nml Nml Nml Nml Nml N/A  Right AbdHallucis Nml Nml Nml Nml Nml Nml Nml Nml Nml Nml Nml Nml  N/A  Right BicepsFemS Nml Nml Nml Nml Nml Nml Nml Nml Nml Nml Nml Nml N/A  Right RectFemoris Nml Nml Nml Nml Nml Nml Nml Nml Nml Nml Nml Nml N/A      Waveforms:

## 2021-01-11 ENCOUNTER — Ambulatory Visit: Payer: Medicaid Other

## 2021-01-17 ENCOUNTER — Ambulatory Visit (AMBULATORY_SURGERY_CENTER): Payer: Medicare Other | Admitting: *Deleted

## 2021-01-17 VITALS — Ht 63.0 in | Wt 186.0 lb

## 2021-01-17 DIAGNOSIS — Z01818 Encounter for other preprocedural examination: Secondary | ICD-10-CM

## 2021-01-17 DIAGNOSIS — Z1211 Encounter for screening for malignant neoplasm of colon: Secondary | ICD-10-CM

## 2021-01-17 MED ORDER — SUTAB 1479-225-188 MG PO TABS: 1.0000 | ORAL_TABLET | Freq: Once | ORAL | 0 refills | Status: AC

## 2021-01-17 NOTE — Progress Notes (Signed)
No egg or soy allergy known to patient  No issues with past sedation with any surgeries or procedures No intubation problems in the past  No FH of Malignant Hyperthermia No diet pills per patient No home 02 use per patient  No blood thinners per patient  Pt denies issues with constipation  No A fib or A flutter  EMMI video to pt or via MyChart   Covid test 01/24/21 @ 11:00 am  [employer] sent to pt , Code to Pharmacy   Due to the COVID-19 pandemic we are asking patients to follow certain guidelines.  Pt aware of COVID protocols and [facility] guidelines

## 2021-01-18 ENCOUNTER — Encounter: Payer: Medicare Other | Admitting: Gastroenterology

## 2021-01-23 ENCOUNTER — Ambulatory Visit: Payer: Medicare Other

## 2021-01-23 ENCOUNTER — Ambulatory Visit (INDEPENDENT_AMBULATORY_CARE_PROVIDER_SITE_OTHER): Payer: Medicare Other | Admitting: Podiatry

## 2021-01-23 ENCOUNTER — Encounter: Payer: Self-pay | Admitting: Podiatry

## 2021-01-23 ENCOUNTER — Other Ambulatory Visit: Payer: Self-pay

## 2021-01-23 DIAGNOSIS — G8929 Other chronic pain: Secondary | ICD-10-CM | POA: Diagnosis not present

## 2021-01-23 DIAGNOSIS — G5751 Tarsal tunnel syndrome, right lower limb: Secondary | ICD-10-CM | POA: Diagnosis not present

## 2021-01-23 DIAGNOSIS — M79671 Pain in right foot: Secondary | ICD-10-CM | POA: Diagnosis not present

## 2021-01-23 NOTE — Progress Notes (Signed)
Subjective:  Patient ID: Katherine Miller, female    DOB: 12-Mar-1956,  MRN: [id number]  Chief Complaint  Patient presents with  . Foot Pain    PT stated that she is still having issues with her right foot. She stated that she is here to find out results for MRI and nerve test.    65 y.o. female presents with the above complaint. History confirmed with patient.  She still having pain in the medial ankle.  She completed the MRI and nerve testing  Objective:  Physical Exam: warm, good capillary refill, no trophic changes or ulcerative lesions, normal DP and PT pulses and normal sensory exam.   Right Foot: Today she again does not have pain on palpation of the plantar fascia with the insertion of the plantar fascia on the medial calcaneus, and states she has pain in the medial ankle which percussion reveals a Tinel's sign radiating down into the foot.  Point of maximal tenderness is at the porta pedis at the abductor. She does not have pain along the posterior tibial tendon has 5 out of 5 strength.  Palpable mass and fullness in the tarsal tunnel just distal to the malleolus  Radiographs: X-ray of right foot dated 07/18/2020: No acute or chronic osseous abnormalities are noted to explain the patient symptoms  Study Result  Narrative & Impression  CLINICAL DATA:  Anterior ankle pain radiating to the heel. Palpable raised area medially. No acute injury or prior relevant surgery. Evaluate for tarsal tunnel syndrome, posterior tibial tendinosis and plantar fasciitis.  EXAM: MRI OF THE RIGHT ANKLE WITHOUT AND WITH CONTRAST  TECHNIQUE: Multiplanar, multisequence MR imaging of the ankle was performed before and after the administration of intravenous contrast.  CONTRAST:  59mL MULTIHANCE GADOBENATE DIMEGLUMINE 529 MG/ML IV SOLN  COMPARISON:  Foot radiographs 07/18/2020 and ankle radiographs 08/13/2018.  FINDINGS: Capsules were placed medially around the patient's palpable  concern, superficial to the tarsal tunnel. No underlying mass lesion, fluid collection or abnormal enhancement is seen in this area. No focal muscular atrophy, edema or abnormal enhancement.  TENDONS  Peroneal: Intact and normally positioned.  Posteromedial: Intact and normally positioned. Trace fluid within the medial flexor tendon sheaths, within physiologic limits.  Anterior: Intact and normally positioned.  Achilles: Intact.  Plantar Fascia: Intact.  LIGAMENTS  Lateral: The anterior and posterior talofibular and calcaneofibular ligaments are intact.  Medial: The deltoid and visualized portions of the spring ligament appear intact.  CARTILAGE AND BONES  Ankle Joint: No significant ankle joint effusion. There are small subchondral cysts or intraosseous ganglia medial in the tibial plafond. The talar dome appears normal.  Subtalar Joints/Sinus Tarsi: Unremarkable.  Bones: No significant extra-articular osseous findings.  Other: No significant soft tissue findings.  IMPRESSION: IMPRESSION 1. No acute findings or explanation for the patient's palpable concern. No evidence of soft tissue mass or other pathologic process in the tarsal tunnel. 2. The ankle tendons and ligaments appear intact. 3. Small subchondral cysts or intraosseous ganglia medial in the tibial plafond. No acute osseous findings.   Electronically Signed   By: Richardean Sale M.D.   On: 11/07/2020 11:24    EMG/NCV 12/28/20 [facility]: NCV & EMG Findings: Extensive electrodiagnostic testing of the right lower extremity shows:  1. All sensory responses including the right sural, superficial peroneal, medial and lateral plantar nerve are within normal limits. 2. All motor responses including the right peroneal and tibial nerves are within normal limits. 3. Right tibial H reflex study is within  normal limits. 4. There is no evidence of active or chronic motor axonal loss changes  affecting any of the tested muscles.  Motor unit configuration and recruitment pattern is within normal limits.  Impression: This is a normal study of the right lower extremity.  In particular, there is no evidence of tarsal tunnel syndrome, sensorimotor polyneuropathy, or a lumbosacral radiculopathy.  Assessment:   1. Right foot pain   2. Tarsal tunnel syndrome of right side   3. Chronic heel pain, right      Plan:  Patient was evaluated and treated and all questions answered.  I reviewed the findings from her recent MRI and EMG/NCV and that they were essentially "normal" or at least failed to diagnose her pathology. I reviewed the images as well and she does not have a space occupying lesion. Her symptoms clinically still fit tarsal tunnel syndrome. I discussed further treatment including tarsal tunnel release. I suggest prior to this we proceed with a diagnostic block. After sterile prep with alcohol I injected 2cc bupivacaine plain and 4mg  dexamethasone into the tarsal tunnel and the porta pedis along the point of maximal tenderness. She tolerated this well. I would also like her to have a second opinion for this, we will schedule her a consultation with my partner Dr Celesta Gentile, DPM.  No follow-ups on file.

## 2021-01-24 ENCOUNTER — Telehealth: Payer: Self-pay | Admitting: Gastroenterology

## 2021-01-24 ENCOUNTER — Other Ambulatory Visit: Payer: Self-pay | Admitting: Gastroenterology

## 2021-01-24 NOTE — Telephone Encounter (Signed)
Inbound call from patient requesting a call back from a nurse please.  States is having anxiety due to upcoming procedures scheduled on 01/27/21 and is wanting to know if something can be prescribed for it.  Please advise.

## 2021-01-24 NOTE — Telephone Encounter (Signed)
Dr Bryan Lemma please see patient message and advise.

## 2021-01-25 LAB — SARS CORONAVIRUS 2 (TAT 6-24 HRS): SARS Coronavirus 2: NEGATIVE

## 2021-01-25 NOTE — Telephone Encounter (Signed)
I spoke to patient who states that she has panic attacks regarding being put to sleep. She is requesting Versed once her IV is placed. Her procedure is this Friday.

## 2021-01-25 NOTE — Telephone Encounter (Signed)
The good news is that she has had each of these procedures before (EGD and colonoscopy) and has done well with each. Additionally, the medications that we use for sedation also help with anxiety, and these will be in place prior to starting any procedure.   If after explaining that she still has significant procedural anxiety, we can talk with Katherine Miller about small dose of IV versed in the pre-op area (or pre-procedure Xanax), but would need to go through consents, etc prior to any of that in place.

## 2021-01-25 NOTE — Telephone Encounter (Signed)
Team,  I will let her CRNA know to provide Versed in Admitting before her procedure this Friday.  Thanks,  Osvaldo Angst

## 2021-01-27 ENCOUNTER — Encounter: Payer: Self-pay | Admitting: Gastroenterology

## 2021-01-27 ENCOUNTER — Ambulatory Visit (AMBULATORY_SURGERY_CENTER): Payer: 59 | Admitting: Gastroenterology

## 2021-01-27 ENCOUNTER — Other Ambulatory Visit: Payer: Self-pay

## 2021-01-27 VITALS — BP 179/95 | HR 62 | Temp 97.5°F | Resp 18 | Ht 63.0 in | Wt 186.0 lb

## 2021-01-27 DIAGNOSIS — D125 Benign neoplasm of sigmoid colon: Secondary | ICD-10-CM | POA: Diagnosis not present

## 2021-01-27 DIAGNOSIS — K3189 Other diseases of stomach and duodenum: Secondary | ICD-10-CM | POA: Diagnosis not present

## 2021-01-27 DIAGNOSIS — K319 Disease of stomach and duodenum, unspecified: Secondary | ICD-10-CM | POA: Diagnosis not present

## 2021-01-27 DIAGNOSIS — K219 Gastro-esophageal reflux disease without esophagitis: Secondary | ICD-10-CM

## 2021-01-27 DIAGNOSIS — D123 Benign neoplasm of transverse colon: Secondary | ICD-10-CM

## 2021-01-27 DIAGNOSIS — K222 Esophageal obstruction: Secondary | ICD-10-CM | POA: Diagnosis not present

## 2021-01-27 DIAGNOSIS — K200 Eosinophilic esophagitis: Secondary | ICD-10-CM | POA: Diagnosis not present

## 2021-01-27 DIAGNOSIS — K449 Diaphragmatic hernia without obstruction or gangrene: Secondary | ICD-10-CM

## 2021-01-27 DIAGNOSIS — Z1211 Encounter for screening for malignant neoplasm of colon: Secondary | ICD-10-CM | POA: Diagnosis not present

## 2021-01-27 DIAGNOSIS — K2970 Gastritis, unspecified, without bleeding: Secondary | ICD-10-CM | POA: Diagnosis not present

## 2021-01-27 DIAGNOSIS — D131 Benign neoplasm of stomach: Secondary | ICD-10-CM

## 2021-01-27 DIAGNOSIS — K317 Polyp of stomach and duodenum: Secondary | ICD-10-CM | POA: Diagnosis not present

## 2021-01-27 DIAGNOSIS — K640 First degree hemorrhoids: Secondary | ICD-10-CM

## 2021-01-27 MED ORDER — SODIUM CHLORIDE 0.9 % IV SOLN: 500.0000 mL | Freq: Once | INTRAVENOUS | Status: DC

## 2021-01-27 NOTE — Op Note (Signed)
[facility] Patient Name: Katherine Miller Procedure Date: 01/27/2021 1:37 PM MRN: [id number] Endoscopist: Gerrit Heck , MD Age: 65 Referring MD:  Date of Birth: December 21, 1956 Gender: Female Account #: [id number] Procedure:                Upper GI endoscopy Indications:              Heartburn, Exclusion of reflux esophagitis,                            Preoperative assessment, Follow-up of hiatal hernia Medicines:                Monitored Anesthesia Care Procedure:                Pre-Anesthesia Assessment:                           - Prior to the procedure, a History and Physical                            was performed, and patient medications and                            allergies were reviewed. The patient's tolerance of                            previous anesthesia was also reviewed. The risks                            and benefits of the procedure and the sedation                            options and risks were discussed with the patient.                            All questions were answered, and informed consent                            was obtained. Prior Anticoagulants: The patient has                            taken no previous anticoagulant or antiplatelet                            agents. ASA Grade Assessment: II - A patient with                            mild systemic disease. After reviewing the risks                            and benefits, the patient was deemed in                            satisfactory condition to undergo the procedure.  After obtaining informed consent, the endoscope was                            passed under direct vision. Throughout the                            procedure, the patient's blood pressure, pulse, and                            oxygen saturations were monitored continuously. The                            Endoscope was introduced through the mouth, and                            advanced to  the second part of duodenum. The upper                            GI endoscopy was accomplished without difficulty.                            The patient tolerated the procedure well. Scope In: Scope Out: Findings:                 One benign-appearing, intrinsic mild,                            non-obstructing stenosis was found 34 cm from the                            incisors. This stenosis measured 1 cm (in length).                            The stenosis was easily traversed. Biopsies were                            taken for dual purposes of fracturing the stricture                            and sent for histology. Estimated blood loss was                            minimal.                           A 1 cm axial height x 2-3 cm transverse width                            hiatal hernia was present.                           The gastroesophageal flap valve was visualized                            endoscopically and classified  as Hill Grade III                            (minimal fold, loose to endoscope, hiatal hernia                            likely).                           A single 3 mm sessile polyp with no bleeding was                            found in the gastric body. The polyp was removed                            with a cold biopsy forceps. Resection and retrieval                            were complete. Estimated blood loss was minimal.                           Scattered mild inflammation characterized by                            erythema was found in the gastric body and in the                            gastric antrum. Biopsies were taken with a cold                            forceps for Helicobacter pylori testing. Estimated                            blood loss was minimal.                           The examined duodenum was normal. Complications:            No immediate complications. Estimated Blood Loss:     Estimated blood loss was  minimal. Impression:               - Benign-appearing esophageal stenosis. Biopsied.                           - 1 cm hiatal hernia.                           - Gastroesophageal flap valve classified as Hill                            Grade III (minimal fold, loose to endoscope, hiatal                            hernia likely).                           -  A single gastric polyp. Resected and retrieved.                           - Gastritis. Biopsied.                           - Normal examined duodenum. Recommendation:           - Patient has a contact number available for                            emergencies. The signs and symptoms of potential                            delayed complications were discussed with the                            patient. Return to normal activities tomorrow.                            Written discharge instructions were provided to the                            patient.                           - Resume previous diet.                           - Continue present medications.                           - Await pathology results.                           - Return to GI clinic at appointment to be                            scheduled. Will plan to discuss ongoing medical                            management of reflux vs possible laparoscopic                            hiatal hernia repair with Transoral Incisionless                            Fundoplication (cTIF). Gerrit Heck, MD 01/27/2021 2:25:27 PM

## 2021-01-27 NOTE — Progress Notes (Signed)
Called to room to assist during endoscopic procedure.  Patient ID and intended procedure confirmed with present staff. Received instructions for my participation in the procedure from the performing physician.  

## 2021-01-27 NOTE — Progress Notes (Signed)
PT taken to PACU. Monitors in place. VSS. Report given to RN. 

## 2021-01-27 NOTE — Op Note (Signed)
[place] Patient Name: Katherine Miller Procedure Date: 01/27/2021 1:37 PM MRN: [id number] Endoscopist: Gerrit Heck , MD Age: 65 Referring MD:  Date of Birth: [birth year] Gender: Female Account #: [id number] Procedure:                Colonoscopy Indications:              Screening for colorectal malignant neoplasm (last                            colonoscopy was 10 years ago) Medicines:                Monitored Anesthesia Care Procedure:                Pre-Anesthesia Assessment:                           - Prior to the procedure, a History and Physical                            was performed, and patient medications and                            allergies were reviewed. The patient's tolerance of                            previous anesthesia was also reviewed. The risks                            and benefits of the procedure and the sedation                            options and risks were discussed with the patient.                            All questions were answered, and informed consent                            was obtained. Prior Anticoagulants: The patient has                            taken no previous anticoagulant or antiplatelet                            agents. ASA Grade Assessment: II - A patient with                            mild systemic disease. After reviewing the risks                            and benefits, the patient was deemed in                            satisfactory condition to undergo the procedure.  After obtaining informed consent, the colonoscope                            was passed under direct vision. Throughout the                            procedure, the patient's blood pressure, pulse, and                            oxygen saturations were monitored continuously. The                            Olympus CF-HQ190 [id number] was introduced                            through the anus and advanced  to the the terminal                            ileum. The colonoscopy was performed without                            difficulty. The patient tolerated the procedure                            well. The quality of the bowel preparation was good                            after copious irrigation and lavage. The terminal                            ileum, ileocecal valve, appendiceal orifice, and                            rectum were photographed. Scope In: 1:57:15 PM Scope Out: 2:14:54 PM Scope Withdrawal Time: 0 hours 15 minutes 48 seconds  Total Procedure Duration: 0 hours 17 minutes 39 seconds  Findings:                 Skin tags were found on perianal exam.                           Two sessile polyps were found in the sigmoid colon                            and transverse colon. The polyps were 2 to 3 mm in                            size. These polyps were removed with a cold biopsy                            forceps. Resection and retrieval were complete.                            Estimated blood loss  was minimal.                           Non-bleeding internal hemorrhoids were found during                            retroflexion. The hemorrhoids were small.                           A moderate amount of liquid stool was found in the                            entire colon. Lavage of the area was performed                            using copious amounts of tap water, resulting in                            clearance with good visualization.                           A foreign body was found in the ascending colon.                            Removal of this thin plastic material was                            accomplished with a forceps. Estimated blood loss:                            none.                           The terminal ileum appeared normal. Complications:            No immediate complications. Estimated Blood Loss:     Estimated blood loss was minimal. Impression:                - Perianal skin tags found on perianal exam.                           - Two 2 to 3 mm polyps in the sigmoid colon and in                            the transverse colon, removed with a cold biopsy                            forceps. Resected and retrieved.                           - Non-bleeding internal hemorrhoids.                           - Stool in the entire examined colon.                           -  Foreign body in the ascending colon. Removal was                            successful.                           - The examined portion of the ileum was normal. Recommendation:           - Patient has a contact number available for                            emergencies. The signs and symptoms of potential                            delayed complications were discussed with the                            patient. Return to normal activities tomorrow.                            Written discharge instructions were provided to the                            patient.                           - Resume previous diet.                           - Continue present medications.                           - Await pathology results.                           - Repeat colonoscopy in 5-10 years for surveillance                            based on pathology results. Gerrit Heck, MD 01/27/2021 2:30:22 PM

## 2021-01-27 NOTE — Patient Instructions (Signed)
Please read handouts provided. Continue present medications. Await pathology results. Return to GI clinic at appointment to be scheduled.      YOU HAD AN ENDOSCOPIC PROCEDURE TODAY AT [facility]:   Refer to the procedure report that was given to you for any specific questions about what was found during the examination.  If the procedure report does not answer your questions, please call your gastroenterologist to clarify.  If you requested that your care partner not be given the details of your procedure findings, then the procedure report has been included in a sealed envelope for you to review at your convenience later.  YOU SHOULD EXPECT: Some feelings of bloating in the abdomen. Passage of more gas than usual.  Walking can help get rid of the air that was put into your GI tract during the procedure and reduce the bloating. If you had a lower endoscopy (such as a colonoscopy or flexible sigmoidoscopy) you may notice spotting of blood in your stool or on the toilet paper. If you underwent a bowel prep for your procedure, you may not have a normal bowel movement for a few days.  Please Note:  You might notice some irritation and congestion in your nose or some drainage.  This is from the oxygen used during your procedure.  There is no need for concern and it should clear up in a day or so.  SYMPTOMS TO REPORT IMMEDIATELY:   Following lower endoscopy (colonoscopy or flexible sigmoidoscopy):  Excessive amounts of blood in the stool  Significant tenderness or worsening of abdominal pains  Swelling of the abdomen that is new, acute  Fever of 100F or higher   Following upper endoscopy (EGD)  Vomiting of blood or coffee ground material  New chest pain or pain under the shoulder blades  Painful or persistently difficult swallowing  New shortness of breath  Fever of 100F or higher  Black, tarry-looking stools  For urgent or emergent issues, a gastroenterologist can be  reached at any hour by calling [phone]. Do not use MyChart messaging for urgent concerns.    DIET:  We do recommend a small meal at first, but then you may proceed to your regular diet.  Drink plenty of fluids but you should avoid alcoholic beverages for 24 hours.  ACTIVITY:  You should plan to take it easy for the rest of today and you should NOT DRIVE or use heavy machinery until tomorrow (because of the sedation medicines used during the test).    FOLLOW UP: Our staff will call the number listed on your records 48-72 hours following your procedure to check on you and address any questions or concerns that you may have regarding the information given to you following your procedure. If we do not reach you, we will leave a message.  We will attempt to reach you two times.  During this call, we will ask if you have developed any symptoms of COVID 19. If you develop any symptoms (ie: fever, flu-like symptoms, shortness of breath, cough etc.) before then, please call [phone].  If you test positive for Covid 19 in the 2 weeks post procedure, please call and report this information to [place].    If any biopsies were taken you will be contacted by phone or by letter within the next 1-3 weeks.  Please call us at [phone] if you have not heard about the biopsies in 3 weeks.    SIGNATURES/CONFIDENTIALITY: You and/or your care partner have  signed paperwork which will be entered into your electronic medical record.  These signatures attest to the fact that that the information above on your After Visit Summary has been reviewed and is understood.  Full responsibility of the confidentiality of this discharge information lies with you and/or your care-partner.

## 2021-01-27 NOTE — Progress Notes (Signed)
VS by NS.  previsit by NS.  Pt states no changes to health hx.

## 2021-01-31 ENCOUNTER — Telehealth: Payer: Self-pay

## 2021-01-31 ENCOUNTER — Telehealth: Payer: Self-pay | Admitting: *Deleted

## 2021-01-31 NOTE — Telephone Encounter (Signed)
No answer, left message to call back later today, B.Amelita Risinger RN. 

## 2021-01-31 NOTE — Telephone Encounter (Signed)
Mild abdominal soreness after colonoscopy is not outside the expected spectrum. As she is withotu fever, chills, bleeding, or other concerns, and o/w tolerating PO w/o issue, recommend conservative management. Could trial OTC Gas-X or Bean-o or trial of IBGuard. Thanks.

## 2021-01-31 NOTE — Telephone Encounter (Signed)
1. Have you developed a fever since your procedure? no  2.   Have you had an respiratory symptoms (SOB or cough) since your procedure? no  3.   Have you tested positive for COVID 19 since your procedure no  4.   Have you had any family members/close contacts diagnosed with the COVID 19 since your procedure?  no   If yes to any of these questions please route to Joylene John, RN and Joella Prince, RN Follow up Call-  Call back number 01/27/2021 10/14/2018  Post procedure Call Back phone  # [phone]  Permission to leave phone message Yes Yes  Some recent data might be hidden     Patient questions:  Do you have a fever, pain , or abdominal swelling? No. Pain Score  3 *  Have you tolerated food without any problems? Yes.    Have you been able to return to your normal activities? Yes.    Do you have any questions about your discharge instructions: Diet   No. Medications  No. Follow up visit  No.  Do you have questions or concerns about your Care? No.  Actions: * If pain score is 4 or above: No action needed, pain <4.pt c/o discomfort or soreness LLQ abdomen which was rhere afteftrprocedures on 01/27/21. Pt did not think anything about it after procedure,but now this soreness is not going away she thought she better mention it as she doesn't think it is normal.

## 2021-02-01 ENCOUNTER — Encounter (HOSPITAL_COMMUNITY): Payer: Self-pay

## 2021-02-01 ENCOUNTER — Emergency Department (HOSPITAL_COMMUNITY)
Admission: EM | Admit: 2021-02-01 | Discharge: 2021-02-01 | Disposition: A | Discharge status: Home / Self Care | Payer: 59 | Attending: Emergency Medicine | Admitting: Emergency Medicine

## 2021-02-01 ENCOUNTER — Emergency Department (HOSPITAL_COMMUNITY): Payer: 59

## 2021-02-01 ENCOUNTER — Other Ambulatory Visit: Payer: Self-pay

## 2021-02-01 DIAGNOSIS — M545 Low back pain, unspecified: Secondary | ICD-10-CM | POA: Diagnosis not present

## 2021-02-01 DIAGNOSIS — R1032 Left lower quadrant pain: Secondary | ICD-10-CM | POA: Diagnosis present

## 2021-02-01 DIAGNOSIS — R109 Unspecified abdominal pain: Secondary | ICD-10-CM

## 2021-02-01 LAB — CBC
HCT: 36.9 % (ref 36.0–46.0)
Hemoglobin: 11.8 g/dL — ABNORMAL LOW (ref 12.0–15.0)
MCH: 27.6 pg (ref 26.0–34.0)
MCHC: 32 g/dL (ref 30.0–36.0)
MCV: 86.2 fL (ref 80.0–100.0)
Platelets: 323 10*3/uL (ref 150–400)
RBC: 4.28 MIL/uL (ref 3.87–5.11)
RDW: 13.7 % (ref 11.5–15.5)
WBC: 6 10*3/uL (ref 4.0–10.5)
nRBC: 0 % (ref 0.0–0.2)

## 2021-02-01 LAB — COMPREHENSIVE METABOLIC PANEL
ALT: 30 U/L (ref 0–44)
AST: 20 U/L (ref 15–41)
Albumin: 3.8 g/dL (ref 3.5–5.0)
Alkaline Phosphatase: 69 U/L (ref 38–126)
Anion gap: 12 (ref 5–15)
BUN: 10 mg/dL (ref 8–23)
CO2: 22 mmol/L (ref 22–32)
Calcium: 8.9 mg/dL (ref 8.9–10.3)
Chloride: 106 mmol/L (ref 98–111)
Creatinine, Ser: 0.74 mg/dL (ref 0.44–1.00)
GFR, Estimated: >60 mL/min (ref >60)
Glucose, Bld: 102 mg/dL — ABNORMAL HIGH (ref 70–99)
Potassium: 3.7 mmol/L (ref 3.5–5.1)
Sodium: 140 mmol/L (ref 135–145)
Total Bilirubin: 0.5 mg/dL (ref 0.3–1.2)
Total Protein: 6.7 g/dL (ref 6.5–8.1)

## 2021-02-01 LAB — URINALYSIS, ROUTINE W REFLEX MICROSCOPIC
Bilirubin Urine: NEGATIVE
Glucose, UA: NEGATIVE mg/dL
Hgb urine dipstick: NEGATIVE
Ketones, ur: NEGATIVE mg/dL
Leukocytes,Ua: NEGATIVE
Nitrite: NEGATIVE
Protein, ur: NEGATIVE mg/dL
Specific Gravity, Urine: 1.025 (ref 1.005–1.030)
pH: 6 (ref 5.0–8.0)

## 2021-02-01 LAB — LIPASE, BLOOD: Lipase: 38 U/L (ref 11–51)

## 2021-02-01 MED ORDER — HYDROCODONE-ACETAMINOPHEN 5-325 MG PO TABS: 1.0000 | ORAL_TABLET | Freq: Four times a day (QID) | ORAL | 0 refills | Status: DC | PRN

## 2021-02-01 MED ORDER — HYDROMORPHONE HCL 1 MG/ML IJ SOLN
0.5000 mg | Freq: Once | INTRAMUSCULAR | Status: AC
  Administered 2021-02-01: 0.5 mg via INTRAVENOUS
  Filled 2021-02-01: qty 1

## 2021-02-01 MED ORDER — HYDROMORPHONE HCL 1 MG/ML IJ SOLN
0.5000 mg | Freq: Once | INTRAMUSCULAR | Status: AC
Start: 2021-02-01 — End: 2021-02-01
  Administered 2021-02-01: 0.5 mg via INTRAVENOUS
  Filled 2021-02-01: qty 1

## 2021-02-01 MED ORDER — LIDOCAINE 5 % EX PTCH
1.0000 | MEDICATED_PATCH | Freq: Once | CUTANEOUS | Status: DC
  Administered 2021-02-01: 1 via TRANSDERMAL
  Filled 2021-02-01: qty 1

## 2021-02-01 MED ORDER — IOHEXOL 300 MG/ML  SOLN
100.0000 mL | Freq: Once | INTRAMUSCULAR | Status: AC | PRN
  Administered 2021-02-01: 100 mL via INTRAVENOUS

## 2021-02-01 MED ORDER — ONDANSETRON HCL 4 MG/2ML IJ SOLN
4.0000 mg | Freq: Once | INTRAMUSCULAR | Status: AC
  Administered 2021-02-01: 4 mg via INTRAVENOUS
  Filled 2021-02-01: qty 2

## 2021-02-01 NOTE — ED Triage Notes (Signed)
Pt reports bilateral lower back pain and LLQ pain for the past week. Pt states she has hx of an ovarian cyst that her doctor wants to remove. Denies n/v, no vaginal bleeding. Offered pt oral pain medication in triage but pt refused stating she wants to wait until she gets back to a room.
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
## 2021-02-01 NOTE — Discharge Instructions (Addendum)
-  Prescription sent to the pharmacy for Ramos.  This is narcotic pain medicine for severe pain only. Otherwise you can take tylenol for mild pain. Norco has tylenol in it, so do not take more than 4 gram of tylenol per day.    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  You should follow up with your gynecologist as soon as possible to further discuss this.  If you are unable to get into see your gynecologist I have given you information for the on call gyn oncology specialist Dr. Berline Lopes. You can call her office as needed.  Return to the emergency department for any new or worsening symptoms.

## 2021-02-03 NOTE — Telephone Encounter (Signed)
Called pt back due to concern w/ abd soreness after colonoscopy . Pt says she is not having this discomfort anymore.NO c/o chills fever or bleeding and is eating and drinking without problem

## 2021-02-07 ENCOUNTER — Telehealth: Payer: Self-pay | Admitting: *Deleted

## 2021-02-07 NOTE — Telephone Encounter (Signed)
Patient is calling and is requesting something for pain, right ankle is hurting bad, barely can walk on it. Please advise.

## 2021-02-08 MED ORDER — TRAMADOL HCL 50 MG PO TABS
50.0000 mg | ORAL_TABLET | Freq: Four times a day (QID) | ORAL | 0 refills | Status: AC | PRN
Start: 2021-02-08 — End: 2021-02-13

## 2021-02-08 MED ORDER — LIDOCAINE-PRILOCAINE 2.5-2.5 % EX CREA: 1.0000 "application " | TOPICAL_CREAM | Freq: Four times a day (QID) | CUTANEOUS | 0 refills | Status: DC | PRN

## 2021-02-08 NOTE — Telephone Encounter (Signed)
I called her back, she didn't answer and couldn't leave a HIPAA compliant VM. I would recommend we try gabapentin for this type of pain, 300mg  three times a day. I can send to her pharmacy if she wants to do this. Thanks

## 2021-02-08 NOTE — Telephone Encounter (Signed)
Called and informed patient that two prescriptions had been sent to pharmacy on file per Dr Guy Sandifer. She verbalized understanding.

## 2021-02-08 NOTE — Telephone Encounter (Signed)
Spoke with patient to give information per Dr Sherryle Lis, she said that she does not tolerate the Gabapentin, didn't like so she wanted to know if he can call her in something else. She will schedule the f/u appointment w/ Dr Jacqualyn Posey as suggested in notes.

## 2021-02-09 ENCOUNTER — Telehealth: Payer: Self-pay | Admitting: *Deleted

## 2021-02-09 NOTE — Telephone Encounter (Signed)
Called the patient and scheduled a new patient appt for 3/1 at 9 am with Dr Berline Lopes. Patient given the address and phone number for the clinic. Patient also given the policy for mask and visitors

## 2021-02-17 ENCOUNTER — Telehealth: Payer: Self-pay

## 2021-02-17 NOTE — Telephone Encounter (Signed)
LM for Katherine [place] to call the office [phone] on Monday morning to review general intake information for appointment 02-21-21.

## 2021-02-20 ENCOUNTER — Telehealth: Payer: Self-pay | Admitting: *Deleted

## 2021-02-20 ENCOUNTER — Telehealth: Payer: Self-pay

## 2021-02-20 NOTE — Telephone Encounter (Signed)
Patient called and moved her appt from tomorrow to next week

## 2021-02-20 NOTE — Telephone Encounter (Signed)
TC to review [facility] information.  No answer, left message to return call.

## 2021-02-21 ENCOUNTER — Ambulatory Visit: Payer: Medicare Other | Admitting: Podiatry

## 2021-02-21 ENCOUNTER — Inpatient Hospital Stay: Payer: 59 | Admitting: Gynecologic Oncology

## 2021-02-21 DIAGNOSIS — N83201 Unspecified ovarian cyst, right side: Secondary | ICD-10-CM

## 2021-02-28 ENCOUNTER — Encounter: Payer: Self-pay | Admitting: Gynecologic Oncology

## 2021-03-01 ENCOUNTER — Inpatient Hospital Stay: Payer: 59 | Attending: Gynecologic Oncology | Admitting: Gynecologic Oncology

## 2021-03-01 ENCOUNTER — Inpatient Hospital Stay: Payer: 59

## 2021-03-01 ENCOUNTER — Encounter: Payer: Self-pay | Admitting: Gynecologic Oncology

## 2021-03-01 ENCOUNTER — Other Ambulatory Visit: Payer: Self-pay

## 2021-03-01 VITALS — BP 147/83 | HR 70 | Temp 96.8°F | Resp 18 | Ht 63.0 in | Wt 179.0 lb

## 2021-03-01 DIAGNOSIS — Z90710 Acquired absence of both cervix and uterus: Secondary | ICD-10-CM | POA: Diagnosis not present

## 2021-03-01 DIAGNOSIS — D3911 Neoplasm of uncertain behavior of right ovary: Secondary | ICD-10-CM | POA: Diagnosis not present

## 2021-03-01 DIAGNOSIS — F419 Anxiety disorder, unspecified: Secondary | ICD-10-CM | POA: Insufficient documentation

## 2021-03-01 DIAGNOSIS — K5900 Constipation, unspecified: Secondary | ICD-10-CM | POA: Diagnosis not present

## 2021-03-01 DIAGNOSIS — Z809 Family history of malignant neoplasm, unspecified: Secondary | ICD-10-CM | POA: Insufficient documentation

## 2021-03-01 DIAGNOSIS — K219 Gastro-esophageal reflux disease without esophagitis: Secondary | ICD-10-CM | POA: Insufficient documentation

## 2021-03-01 DIAGNOSIS — N83201 Unspecified ovarian cyst, right side: Secondary | ICD-10-CM

## 2021-03-01 DIAGNOSIS — Z8249 Family history of ischemic heart disease and other diseases of the circulatory system: Secondary | ICD-10-CM | POA: Diagnosis not present

## 2021-03-01 NOTE — Patient Instructions (Signed)
It was a pleasure meeting you. I will release your CA-125 (blood test) from today when it is back, likely tomorrow. As long as this is normal, then we will plan to repeat imaging (with an ultrasound) in 3 months with a visit with me after. If anything changes with your symptoms between now and then, please call the clinic at [phone].
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
## 2021-03-02 LAB — CA 125: Cancer Antigen (CA) 125: 8.6 U/mL (ref 0.0–38.1)

## 2021-03-03 ENCOUNTER — Telehealth: Payer: Self-pay | Admitting: Oncology

## 2021-03-03 NOTE — Telephone Encounter (Signed)
Katherine [place] with CA 125 results and reviewed the plan to have repeat imaging ([place]) in 3 months and follow up with Dr. Berline Lopes.  She verbalized understanding of results and plan.

## 2021-03-07 ENCOUNTER — Other Ambulatory Visit: Payer: Self-pay | Admitting: Podiatry

## 2021-06-01 ENCOUNTER — Ambulatory Visit (HOSPITAL_COMMUNITY): Payer: 59

## 2021-06-02 ENCOUNTER — Inpatient Hospital Stay: Payer: 59 | Admitting: Gynecologic Oncology

## 2021-06-02 ENCOUNTER — Telehealth: Payer: Self-pay | Admitting: *Deleted

## 2021-06-02 NOTE — Telephone Encounter (Signed)
Rescheduled appt and spoke with the patient; patient given the appt information, dates and times

## 2021-06-02 NOTE — Telephone Encounter (Signed)
Spoke with the patient and canceled her appt for today; patient missed her [place]. Explained that the appts will be rescheduled and we will call her back

## 2021-06-07 ENCOUNTER — Ambulatory Visit (HOSPITAL_COMMUNITY): Payer: 59

## 2021-06-16 ENCOUNTER — Ambulatory Visit: Payer: 59 | Admitting: Gynecologic Oncology

## 2021-06-20 ENCOUNTER — Ambulatory Visit (HOSPITAL_COMMUNITY)
Admission: RE | Admit: 2021-06-20 | Discharge: 2021-06-20 | Disposition: A | Discharge status: Home / Self Care | Payer: 59 | Source: Ambulatory Visit | Attending: Gynecologic Oncology | Admitting: Gynecologic Oncology

## 2021-06-20 ENCOUNTER — Other Ambulatory Visit: Payer: Self-pay

## 2021-06-20 DIAGNOSIS — N83201 Unspecified ovarian cyst, right side: Secondary | ICD-10-CM

## 2021-06-28 ENCOUNTER — Encounter: Payer: Self-pay | Admitting: Gynecologic Oncology

## 2021-06-30 ENCOUNTER — Other Ambulatory Visit: Payer: Self-pay

## 2021-06-30 ENCOUNTER — Ambulatory Visit: Payer: 59 | Admitting: Gynecologic Oncology

## 2021-07-03 ENCOUNTER — Encounter: Payer: Self-pay | Admitting: Gynecologic Oncology

## 2021-07-03 ENCOUNTER — Inpatient Hospital Stay: Payer: 59 | Attending: Gynecologic Oncology | Admitting: Gynecologic Oncology

## 2021-07-03 DIAGNOSIS — N83201 Unspecified ovarian cyst, right side: Secondary | ICD-10-CM
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
## 2021-07-04 ENCOUNTER — Ambulatory Visit: Payer: 59 | Admitting: Gastroenterology

## 2021-07-07 ENCOUNTER — Ambulatory Visit: Payer: 59 | Admitting: Gynecologic Oncology

## 2021-07-07 ENCOUNTER — Telehealth: Payer: Self-pay | Admitting: *Deleted

## 2021-07-07 NOTE — Telephone Encounter (Signed)
Per Dr Berline Lopes scheduled the patient for an MRI on 11/29 at 8am. Patient given the appt date/tiime

## 2021-07-27 ENCOUNTER — Encounter: Payer: Self-pay | Admitting: Gastroenterology

## 2021-07-27 ENCOUNTER — Ambulatory Visit (INDEPENDENT_AMBULATORY_CARE_PROVIDER_SITE_OTHER): Payer: 59 | Admitting: Gastroenterology

## 2021-07-27 ENCOUNTER — Other Ambulatory Visit (INDEPENDENT_AMBULATORY_CARE_PROVIDER_SITE_OTHER): Payer: 59

## 2021-07-27 ENCOUNTER — Other Ambulatory Visit: Payer: Self-pay

## 2021-07-27 VITALS — BP 130/70 | HR 84 | Wt 186.1 lb

## 2021-07-27 DIAGNOSIS — Z86010 Personal history of colonic polyps: Secondary | ICD-10-CM

## 2021-07-27 DIAGNOSIS — K219 Gastro-esophageal reflux disease without esophagitis: Secondary | ICD-10-CM

## 2021-07-27 DIAGNOSIS — R7401 Elevation of levels of liver transaminase levels: Secondary | ICD-10-CM

## 2021-07-27 DIAGNOSIS — R748 Abnormal levels of other serum enzymes: Secondary | ICD-10-CM

## 2021-07-27 DIAGNOSIS — Z6832 Body mass index (BMI) 32.0-32.9, adult: Secondary | ICD-10-CM

## 2021-07-27 DIAGNOSIS — K449 Diaphragmatic hernia without obstruction or gangrene: Secondary | ICD-10-CM | POA: Diagnosis not present

## 2021-07-27 DIAGNOSIS — E669 Obesity, unspecified: Secondary | ICD-10-CM

## 2021-07-27 LAB — HEPATIC FUNCTION PANEL
ALT: 39 U/L — ABNORMAL HIGH (ref 0–35)
AST: 24 U/L (ref 0–37)
Albumin: 4.4 g/dL (ref 3.5–5.2)
Alkaline Phosphatase: 111 U/L (ref 39–117)
Bilirubin, Direct: 0 mg/dL (ref 0.0–0.3)
Total Bilirubin: 0.3 mg/dL (ref 0.2–1.2)
Total Protein: 7.4 g/dL (ref 6.0–8.3)

## 2021-07-27 LAB — GAMMA GT: GGT: 176 U/L — ABNORMAL HIGH (ref 7–51)

## 2021-07-27 NOTE — Patient Instructions (Signed)
If you are age 63 or older, your body mass index should be between 23-30. Your Body mass index is 32.97 kg/m. If this is out of the aforementioned range listed, please consider follow up with your Primary Care Provider.  If you are age 19 or younger, your body mass index should be between 19-25. Your Body mass index is 32.97 kg/m. If this is out of the aformentioned range listed, please consider follow up with your Primary Care Provider.   __________________________________________________________  The [facility] providers would like to encourage you to use [facility] to communicate with providers for non-urgent requests or questions.  Due to long hold times on the telephone, sending your provider a message by [facility] may be a faster and more efficient way to get a response.  Please allow 48 business hours for a response.  Please remember that this is for non-urgent requests.     Please go to the 2nd floor of this building today and schedule your labwork, [address], [address].   Please go to the 1st floor in this building and schedule your abdominal ultrasound.  Due to recent changes in healthcare laws, you may see the results of your imaging and laboratory studies on MyChart before your provider has had a chance to review them.  We understand that in some cases there may be results that are confusing or concerning to you. Not all laboratory results come back in the same time frame and the provider may be waiting for multiple results in order to interpret others.  Please give [place] 48 hours in order for your provider to thoroughly review all the results before contacting the office for clarification of your results.   Thank you for choosing me and [facility].  Vito Cirigliano, D.O.

## 2021-07-27 NOTE — Progress Notes (Signed)
Chief Complaint:    Elevated liver enzymes  GI History: 65 year old female with history of GERD and dysphagia c/b previous food impaction in 08/2018.  Longstanding history of dysphagia and being a "slow eater" and last finished meals.   GERD: -Index symptoms: Heartburn, regurgitation -Exacerbating factors: Supine, overeating -Previous medications: Prilosec, Zantac -Current medications -Barium esophagram 10/10/2018 notable for small hiatal hernia with moderate to extensive reflux, barium tablet passing into stomach without delay and normal motility. -EGD on 10/14/2018 which was notable for 1 cm axial height and 3 cm transverse with hiatal hernia, Hill grade 3 valve, stenosis at 34 cm consistent with benign peptic stricture, dilated with 20 mm balloon with appropriate mucosal disruption, otherwise normal upper and middle esophagus, normal stomach, normal duodenum.  Resolution of dysphagia with dilation   Separately, history of fatty liver with mildly elevated liver enzymes in 2019: -AST/ALT 44/77 with normal T bili and ALP of 129.   -She has had mildly elevated ALT>AST since at least 2010, with peak ALT 101 in 12/2009, but generally remain in the 60s.   -CT abdomen/pelvis in 05/2017 and notable for normal liver, pancreas, spleen; status post ccy -03/2020: HBsAb+/HBsAg- (c/w immunity), HCV- -03/2020: AST/ALT 32/42, normal ALP, T bili   History of IBS-C, previously well controlled Linzess   Endoscopic History: -EGD (02/2010, [facility]): Full reports not available, but images from EGD previously reviewed and notable for some LES laxity on retroflexed views with otherwise normal-appearing Z line.  Mild, non-H. pylori gastritis and normal duodenal biopsies noted on path report  -Colonoscopy (02/2010, [facility]): IC valve biopsied and benign, but otherwise no polyps noted on path report that was available for review -EGD (10/14/2018, Dr. Bryan Lemma): 1 cm x 3 cm hiatal hernia, Hill grade 3 valve,  stenosis 34 cm dilated with 20 mm TTS balloon -EGD (12/09/2019, Dr. Philippa Chester, [facility]): Medium sized sliding hiatal hernia, benign intrinsic stricture at 35 cm dilated with 68 French bougie - EGD (01/27/2021, Dr. Bryan Lemma): Benign nonobstructing lower esophageal stricture (path: Reflux changes), 3 cm HH, Hill grade 3, benign gastric polyp, mild gastritis - Colonoscopy (01/27/2021, Dr. Bryan Lemma): 2 polyps (TA x1, HP x1), internal hemorrhoids, normal TI.  Repeat in 7 years  HPI:     Patient is a 65 y.o. female presenting to the Gastroenterology Clinic for evaluation of elevated liver enzymes.  As outlined above, she has had mildly elevated liver enzymes since at least 2010.  Recent routine labs again notable for mildly elevated AST/ALT/ALP.  T bili normal.  Recent historical trend as below - 12/10/2018: 34/40 - 03/09/2019: 37/57 - 09/03/2019: 44/70/127 - 12/03/2019: 51/70/131 - 03/30/2020: 32/42/104 - 02/01/2021: 20/30 - 04/21/2021: 41/72/151  Otherwise normal CBC, BMP.  Labs in 03/2020 with HBsAg-, HBsAb+, c/w immunity.  HCV-, HIV-  - 02/01/2021: CT abdomen/pelvis: Normal liver, ccy, normal pancreas/spleen and GI tract.  Multiloculated septated right ovarian mass measuring 5.7 x 2.5 cm. - 03/01/2021: CA125 normal 8.6 - 06/20/2021: Complex right ovarian cystic mass with thick internal septations - 07/03/2021: Follow-up with GYN oncology.  Plan for continued close surveillance with MRI in 4-6 months.  Does have a history of aborted surgery several years ago secondary to extensive pelvic and bowel adhesions.  Today, she states she is o/w w/o GI sxs.  Offers no complaints today.  Review of systems:     No chest pain, no SOB, no fevers, no urinary sx   Past Medical History:  Diagnosis Date   Allergy    Anemia  Anxiety    Bronchitis    GERD (gastroesophageal reflux disease)    Hernia, abdominal    History of prediabetes    Ovarian mass, right 06/27/2017   Right renal mass 12/24/2009    Patient's  surgical history, family medical history, social history, medications and allergies were all reviewed in Epic    Current Outpatient Medications  Medication Sig Dispense Refill   amLODipine (NORVASC) 10 MG tablet Take by mouth.     aspirin 81 MG chewable tablet Chew by mouth.     hydrOXYzine (VISTARIL) 50 MG capsule Take 1 capsule by mouth as needed.     Multiple Vitamin (MULTIVITAMIN) tablet Take 1 tablet by mouth daily.     nitroGLYCERIN (NITROSTAT) 0.4 MG SL tablet      pantoprazole (PROTONIX) 40 MG tablet Take 1 tablet (40 mg total) by mouth 2 (two) times daily. 60 tablet 3   zolpidem (AMBIEN) 10 MG tablet Take 10 mg by mouth at bedtime as needed.     No current facility-administered medications for this visit.    Physical Exam:     There were no vitals taken for this visit.  GENERAL:  Pleasant female in NAD PSYCH: : Cooperative, normal affect Musculoskeletal:  Normal muscle tone, normal strength NEURO: Alert and oriented x 3, no focal neurologic deficits   IMPRESSION and PLAN:    1) Elevated AST/ALT 2) Elevated alkaline phosphatase  Has a long history of undulating liver enzymes, with peak ALT 101 in 2011 and peak ALP 151 in 03/2021.  Normal T bili.  As before, suspect some degree of hepatic steatosis despite prior imaging studies being otherwise normal.  Discussed multifactorial etiology for elevated ALP and will evaluate as below:  - Repeat LAEs to ensure downtrending back towards baseline - Alk Phos fractionization and GGT - RUQ [place] to evaluate for hepatic steatosis - Discussed diet/exercise with a goal of modest 10% body weight loss done slowly over weeks to months - FIB-4 score as predicted for fibrosis  3) GERD 4) Hiatal hernia - Well-controlled on Protonix - Continue current regimen - Continue antireflux lifestyle/dietary modifications  5) IBS-C - Well-controlled.  No change to current regimen  6) History of colon polyps - Repeat colonoscopy in 2029 for  ongoing polyp surveillance  7) Obesity (BMI 32.97) - As above, discussed relationship of body habitus and fatty liver infiltration/hepatic steatosis  - RTC in 6 months or sooner as needed           Katherine Miller ,DO, FACG 07/27/2021, 10:07 AM

## 2021-07-28 LAB — FIB-4 W/REFLEX NASH FIBROSURE
ALT: 38 IU/L — ABNORMAL HIGH (ref 0–32)
AST: 24 IU/L (ref 0–40)

## 2021-08-01 ENCOUNTER — Ambulatory Visit (HOSPITAL_BASED_OUTPATIENT_CLINIC_OR_DEPARTMENT_OTHER): Admission: RE | Admit: 2021-08-01 | Payer: 59 | Source: Ambulatory Visit

## 2021-08-04 ENCOUNTER — Ambulatory Visit (HOSPITAL_BASED_OUTPATIENT_CLINIC_OR_DEPARTMENT_OTHER): Admission: RE | Admit: 2021-08-04 | Payer: 59 | Source: Ambulatory Visit

## 2021-08-11 ENCOUNTER — Ambulatory Visit (HOSPITAL_BASED_OUTPATIENT_CLINIC_OR_DEPARTMENT_OTHER)
Admission: RE | Admit: 2021-08-11 | Discharge: 2021-08-11 | Disposition: A | Discharge status: Home / Self Care | Payer: 59 | Source: Ambulatory Visit | Attending: Gastroenterology | Admitting: Gastroenterology

## 2021-08-11 ENCOUNTER — Other Ambulatory Visit: Payer: Self-pay

## 2021-08-11 DIAGNOSIS — R748 Abnormal levels of other serum enzymes: Secondary | ICD-10-CM | POA: Insufficient documentation

## 2021-08-11 DIAGNOSIS — R7401 Elevation of levels of liver transaminase levels: Secondary | ICD-10-CM | POA: Insufficient documentation

## 2021-08-14 ENCOUNTER — Telehealth: Payer: Self-pay | Admitting: Gastroenterology

## 2021-08-14 NOTE — Telephone Encounter (Signed)
Pt called inquiring about imaging results.  

## 2021-08-15 NOTE — Telephone Encounter (Signed)
Patient made aware.

## 2021-08-15 NOTE — Telephone Encounter (Signed)
Patient is returning your call.  

## 2021-08-16 ENCOUNTER — Telehealth: Payer: Self-pay

## 2021-08-16 DIAGNOSIS — R748 Abnormal levels of other serum enzymes: Secondary | ICD-10-CM

## 2021-08-16 DIAGNOSIS — K449 Diaphragmatic hernia without obstruction or gangrene: Secondary | ICD-10-CM

## 2021-08-16 DIAGNOSIS — E669 Obesity, unspecified: Secondary | ICD-10-CM

## 2021-08-16 DIAGNOSIS — R7401 Elevation of levels of liver transaminase levels: Secondary | ICD-10-CM

## 2021-08-16 DIAGNOSIS — Z86010 Personal history of colonic polyps: Secondary | ICD-10-CM

## 2021-08-16 DIAGNOSIS — K219 Gastro-esophageal reflux disease without esophagitis: Secondary | ICD-10-CM

## 2021-08-16 NOTE — Telephone Encounter (Signed)
LVM for patient to come back to the lab to get her FIB-4 and her Alkaline phosphatase, isoenzymes done. She can choose [place] or [place].

## 2021-08-16 NOTE — Telephone Encounter (Signed)
Patient said she will go to [facility] lab

## 2021-08-26 ENCOUNTER — Other Ambulatory Visit: Payer: Self-pay | Admitting: Gastroenterology

## 2021-08-29 ENCOUNTER — Other Ambulatory Visit: Payer: 59

## 2021-08-29 DIAGNOSIS — R748 Abnormal levels of other serum enzymes: Secondary | ICD-10-CM

## 2021-08-29 DIAGNOSIS — K219 Gastro-esophageal reflux disease without esophagitis: Secondary | ICD-10-CM

## 2021-08-29 DIAGNOSIS — K449 Diaphragmatic hernia without obstruction or gangrene: Secondary | ICD-10-CM

## 2021-08-29 DIAGNOSIS — Z6832 Body mass index (BMI) 32.0-32.9, adult: Secondary | ICD-10-CM

## 2021-08-29 DIAGNOSIS — R7401 Elevation of levels of liver transaminase levels: Secondary | ICD-10-CM

## 2021-08-29 DIAGNOSIS — E669 Obesity, unspecified: Secondary | ICD-10-CM

## 2021-08-29 DIAGNOSIS — Z86010 Personal history of colonic polyps: Secondary | ICD-10-CM

## 2021-08-30 ENCOUNTER — Telehealth: Payer: Self-pay | Admitting: *Deleted

## 2021-08-30 LAB — FIB-4 W/REFLEX NASH FIBROSURE
ALT: 42 IU/L — ABNORMAL HIGH (ref 0–32)
AST: 35 IU/L (ref 0–40)
FIB-4 Index: 1 (ref 0.00–2.67)
Platelets: 339 10*3/uL (ref 150–450)

## 2021-08-30 NOTE — Telephone Encounter (Signed)
Attempted to return the patient's, left a message to call the office back

## 2021-08-30 NOTE — Telephone Encounter (Signed)
Patient called back and stated "I would like to request a CA-125 lab draw for my peace of mind." Message forwarded to Dr Berline Lopes.

## 2021-08-30 NOTE — Telephone Encounter (Signed)
I do not recommend that we draw a CA-125 for "peace of mind". If there is something different/concerning about follow-up imaging in November, then we can discuss the utility of getting a CA-125. It is not a diagnostic test (meaning it does not tell us whether or not there is cancer) - a normal result does not mean no cancer and an abnormal result does not signify cancer.

## 2021-08-31 NOTE — Telephone Encounter (Signed)
Spoke with Caren Griffins and notified her of Dr. Lulu Riding recommendation. Informed patient her MRI is scheduled for 11/21/21 at 0800. After her scan has resulted Dr. Berline Lopes will have a phone visit with her to discuss results in detail. Patient verbalized understanding. Instructed to call with questions or concerns.

## 2021-09-01 ENCOUNTER — Telehealth: Payer: Self-pay

## 2021-09-01 LAB — ALKALINE PHOSPHATASE, ISOENZYMES
Alkaline Phosphatase: 134 IU/L — ABNORMAL HIGH (ref 44–121)
BONE FRACTION: 34 % (ref 14–68)
INTESTINAL FRAC.: 17 % (ref 0–18)
LIVER FRACTION: 49 % (ref 18–85)

## 2021-09-01 NOTE — Telephone Encounter (Signed)
Patient stated that she would like to be checked regarding her acid reflux since she has been on the acid reflux medication for a while. Said she was wondering if she needed any test done. She said that the test she is referring to is where they had her to drink some stuff and had her to lay down and that's where they noticed her acid reflux. I asked her if it was the barium swallow and she doesn't think it was that. But wanted to know how often test like that is performed.

## 2021-09-06 ENCOUNTER — Ambulatory Visit: Payer: Self-pay

## 2021-09-06 ENCOUNTER — Encounter: Payer: Self-pay | Admitting: Orthopaedic Surgery

## 2021-09-06 ENCOUNTER — Other Ambulatory Visit: Payer: Self-pay

## 2021-09-06 ENCOUNTER — Ambulatory Visit (INDEPENDENT_AMBULATORY_CARE_PROVIDER_SITE_OTHER): Payer: 59 | Admitting: Orthopaedic Surgery

## 2021-09-06 DIAGNOSIS — M25512 Pain in left shoulder: Secondary | ICD-10-CM

## 2021-09-06 DIAGNOSIS — G8929 Other chronic pain: Secondary | ICD-10-CM | POA: Diagnosis not present

## 2021-09-06 MED ORDER — LIDOCAINE HCL 2 % IJ SOLN
2.0000 mg | INTRAMUSCULAR | Status: AC | PRN
Start: 2021-09-06 — End: 2021-09-06
  Administered 2021-09-06: 2 mg

## 2021-09-06 MED ORDER — METHYLPREDNISOLONE ACETATE 40 MG/ML IJ SUSP
40.0000 mg | INTRAMUSCULAR | Status: AC | PRN
  Administered 2021-09-06: 40 mg via INTRAMUSCULAR
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
## 2021-09-07 NOTE — Telephone Encounter (Signed)
Patient had a Barium esophagram on 10/10/2018 notable for small hiatal hernia with moderate to extensive reflux, barium tablet passing into stomach without delay and normal motility.  That study is diagnostic of reflux, but does not need to be repeated for screening/surveillance purposes. At the time of her last appointment, her reflux symptoms were well controlled on current therapy.  If that is still the case, no need for additional studies at this time.   At this point, we continue to manage her reflux medically.

## 2021-09-12 NOTE — Telephone Encounter (Signed)
LVM for patient to call back. ?

## 2021-09-12 NOTE — Telephone Encounter (Signed)
Patient made aware.

## 2021-11-21 ENCOUNTER — Other Ambulatory Visit: Payer: Self-pay

## 2021-11-21 ENCOUNTER — Ambulatory Visit (HOSPITAL_COMMUNITY)
Admission: RE | Admit: 2021-11-21 | Discharge: 2021-11-21 | Disposition: A | Discharge status: Home / Self Care | Payer: 59 | Source: Ambulatory Visit | Attending: Gynecologic Oncology | Admitting: Gynecologic Oncology

## 2021-11-21 DIAGNOSIS — N83201 Unspecified ovarian cyst, right side: Secondary | ICD-10-CM | POA: Insufficient documentation

## 2021-11-21 MED ORDER — GADOBUTROL 1 MMOL/ML IV SOLN
8.0000 mL | Freq: Once | INTRAVENOUS | Status: AC | PRN
  Administered 2021-11-21: 8 mL via INTRAVENOUS

## 2021-11-22 ENCOUNTER — Telehealth: Payer: Self-pay | Admitting: Gynecologic Oncology

## 2021-11-22 DIAGNOSIS — N83201 Unspecified ovarian cyst, right side: Secondary | ICD-10-CM

## 2021-11-22 NOTE — Telephone Encounter (Signed)
Called the patient and discussed recent MRI results.  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  Mass has been documented in the right adnexa as early as 2010.  Patient thinks that her episodes of pain may be happening a little bit more frequently and lasting a little bit longer but otherwise denies any change to how she has been feeling.  We discussed again management strategies.  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  Patient is amendable and voices understanding.  We will plan for pelvic MRI and CA-125 in 6 months.  Patient is aware to call me if her symptoms change before next imaging.  Jeral Pinch MD Gynecologic Oncology

## 2021-12-02 ENCOUNTER — Emergency Department (HOSPITAL_COMMUNITY)
Admission: EM | Admit: 2021-12-02 | Discharge: 2021-12-02 | Disposition: A | Discharge status: Home / Self Care | Payer: 59 | Attending: Emergency Medicine | Admitting: Emergency Medicine

## 2021-12-02 ENCOUNTER — Encounter (HOSPITAL_COMMUNITY): Payer: Self-pay | Admitting: Emergency Medicine

## 2021-12-02 ENCOUNTER — Emergency Department (HOSPITAL_COMMUNITY): Payer: 59

## 2021-12-02 ENCOUNTER — Other Ambulatory Visit: Payer: Self-pay

## 2021-12-02 DIAGNOSIS — Z7982 Long term (current) use of aspirin: Secondary | ICD-10-CM | POA: Insufficient documentation

## 2021-12-02 DIAGNOSIS — U071 COVID-19: Secondary | ICD-10-CM | POA: Insufficient documentation

## 2021-12-02 DIAGNOSIS — R110 Nausea: Secondary | ICD-10-CM

## 2021-12-02 DIAGNOSIS — R059 Cough, unspecified: Secondary | ICD-10-CM | POA: Diagnosis present

## 2021-12-02 LAB — CBC WITH DIFFERENTIAL/PLATELET
Abs Immature Granulocytes: 0.01 10*3/uL (ref 0.00–0.07)
Basophils Absolute: 0 10*3/uL (ref 0.0–0.1)
Basophils Relative: 0 %
Eosinophils Absolute: 0 10*3/uL (ref 0.0–0.5)
Eosinophils Relative: 0 %
HCT: 38.2 % (ref 36.0–46.0)
Hemoglobin: 12.6 g/dL (ref 12.0–15.0)
Immature Granulocytes: 0 %
Lymphocytes Relative: 41 %
Lymphs Abs: 1.7 10*3/uL (ref 0.7–4.0)
MCH: 27.7 pg (ref 26.0–34.0)
MCHC: 33 g/dL (ref 30.0–36.0)
MCV: 84 fL (ref 80.0–100.0)
Monocytes Absolute: 0.3 10*3/uL (ref 0.1–1.0)
Monocytes Relative: 7 %
Neutro Abs: 2.1 10*3/uL (ref 1.7–7.7)
Neutrophils Relative %: 52 %
Platelets: 272 10*3/uL (ref 150–400)
RBC: 4.55 MIL/uL (ref 3.87–5.11)
RDW: 13.2 % (ref 11.5–15.5)
WBC: 4.2 10*3/uL (ref 4.0–10.5)
nRBC: 0 % (ref 0.0–0.2)

## 2021-12-02 LAB — BASIC METABOLIC PANEL
Anion gap: 12 (ref 5–15)
BUN: 11 mg/dL (ref 8–23)
CO2: 22 mmol/L (ref 22–32)
Calcium: 8.5 mg/dL — ABNORMAL LOW (ref 8.9–10.3)
Chloride: 104 mmol/L (ref 98–111)
Creatinine, Ser: 0.72 mg/dL (ref 0.44–1.00)
GFR, Estimated: >60 mL/min (ref >60)
Glucose, Bld: 106 mg/dL — ABNORMAL HIGH (ref 70–99)
Potassium: 2.9 mmol/L — ABNORMAL LOW (ref 3.5–5.1)
Sodium: 138 mmol/L (ref 135–145)

## 2021-12-02 LAB — RESP PANEL BY RT-PCR (FLU A&B, COVID) ARPGX2
Influenza A by PCR: NEGATIVE
Influenza B by PCR: NEGATIVE
SARS Coronavirus 2 by RT PCR: POSITIVE — AB

## 2021-12-02 MED ORDER — ONDANSETRON 4 MG PO TBDP
4.0000 mg | ORAL_TABLET | Freq: Once | ORAL | Status: AC
  Administered 2021-12-02: 4 mg via ORAL
  Filled 2021-12-02: qty 1

## 2021-12-02 MED ORDER — POTASSIUM CHLORIDE ER 10 MEQ PO TBCR: 10.0000 meq | EXTENDED_RELEASE_TABLET | Freq: Every day | ORAL | 0 refills | Status: DC

## 2021-12-02 MED ORDER — POTASSIUM CHLORIDE CRYS ER 20 MEQ PO TBCR
40.0000 meq | EXTENDED_RELEASE_TABLET | Freq: Once | ORAL | Status: AC
  Administered 2021-12-02: 40 meq via ORAL
  Filled 2021-12-02: qty 2

## 2021-12-02 NOTE — ED Notes (Signed)
Pt appears anxious in bed. A/ox4, speaking in full and complete sentences. Pt states she has had a myriad of health problems since 11/1, but over the past 7-8 days she has developed a non productive cough, subjective SOB, body aches and weakness. Pt states son tested her for COVID today and it was positive. Denies CP. LS clear bilaterally. VSS.

## 2021-12-02 NOTE — ED Notes (Signed)
Pt NAD, a/ox4. Pt verbalizes understanding of all DC and f/u instructions. All questions answered. Pt walks with steady gait to lobby at DC.  ? ?
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
## 2021-12-02 NOTE — ED Notes (Signed)
ED Provider at bedside. 

## 2021-12-02 NOTE — Discharge Instructions (Addendum)
Return for any problem.   Covid testing performed today should be available through MyChart system tomorrow.   Your potassium today was low (2.9).  You should take oral supplementation as ordered.  You should have a recheck of  your potassium performed within 2 to 3 weeks.

## 2021-12-02 NOTE — ED Triage Notes (Signed)
Reports testing Covid + from a home test today. Reports being sick, feeling unwell w/ fatigue and malaise since 11/2. Reports decreased appetite.

## 2021-12-02 NOTE — ED Provider Notes (Signed)
Emergency Medicine Provider Triage Evaluation Note  Katherine Miller , a 65 y.o. female  was evaluated in triage.  Pt complains of cough, weakness and positive covid   Review of Systems  Positive: Body aches Negative: vomiting  Physical Exam  BP 126/82 (BP Location: Right Arm)   Pulse 84   Temp 98.5 F (36.9 C) (Oral)   Resp 15   Ht 5\' 3"  (1.6 m)   Wt 76.2 kg   SpO2 98%   BMI 29.76 kg/m  Gen:   Awake, no distress   Resp:  Normal effort  MSK:   Moves extremities without difficulty  Other:    Medical Decision Making  Medically screening exam initiated at 4:42 PM.  Appropriate orders placed.  Katherine Miller was informed that the remainder of the evaluation will be completed by another provider, this initial triage assessment does not replace that evaluation, and the importance of remaining in the ED until their evaluation is complete.     Katherine Miller, [place] 12/02/21 1644    Valarie Merino, MD 12/22/21 [phone]

## 2021-12-13 ENCOUNTER — Other Ambulatory Visit: Payer: Self-pay | Admitting: Gastroenterology

## 2021-12-14 ENCOUNTER — Telehealth: Payer: Self-pay | Admitting: *Deleted

## 2021-12-14 NOTE — Telephone Encounter (Signed)
Per Dr Berline Lopes scheduled the patient for an [place] scan for the end of May. Patient request a lab appt for 12/30 to have her CA 125 drawn

## 2021-12-22 ENCOUNTER — Inpatient Hospital Stay: Payer: 59

## 2021-12-28 ENCOUNTER — Other Ambulatory Visit: Payer: Self-pay | Admitting: Gastroenterology

## 2021-12-29 ENCOUNTER — Inpatient Hospital Stay: Payer: 59

## 2022-01-01 ENCOUNTER — Inpatient Hospital Stay: Payer: 59

## 2022-01-03 ENCOUNTER — Other Ambulatory Visit: Payer: Self-pay | Admitting: Gynecologic Oncology

## 2022-01-03 DIAGNOSIS — N83201 Unspecified ovarian cyst, right side: Secondary | ICD-10-CM

## 2022-01-04 ENCOUNTER — Inpatient Hospital Stay: Payer: 59

## 2022-01-11 ENCOUNTER — Inpatient Hospital Stay: Payer: 59

## 2022-01-11 ENCOUNTER — Other Ambulatory Visit: Payer: 59

## 2022-01-28 ENCOUNTER — Other Ambulatory Visit: Payer: Self-pay | Admitting: Gastroenterology

## 2022-01-29 ENCOUNTER — Other Ambulatory Visit: Payer: Self-pay | Admitting: General Surgery

## 2022-01-29 DIAGNOSIS — E876 Hypokalemia: Secondary | ICD-10-CM

## 2022-01-29 NOTE — Telephone Encounter (Signed)
Left a detailed voicemail for the patient that her pharmacy has requested an rx for potassium. We will need to do labs before we can call out a refill.

## 2022-04-04 ENCOUNTER — Telehealth: Payer: Self-pay | Admitting: Gastroenterology

## 2022-04-04 NOTE — Telephone Encounter (Signed)
ERROR

## 2022-04-12 ENCOUNTER — Ambulatory Visit: Payer: 59 | Admitting: Gastroenterology

## 2022-04-19 ENCOUNTER — Encounter: Payer: Self-pay | Admitting: Gastroenterology

## 2022-04-19 ENCOUNTER — Ambulatory Visit (INDEPENDENT_AMBULATORY_CARE_PROVIDER_SITE_OTHER): Payer: 59 | Admitting: Gastroenterology

## 2022-04-19 ENCOUNTER — Other Ambulatory Visit: Payer: Self-pay | Admitting: Gynecologic Oncology

## 2022-04-19 VITALS — BP 130/88 | HR 76 | Ht 63.0 in | Wt 186.0 lb

## 2022-04-19 DIAGNOSIS — K5900 Constipation, unspecified: Secondary | ICD-10-CM

## 2022-04-19 DIAGNOSIS — K219 Gastro-esophageal reflux disease without esophagitis: Secondary | ICD-10-CM

## 2022-04-19 DIAGNOSIS — Z86010 Personal history of colonic polyps: Secondary | ICD-10-CM | POA: Diagnosis not present

## 2022-04-19 DIAGNOSIS — R110 Nausea: Secondary | ICD-10-CM

## 2022-04-19 DIAGNOSIS — N83201 Unspecified ovarian cyst, right side: Secondary | ICD-10-CM

## 2022-04-19 NOTE — Progress Notes (Signed)
? ?Chief Complaint:    Nausea, constipation ? ?GI History: 66 year old female with history of GERD and dysphagia c/b previous food impaction in 08/2018.  Longstanding history of dysphagia and being a "slow eater" and last finished meals. ?  ?GERD: ?-Index symptoms: Heartburn, regurgitation ?-Exacerbating factors: Supine, overeating ?-Previous medications: Prilosec, Zantac ?-Current medications ?-Barium esophagram 10/10/2018 notable for small hiatal hernia with moderate to extensive reflux, barium tablet passing into stomach without delay and normal motility. ?-EGD on 10/14/2018 which was notable for 1 cm axial height and 3 cm transverse with hiatal hernia, Hill grade 3 valve, stenosis at 34 cm consistent with benign peptic stricture, dilated with 20 mm balloon with appropriate mucosal disruption, otherwise normal upper and middle esophagus, normal stomach, normal duodenum.  Resolution of dysphagia with dilation ?  ?Separately, history of fatty liver with mildly elevated liver enzymes in 2019: ?-AST/ALT 44/77 with normal T bili and ALP of 129.   ?-She has had mildly elevated ALT>AST since at least 2010, with peak ALT 101 in 12/2009, but generally remain in the 60s.   ?-CT abdomen/pelvis in 05/2017 and notable for normal liver, pancreas, spleen; status post ccy ?-03/2020: HBsAb+/HBsAg- (c/w immunity), HCV- ?-03/2020: AST/ALT 32/42, normal ALP, T bili ?- 07/2021: AST/ALT 24/39, ALP 111, T. bili 0.3 ?- 07/2021: RUQ [place]: Mild hepatic steatosis, s/p ccy ?- 08/2021: Fib 4 score 1.0 (low risk for advanced liver fibrosis).  ALP 134 with fractionization without elevated liver percent. ?  ?History of IBS-C, previously well controlled Linzess ?  ?Endoscopic History: ?-EGD (02/2010, [facility]): Full reports not available, but images from EGD previously reviewed and notable for some LES laxity on retroflexed views with otherwise normal-appearing Z line.  Mild, non-H. pylori gastritis and normal duodenal biopsies noted on path report   ?-Colonoscopy (02/2010, [facility]): IC valve biopsied and benign, but otherwise no polyps noted on path report that was available for review ?-EGD (10/14/2018, Dr. Bryan Lemma): 1 cm x 3 cm hiatal hernia, Hill grade 3 valve, stenosis 34 cm dilated with 20 mm TTS balloon ?-EGD (12/09/2019, Dr. Philippa Chester, [facility]): Medium sized sliding hiatal hernia, benign intrinsic stricture at 35 cm dilated with 55 French bougie ?- EGD (01/27/2021, Dr. Bryan Lemma): Benign nonobstructing lower esophageal stricture (path: Reflux changes), 3 cm HH, Hill grade 3, benign gastric polyp, mild gastritis ?- Colonoscopy (01/27/2021, Dr. Bryan Lemma): 2 polyps (TA x1, HP x1), internal hemorrhoids, normal TI.  Repeat in 7 years ? ?HPI:   ? ? ?Patient is a 66 y.o. female presenting to the Gastroenterology Clinic for evaluation of nausea/vomiting.  Last seen by me on 07/27/2021 for evaluation of elevated liver enzymes (fatty liver disease).  Was otherwise without any symptoms. ? ?She presents today for evaluation of nausea w/o emesis.  Started 1 month ago.  Symptoms have improved with dietary modifications (decreasing sugar, soda, dairy, meat).  No associated abdominal pain. Stopped all of her medications for a few weeks due to fear of exacerbating her nausea.  ? ?- 04/03/2022: Saw PCM. Normal CBC, ALT 33 o/w normal CMP ? ?No preceding changes in medication, travel, sick contacts, etc. has been having worsening constipation that started just prior to nausea onset.  She went for a colonic treatment somewhere.  Has not trialed any laxatives, stool softeners, and no longer taking her Linzess.  Does not like taking laxatives.  Her IBS-C was previously well controlled on Linzess though. ? ?Reflux symptoms otherwise had been well controlled with Protonix 40 mg. Started taking again 1 week ago.  ? ?  Review of systems:     No chest pain, no SOB, no fevers, no urinary sx  ? ?Past Medical History:  ?Diagnosis Date  ? Allergy   ? Anemia   ? Anxiety   ? Bronchitis   ?  GERD (gastroesophageal reflux disease)   ? Hernia, abdominal   ? History of prediabetes   ? Ovarian mass, right 06/27/2017  ? Right renal mass 12/24/2009  ? ? ?Patient's surgical history, family medical history, social history, medications and allergies were all reviewed in Epic  ? ? ?Current Outpatient Medications  ?Medication Sig Dispense Refill  ? pantoprazole (PROTONIX) 40 MG tablet TAKE 1 TABLET BY MOUTH TWICE A DAY 180 tablet 1  ? zolpidem (AMBIEN) 10 MG tablet Take 10 mg by mouth at bedtime as needed.    ? amLODipine (NORVASC) 10 MG tablet Take by mouth. (Patient not taking: Reported on 04/19/2022)    ? hydrOXYzine (VISTARIL) 50 MG capsule Take 1 capsule by mouth as needed. (Patient not taking: Reported on 04/19/2022)    ? nitroGLYCERIN (NITROSTAT) 0.4 MG SL tablet     ? potassium chloride (KLOR-CON) 10 MEQ tablet Take 1 tablet (10 mEq total) by mouth daily. (Patient not taking: Reported on 04/19/2022) 30 tablet 0  ? ?No current facility-administered medications for this visit.  ? ? ?Physical Exam:   ? ? ?BP 130/88   Pulse 76   Ht '5\' 3"'$  (1.6 m)   Wt 186 lb (84.4 kg)   SpO2 98%   BMI 32.95 kg/m?  ? ?GENERAL:  Pleasant female in NAD ?PSYCH: : Cooperative, normal affect ?EENT:  conjunctiva pink, mucous membranes moist, neck supple without masses ?CARDIAC:  RRR, no murmur heard, no peripheral edema ?PULM: Normal respiratory effort, lungs CTA bilaterally, no wheezing ?ABDOMEN:  Nondistended, soft, nontender. No obvious masses, no hepatomegaly,  normal bowel sounds ?SKIN:  turgor, no lesions seen ?Musculoskeletal:  Normal muscle tone, normal strength ?NEURO: Alert and oriented x 3, no focal neurologic deficits ? ? ?IMPRESSION and PLAN:   ? ?1) Nausea ?- Symptoms improving after making dietary modifications ?- Continue healthy eating habits and increase activity/exercise ?- If symptoms persist/recur, plan for gastric emptying study ?- Trial course of FD guard (or OTC peppermint oil) ? ?2) IBS-C ?- Does not want to  take MiraLAX ?- Trial Sutab for bowel purge ?- Restart Linzess after bowel purge ?- Increase daily water intake with a goal of 64 ounces/day ?- Continue healthy eating habits ? ?3) GERD ?- Well controlled when taking her Protonix ?- Continue antireflux lifestyle/dietary modifications ? ?4) History of colon polyps ?- Repeat colonoscopy 2029 for ongoing polyp surveillance ? ? ?    ? ?Lavena Bullion ,DO, FACG 04/19/2022, 3:15 PM ? ?

## 2022-04-19 NOTE — Patient Instructions (Signed)
If you are age 64 or older, your body mass index should be between 23-30. Your Body mass index is 32.95 kg/m?Marland Kitchen If this is out of the aforementioned range listed, please consider follow up with your Primary Care Provider. ? ?If you are age 21 or younger, your body mass index should be between 19-25. Your Body mass index is 32.95 kg/m?Marland Kitchen If this is out of the aformentioned range listed, please consider follow up with your Primary Care Provider.  ? ?Due to recent changes in healthcare laws, you may see the results of your imaging and laboratory studies on MyChart before your provider has had a chance to review them.  We understand that in some cases there may be results that are confusing or concerning to you. Not all laboratory results come back in the same time frame and the provider may be waiting for multiple results in order to interpret others.  Please give [place] 48 hours in order for your provider to thoroughly review all the results before contacting the office for clarification of your results.  ? ?Please purchase the following medications over the counter and take as directed: ?FD Guard ? ?Dr Bryan Lemma recommends that you complete a bowel purge (to clean out your bowels). Please do the following: ? ?We have given you a sample of sutab for your bowel purge. ? ? ? ? ?We want to thank you for trusting [facility] with your care. All of our staff and providers value the relationships we have built with our patients, and it is an honor to care for you.  ? ?We are writing to let you know that [facility] will close on May 07, 2022, and we invite you to continue to see Dr. Carmell Austria and Gerrit Heck at the [facility] [place] office location. We are consolidating our serices at these [facility] practices to better provide care. Our office staff will work with you to ensure a seamless transition.  ? ?Gerrit Heck, DO -Dr. Bryan Lemma will be movig to  [facility] at [address] [place], [place] [address], effective May 07, 2022.  Contact [phone] to schedule an appointment with him.  ? ?Carmell Austria, MD- Dr. Lyndel Safe will be movig to [facility] at [address] [place], [place] [address], effective May 07, 2022.  Contact [phone] to schedule an appointment with him.  ? ?Requesting Medical Records ?If you need to request your medical records, please follow the instructions below. Your medical records are confidential, and a copy can be transferred to another provider or released to you or another person you designate only with your permission. ? ?There are several ways to request your medical records: ?Requests for medical records can be submitted through our practice.   ?You can also request your records electronically, in your MyChart account by selecting the ?Request Health Records? tab.  ?If you need additional information on how to request records, please go to [email or web address]/, choose Patient Information, then select Request Medical Records. ?To make an appointment or if you have any questions about your health care needs, please contact our office at [phone] and one of our staff members will be glad to assist you. ?[facility] is committed to providing exceptional care for you and our community. Thank you for allowing [place] to serve your health care needs. ?Sincerely, ? ?Windy Canny, Director [facility] ?[facility] also offers convenient virtual care options. Sore throat? Sinus problems? Cold or flu symptoms? Get care  from the comfort of home with [facility] Video Visits and e-Visits. Learn more about the non-emergency conditions treated and start your virtual visit at [email or web address] ? ?Thank you for choosing me and [facility]. ? ?Gerrit Heck, D.O.  ? ? ?

## 2022-05-10 ENCOUNTER — Telehealth: Payer: Self-pay | Admitting: *Deleted

## 2022-05-10 NOTE — Telephone Encounter (Signed)
Called and scheduled the patient for a MD/Lab appt on 6/8

## 2022-05-23 ENCOUNTER — Ambulatory Visit (HOSPITAL_COMMUNITY)
Admission: RE | Admit: 2022-05-23 | Discharge: 2022-05-23 | Disposition: A | Discharge status: Home / Self Care | Payer: 59 | Source: Ambulatory Visit | Attending: Gynecologic Oncology | Admitting: Gynecologic Oncology

## 2022-05-23 DIAGNOSIS — N83201 Unspecified ovarian cyst, right side: Secondary | ICD-10-CM | POA: Insufficient documentation

## 2022-05-28 ENCOUNTER — Encounter: Payer: Self-pay | Admitting: Gynecologic Oncology

## 2022-05-31 ENCOUNTER — Other Ambulatory Visit: Payer: Self-pay

## 2022-05-31 ENCOUNTER — Inpatient Hospital Stay: Payer: 59 | Attending: Gynecologic Oncology | Admitting: Gynecologic Oncology

## 2022-05-31 ENCOUNTER — Inpatient Hospital Stay: Payer: 59 | Attending: Gynecologic Oncology

## 2022-05-31 VITALS — BP 110/90 | HR 82 | Temp 98.9°F | Resp 18 | Ht 63.0 in | Wt 179.2 lb

## 2022-05-31 DIAGNOSIS — K5900 Constipation, unspecified: Secondary | ICD-10-CM | POA: Insufficient documentation

## 2022-05-31 DIAGNOSIS — Z90710 Acquired absence of both cervix and uterus: Secondary | ICD-10-CM | POA: Insufficient documentation

## 2022-05-31 DIAGNOSIS — N83201 Unspecified ovarian cyst, right side: Secondary | ICD-10-CM | POA: Insufficient documentation
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
## 2022-05-31 NOTE — Patient Instructions (Signed)
Plan to have an MRI and Dr. Berline Lopes will call you once she has the results. Please call the office for any questions or needs.

## 2022-06-01 LAB — CA 125: Cancer Antigen (CA) 125: 7.9 U/mL (ref 0.0–38.1)

## 2022-06-08 ENCOUNTER — Ambulatory Visit (HOSPITAL_COMMUNITY): Payer: 59

## 2022-06-20 ENCOUNTER — Ambulatory Visit (HOSPITAL_COMMUNITY)
Admission: RE | Admit: 2022-06-20 | Discharge: 2022-06-20 | Disposition: A | Discharge status: Home / Self Care | Payer: 59 | Source: Ambulatory Visit | Attending: Gynecologic Oncology | Admitting: Gynecologic Oncology

## 2022-06-20 DIAGNOSIS — N83201 Unspecified ovarian cyst, right side: Secondary | ICD-10-CM | POA: Diagnosis present

## 2022-06-20 MED ORDER — GADOBUTROL 1 MMOL/ML IV SOLN
8.0000 mL | Freq: Once | INTRAVENOUS | Status: AC | PRN
  Administered 2022-06-20: 8 mL via INTRAVENOUS

## 2022-06-21 ENCOUNTER — Telehealth: Payer: Self-pay

## 2022-06-21 ENCOUNTER — Telehealth: Payer: Self-pay | Admitting: Gynecologic Oncology

## 2022-06-21 DIAGNOSIS — N83201 Unspecified ovarian cyst, right side: Secondary | ICD-10-CM

## 2022-06-21 NOTE — Telephone Encounter (Signed)
Called the patient to discuss recent MRI results. No answer. Left VM requesting callback.  Katherine Pinch MD Gynecologic Oncology

## 2022-06-21 NOTE — Telephone Encounter (Signed)
Pt called stating she was returning Dr. Lulu Riding call. CB# [phone]. Message sent to Dr.Tucker

## 2022-06-21 NOTE — Telephone Encounter (Signed)
Called patient back to discuss MRI. Reviewed results. While there continuing to be some findings that raise concern for possible malignancy, size and appearance of mass is stable over more than 1 year. CA-125 recently was normal. Discussed no evidence of metastatic disease. 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 Patient voices good understanding of all these risks. She very much wants to avoid surgery and would like to continue with surveillance. She understands the risks with this decision, and I think it is reasonable to continue with close follow-up. I will order a pelvic MRI for 6 months. I've asked her to call if symptoms change at all.  Jeral Pinch MD Gynecologic Oncology

## 2022-06-21 NOTE — Telephone Encounter (Signed)
Per Dr.Tucker, pt needs an MRI  in 6 months.  Appointment date is 12/20/22 @ 9:00am.   Pt is aware and given number to call if needs to reschedule closer to that time.

## 2022-11-26 ENCOUNTER — Ambulatory Visit (HOSPITAL_COMMUNITY)
Admission: RE | Admit: 2022-11-26 | Discharge: 2022-11-26 | Disposition: A | Discharge status: Home / Self Care | Payer: 59 | Source: Ambulatory Visit | Attending: Gynecologic Oncology | Admitting: Gynecologic Oncology

## 2022-11-26 DIAGNOSIS — N83201 Unspecified ovarian cyst, right side: Secondary | ICD-10-CM | POA: Diagnosis present

## 2022-11-26 MED ORDER — GADOBUTROL 1 MMOL/ML IV SOLN
7.5000 mL | Freq: Once | INTRAVENOUS | Status: AC | PRN
  Administered 2022-11-26: 7.5 mL via INTRAVENOUS

## 2022-12-04 ENCOUNTER — Ambulatory Visit (INDEPENDENT_AMBULATORY_CARE_PROVIDER_SITE_OTHER): Payer: 59

## 2022-12-04 ENCOUNTER — Encounter: Payer: Self-pay | Admitting: Physician Assistant

## 2022-12-04 ENCOUNTER — Ambulatory Visit (INDEPENDENT_AMBULATORY_CARE_PROVIDER_SITE_OTHER): Payer: 59 | Admitting: Physician Assistant

## 2022-12-04 DIAGNOSIS — M25571 Pain in right ankle and joints of right foot: Secondary | ICD-10-CM | POA: Diagnosis not present

## 2022-12-04 DIAGNOSIS — M25561 Pain in right knee: Secondary | ICD-10-CM

## 2022-12-04 DIAGNOSIS — G8929 Other chronic pain: Secondary | ICD-10-CM
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
## 2022-12-06 ENCOUNTER — Telehealth: Payer: Self-pay | Admitting: Gynecologic Oncology

## 2022-12-06 ENCOUNTER — Telehealth: Payer: Self-pay

## 2022-12-06 NOTE — Telephone Encounter (Signed)
Called patient to discuss MRI. No answer.  Jeral Pinch MD Gynecologic Oncology

## 2022-12-06 NOTE — Telephone Encounter (Signed)
Pt returning Dr. Lulu Riding call regarding MRI results. Pt aware Dr. Berline Lopes in [place].  Berline Lopes notified

## 2022-12-07 ENCOUNTER — Telehealth: Payer: Self-pay | Admitting: Gynecologic Oncology

## 2022-12-07 DIAGNOSIS — N83201 Unspecified ovarian cyst, right side: Secondary | ICD-10-CM

## 2022-12-07 NOTE — Telephone Encounter (Signed)
Doing well.  Denies new symptoms since her last phone visit.  Discussed MRI findings which show continued stable size complex mass without overt features concerning for malignancy.  Patient still very much wishes to avoid surgery.  I recommended repeat MRI in 6 months to assess stability.  Patient is amenable to this.  Order placed today and I reached out to my office staff to get this scheduled next week.  Jeral Pinch MD Gynecologic Oncology

## 2022-12-10 ENCOUNTER — Other Ambulatory Visit: Payer: Self-pay | Admitting: Physician Assistant

## 2022-12-10 DIAGNOSIS — Z1231 Encounter for screening mammogram for malignant neoplasm of breast: Secondary | ICD-10-CM

## 2022-12-11 ENCOUNTER — Ambulatory Visit: Payer: 59

## 2022-12-13 ENCOUNTER — Telehealth: Payer: Self-pay | Admitting: *Deleted

## 2022-12-13 NOTE — Telephone Encounter (Signed)
Per Dr Berline Lopes patient scheduled for a MRI on 6/3 at 3 pm t [facility] and a follow up appt on 6/7 at 2:45 pm with Dr Berline Lopes. Patient requested blood work to be drawn. Message sent to Dr Berline Lopes

## 2022-12-14 ENCOUNTER — Other Ambulatory Visit: Payer: Self-pay | Admitting: Gynecologic Oncology

## 2022-12-14 DIAGNOSIS — N83201 Unspecified ovarian cyst, right side: Secondary | ICD-10-CM

## 2022-12-14 NOTE — Telephone Encounter (Signed)
Per Dr Katherine Miller patient scheduled for a lab on 12/28 and 6/3
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
## 2022-12-19 ENCOUNTER — Ambulatory Visit: Payer: 59 | Attending: Physician Assistant | Admitting: Physical Therapy

## 2022-12-20 ENCOUNTER — Ambulatory Visit: Payer: 59

## 2022-12-20 ENCOUNTER — Other Ambulatory Visit (HOSPITAL_COMMUNITY): Payer: 59
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
## 2022-12-21 ENCOUNTER — Ambulatory Visit
Admission: RE | Admit: 2022-12-21 | Discharge: 2022-12-21 | Disposition: A | Discharge status: Home / Self Care | Payer: 59 | Source: Ambulatory Visit | Attending: Physician Assistant | Admitting: Physician Assistant

## 2022-12-21 DIAGNOSIS — Z1231 Encounter for screening mammogram for malignant neoplasm of breast: Secondary | ICD-10-CM

## 2022-12-21 DIAGNOSIS — M25571 Pain in right ankle and joints of right foot: Secondary | ICD-10-CM

## 2022-12-27 ENCOUNTER — Ambulatory Visit (INDEPENDENT_AMBULATORY_CARE_PROVIDER_SITE_OTHER): Payer: 59 | Admitting: Orthopedic Surgery

## 2022-12-27 DIAGNOSIS — M25571 Pain in right ankle and joints of right foot: Secondary | ICD-10-CM

## 2022-12-28 ENCOUNTER — Encounter: Payer: Self-pay | Admitting: Orthopedic Surgery

## 2022-12-28 DIAGNOSIS — M25571 Pain in right ankle and joints of right foot: Secondary | ICD-10-CM | POA: Diagnosis not present

## 2022-12-28 MED ORDER — METHYLPREDNISOLONE ACETATE 40 MG/ML IJ SUSP
40.0000 mg | INTRAMUSCULAR | Status: AC | PRN
  Administered 2022-12-28: 40 mg via INTRA_ARTICULAR

## 2022-12-28 MED ORDER — LIDOCAINE HCL 1 % IJ SOLN
2.0000 mL | INTRAMUSCULAR | Status: AC | PRN
  Administered 2022-12-28: 2 mL
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
## 2022-12-31 ENCOUNTER — Telehealth: Payer: Self-pay | Admitting: *Deleted

## 2022-12-31 NOTE — Telephone Encounter (Signed)
Per Dr Berline Lopes patient scheduled for a lab appt on 1/10 at 25 am

## 2023-01-02 ENCOUNTER — Inpatient Hospital Stay: Payer: 59 | Attending: Gynecologic Oncology

## 2023-01-24 ENCOUNTER — Ambulatory Visit: Payer: 59 | Admitting: Orthopedic Surgery

## 2023-02-19 ENCOUNTER — Other Ambulatory Visit: Payer: Self-pay | Admitting: Physician Assistant

## 2023-02-19 DIAGNOSIS — G8929 Other chronic pain: Secondary | ICD-10-CM

## 2023-03-17 ENCOUNTER — Other Ambulatory Visit: Payer: 59

## 2023-04-18 ENCOUNTER — Emergency Department (HOSPITAL_COMMUNITY): Payer: 59

## 2023-04-18 ENCOUNTER — Other Ambulatory Visit: Payer: Self-pay

## 2023-04-18 ENCOUNTER — Emergency Department (HOSPITAL_COMMUNITY)
Admission: EM | Admit: 2023-04-18 | Discharge: 2023-04-18 | Disposition: A | Discharge status: Home / Self Care | Payer: 59 | Attending: Student | Admitting: Student

## 2023-04-18 ENCOUNTER — Encounter (HOSPITAL_COMMUNITY): Payer: Self-pay | Admitting: Radiology

## 2023-04-18 DIAGNOSIS — Z1152 Encounter for screening for COVID-19: Secondary | ICD-10-CM | POA: Diagnosis not present

## 2023-04-18 DIAGNOSIS — R109 Unspecified abdominal pain: Secondary | ICD-10-CM

## 2023-04-18 DIAGNOSIS — R0789 Other chest pain: Secondary | ICD-10-CM

## 2023-04-18 DIAGNOSIS — R1013 Epigastric pain: Secondary | ICD-10-CM | POA: Diagnosis not present

## 2023-04-18 DIAGNOSIS — Z20822 Contact with and (suspected) exposure to covid-19: Secondary | ICD-10-CM | POA: Insufficient documentation

## 2023-04-18 DIAGNOSIS — R142 Eructation: Secondary | ICD-10-CM | POA: Diagnosis not present

## 2023-04-18 LAB — COMPREHENSIVE METABOLIC PANEL
ALT: 37 U/L (ref 0–44)
AST: 35 U/L (ref 15–41)
Albumin: 4.2 g/dL (ref 3.5–5.0)
Alkaline Phosphatase: 82 U/L (ref 38–126)
Anion gap: 13 (ref 5–15)
BUN: 12 mg/dL (ref 8–23)
CO2: 19 mmol/L — ABNORMAL LOW (ref 22–32)
Calcium: 8.6 mg/dL — ABNORMAL LOW (ref 8.9–10.3)
Chloride: 105 mmol/L (ref 98–111)
Creatinine, Ser: 0.84 mg/dL (ref 0.44–1.00)
GFR, Estimated: >60 mL/min (ref >60)
Glucose, Bld: 103 mg/dL — ABNORMAL HIGH (ref 70–99)
Potassium: 3.8 mmol/L (ref 3.5–5.1)
Sodium: 137 mmol/L (ref 135–145)
Total Bilirubin: 0.6 mg/dL (ref 0.3–1.2)
Total Protein: 8.1 g/dL (ref 6.5–8.1)

## 2023-04-18 LAB — TROPONIN I (HIGH SENSITIVITY): Troponin I (High Sensitivity): 4 ng/L (ref <18)

## 2023-04-18 LAB — CBC
HCT: 38.5 % (ref 36.0–46.0)
Hemoglobin: 12.4 g/dL (ref 12.0–15.0)
MCH: 28.6 pg (ref 26.0–34.0)
MCHC: 32.2 g/dL (ref 30.0–36.0)
MCV: 88.9 fL (ref 80.0–100.0)
Platelets: 282 10*3/uL (ref 150–400)
RBC: 4.33 MIL/uL (ref 3.87–5.11)
RDW: 13.2 % (ref 11.5–15.5)
WBC: 7.1 10*3/uL (ref 4.0–10.5)
nRBC: 0 % (ref 0.0–0.2)

## 2023-04-18 LAB — RESP PANEL BY RT-PCR (RSV, FLU A&B, COVID)  RVPGX2
Influenza A by PCR: NEGATIVE
Influenza B by PCR: NEGATIVE
Resp Syncytial Virus by PCR: NEGATIVE
SARS Coronavirus 2 by RT PCR: NEGATIVE

## 2023-04-18 LAB — LIPASE, BLOOD: Lipase: 28 U/L (ref 11–51)

## 2023-04-18 MED ORDER — LIDOCAINE VISCOUS HCL 2 % MT SOLN
15.0000 mL | Freq: Once | OROMUCOSAL | Status: AC
  Administered 2023-04-18: 15 mL via ORAL
  Filled 2023-04-18: qty 15

## 2023-04-18 MED ORDER — ALUM & MAG HYDROXIDE-SIMETH 200-200-20 MG/5ML PO SUSP
30.0000 mL | Freq: Once | ORAL | Status: AC
  Administered 2023-04-18: 30 mL via ORAL
  Filled 2023-04-18: qty 30

## 2023-04-18 MED ORDER — IOHEXOL 300 MG/ML  SOLN
100.0000 mL | Freq: Once | INTRAMUSCULAR | Status: AC | PRN
  Administered 2023-04-18: 100 mL via INTRAVENOUS

## 2023-04-18 MED ORDER — ONDANSETRON HCL 4 MG/2ML IJ SOLN
4.0000 mg | Freq: Once | INTRAMUSCULAR | Status: AC
  Administered 2023-04-18: 4 mg via INTRAVENOUS
  Filled 2023-04-18: qty 2

## 2023-04-18 MED ORDER — SODIUM CHLORIDE (PF) 0.9 % IJ SOLN
INTRAMUSCULAR | Status: AC
  Filled 2023-04-18: qty 50

## 2023-04-18 NOTE — ED Notes (Signed)
Unable to update pt temp due to pt eating

## 2023-04-18 NOTE — ED Triage Notes (Signed)
Pt arrives with several complaints - central chest pain, palpitations, LLQ pain, nasal congestion, watery eyes, chills. States chest pain/tightness and palpitations have been ongoing for a couple weeks. Abd pain started a few days ago - states that she has been constipated. Pt also states that she feels like she is unable to burp. Pt states that she has been having indigestion after meals and has to drink something carbonated in order to burp/get relief. Tried using Xycam allergy relief nasal swab yesterday.
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
## 2023-04-19 ENCOUNTER — Ambulatory Visit: Payer: 59 | Admitting: Physician Assistant

## 2023-05-17 ENCOUNTER — Telehealth: Payer: Self-pay

## 2023-05-17 NOTE — Telephone Encounter (Signed)
Katherine Miller states that she has been experiencing pain in her right lower abdomen over her ovary. For a bout 2 weeks. The pain has gradually become more constant. The pain is a 7/10 when she moves around. She gets relief when she puts her legs up. She states that she feels pressure on her thigh and some intermittent numbness and tingling in her right foot. No swelling noted in the right calf or leg. She is able to bear weight on her right leg with out pain. Information given to Dr. Pricilla Holm to review.  Pt in ED 04-18-23 and had a CT A/P which showed that her cyst on her right ovary was stable. Pt is currently scheduled for a MRI of the abdomen on 05-27-23 with a f/u with Dr. Pricilla Holm on 05-31-23 to f/u cyst on right ovary.  These are the earliest appointments available. Will put her on a cancellation list to be seen sooner.  If pain increases, she should go to the ED to be evaluated. Pt verbalized understanding.

## 2023-05-24 ENCOUNTER — Other Ambulatory Visit: Payer: Self-pay | Admitting: Gynecologic Oncology

## 2023-05-24 ENCOUNTER — Telehealth: Payer: Self-pay | Admitting: Surgery

## 2023-05-24 DIAGNOSIS — N83201 Unspecified ovarian cyst, right side: Secondary | ICD-10-CM

## 2023-05-24 NOTE — Telephone Encounter (Signed)
Patient called stating that when she has her appointment with Dr Pricilla Holm on 6/7 she wants to discuss surgery options with her. She has labs scheduled for 6/3 and wanted to know if it is possible for any labs needed for surgery to be also drawn at this time. Advised patient that usually pre-surgery labs needs to be done closer to surgery date so this may not be possible, but that Dr Pricilla Holm would be notified of her request. Patient had no other concerns at this time.

## 2023-05-24 NOTE — Telephone Encounter (Addendum)
Called patient to let her know that her appt has been moved up to 12:45pm and will be a 30 minute appointment so that she can discuss surgery options with Dr Pricilla Holm. Patient verbalized understanding and had no other concerns.

## 2023-05-27 ENCOUNTER — Encounter (HOSPITAL_COMMUNITY): Payer: Self-pay

## 2023-05-27 ENCOUNTER — Ambulatory Visit (HOSPITAL_COMMUNITY)
Admission: RE | Admit: 2023-05-27 | Discharge: 2023-05-27 | Disposition: A | Discharge status: Home / Self Care | Payer: 59 | Source: Ambulatory Visit | Attending: Gynecologic Oncology | Admitting: Gynecologic Oncology

## 2023-05-27 ENCOUNTER — Other Ambulatory Visit: Payer: Self-pay

## 2023-05-27 ENCOUNTER — Inpatient Hospital Stay: Payer: 59 | Attending: Gynecologic Oncology

## 2023-05-27 DIAGNOSIS — Z90710 Acquired absence of both cervix and uterus: Secondary | ICD-10-CM | POA: Diagnosis not present

## 2023-05-27 DIAGNOSIS — R1909 Other intra-abdominal and pelvic swelling, mass and lump: Secondary | ICD-10-CM | POA: Diagnosis present

## 2023-05-27 DIAGNOSIS — K660 Peritoneal adhesions (postprocedural) (postinfection): Secondary | ICD-10-CM | POA: Insufficient documentation

## 2023-05-27 DIAGNOSIS — N83201 Unspecified ovarian cyst, right side: Secondary | ICD-10-CM

## 2023-05-27 DIAGNOSIS — R109 Unspecified abdominal pain: Secondary | ICD-10-CM | POA: Diagnosis not present

## 2023-05-27 DIAGNOSIS — Z809 Family history of malignant neoplasm, unspecified: Secondary | ICD-10-CM | POA: Diagnosis not present

## 2023-05-30 ENCOUNTER — Encounter: Payer: Self-pay | Admitting: Gynecologic Oncology

## 2023-05-30 ENCOUNTER — Telehealth: Payer: Self-pay

## 2023-05-30 LAB — CA 125: Cancer Antigen (CA) 125: 9.7 U/mL (ref 0.0–38.1)
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
## 2023-05-30 NOTE — Telephone Encounter (Signed)
Called pt for meaningful use.  Pt stated that she did not go for the MRI because when she was reading the information on the contrast, it said "it puts metal in my body." Plus she is having some kidney issues.   Pt stated that she wanted to talk about surgery options and has anxiety with anesthesia. Pt stated that she needs something to calm her nerves before going back to the OR. Pt appt with Dr. Pricilla Holm on 6/7 @ 1245.

## 2023-05-31 ENCOUNTER — Inpatient Hospital Stay (HOSPITAL_BASED_OUTPATIENT_CLINIC_OR_DEPARTMENT_OTHER): Payer: 59 | Admitting: Gynecologic Oncology

## 2023-05-31 ENCOUNTER — Encounter: Payer: Self-pay | Admitting: Gynecologic Oncology

## 2023-05-31 ENCOUNTER — Other Ambulatory Visit: Payer: Self-pay

## 2023-05-31 VITALS — BP 126/76 | HR 87 | Temp 98.1°F | Resp 20 | Ht 63.39 in | Wt 186.2 lb

## 2023-05-31 DIAGNOSIS — K5900 Constipation, unspecified: Secondary | ICD-10-CM

## 2023-05-31 DIAGNOSIS — Z809 Family history of malignant neoplasm, unspecified: Secondary | ICD-10-CM

## 2023-05-31 DIAGNOSIS — Z90710 Acquired absence of both cervix and uterus: Secondary | ICD-10-CM | POA: Diagnosis not present

## 2023-05-31 DIAGNOSIS — R1909 Other intra-abdominal and pelvic swelling, mass and lump: Secondary | ICD-10-CM

## 2023-05-31 DIAGNOSIS — K660 Peritoneal adhesions (postprocedural) (postinfection): Secondary | ICD-10-CM | POA: Diagnosis not present

## 2023-05-31 DIAGNOSIS — N736 Female pelvic peritoneal adhesions (postinfective): Secondary | ICD-10-CM

## 2023-05-31 DIAGNOSIS — N83201 Unspecified ovarian cyst, right side: Secondary | ICD-10-CM

## 2023-05-31 DIAGNOSIS — R109 Unspecified abdominal pain: Secondary | ICD-10-CM | POA: Diagnosis not present

## 2023-05-31 NOTE — Patient Instructions (Signed)
It was good to see you did a.  Keep working on your diet changes to improve your bowel function.  Please reach out if you have significant episodes of pelvic pain.  As we discussed today, plan will be to get a pelvic MRI in September.  We will have a phone visit a couple of days after to discuss findings.

## 2023-06-20 ENCOUNTER — Ambulatory Visit (INDEPENDENT_AMBULATORY_CARE_PROVIDER_SITE_OTHER): Payer: 59 | Admitting: Nurse Practitioner

## 2023-06-20 ENCOUNTER — Encounter: Payer: Self-pay | Admitting: Nurse Practitioner

## 2023-06-20 VITALS — BP 122/72 | HR 68 | Ht 63.0 in | Wt 187.0 lb

## 2023-06-20 DIAGNOSIS — R110 Nausea: Secondary | ICD-10-CM | POA: Diagnosis not present

## 2023-06-20 DIAGNOSIS — R140 Abdominal distension (gaseous): Secondary | ICD-10-CM | POA: Diagnosis not present

## 2023-06-20 DIAGNOSIS — K219 Gastro-esophageal reflux disease without esophagitis: Secondary | ICD-10-CM | POA: Diagnosis not present

## 2023-06-20 DIAGNOSIS — K581 Irritable bowel syndrome with constipation: Secondary | ICD-10-CM | POA: Diagnosis not present

## 2023-06-20 MED ORDER — LINACLOTIDE 72 MCG PO CAPS: 72.0000 ug | ORAL_CAPSULE | Freq: Every day | ORAL | 3 refills | Status: AC

## 2023-06-20 NOTE — Patient Instructions (Signed)
Acid Reflux  Below are some measures you can take to possibly improve acid reflux symptoms . We may have discussed some of these today in the office. Not everything on this list may apply to you   --If you are taking anti-reflux ( GERD) medication be sure to take it 30 minutes before breakfast and if taking twice daily then also second dose should be 30 minutes before dinner.   --Avoid late meals / bedtime snacks.   --Avoid trigger foods ( foods which you know tend to aggravate you reflux symptoms). Some common trigger foods include spicy foods, fatty foods, acidic foods, chocolate and caffeine.  --Elevate the head of bed 6-8 inches on blocks or bricks. If not able to elevate the head of the bed consider purchasing a wedge pillow to sleep on.    --Weight reduction / maintain a healthy BMI ( body mass index) may be help with reflux symptoms  --Sometimes with the above mentioned "lifestyle changes" patients are able to reduce the amount of GERD medications they take. Our goal is to have you on the lowest effective dose of medication  _______________________________________________________  If your blood pressure at your visit was 140/90 or greater, please contact your primary care physician to follow up on this.  _______________________________________________________  If you are age 66 or older, your body mass index should be between 23-30. Your Body mass index is 33.13 kg/m. If this is out of the aforementioned range listed, please consider follow up with your Primary Care Provider.  If you are age 42 or younger, your body mass index should be between 19-25. Your Body mass index is 33.13 kg/m. If this is out of the aformentioned range listed, please consider follow up with your Primary Care Provider.   ________________________________________________________  The [facility] providers would like to encourage you to use [facility] to communicate with providers for non-urgent requests or questions.   Due to long hold times on the telephone, sending your provider a message by [facility] may be a faster and more efficient way to get a response.  Please allow 48 business hours for a response.  Please remember that this is for non-urgent requests.  _______________________________________________________   Bonita Quin have been scheduled for a gastric emptying scan at [facility] on July 18 at 7:30 am. Please arrive at least 30 minutes prior to your appointment for registration. Please make certain not to have anything to eat or drink after midnight the night before your test. Hold all stomach medications (ex: Zofran, phenergan, Reglan) 24 hours prior to your test. If you need to reschedule your appointment, please contact radiology scheduling at [phone]. _____________________________________________________________________ A gastric-emptying study measures how long it takes for food to move through your stomach. There are several ways to measure stomach emptying. In the most common test, you eat food that contains a small amount of radioactive material. A scanner that detects the movement of the radioactive material is placed over your abdomen to monitor the rate at which food leaves your stomach. This test normally takes about 4 hours to complete.  Due to recent changes in healthcare laws, you may see the results of your imaging and laboratory studies on MyChart before your provider has had a chance to review them.  We understand that in some cases there may be results that are confusing or concerning to you. Not all laboratory results come back in the same time frame and the provider may be waiting for multiple results in order to interpret  others.  Please give [place] 48 hours in order for your provider to thoroughly review all the results before contacting the office for clarification of your results.    It was a pleasure to see you today!  Thank you for trusting me with your gastrointestinal care!      _____________________________________________________________________
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
## 2023-06-25 NOTE — Progress Notes (Signed)
Agree with the assessment and plan as outlined by Paula Guenther, NP. ° °Aspen Deterding, DO, FACG ° °

## 2023-07-11 ENCOUNTER — Encounter (HOSPITAL_COMMUNITY): Payer: Self-pay

## 2023-07-11 ENCOUNTER — Encounter (HOSPITAL_COMMUNITY): Payer: 59

## 2023-08-29 ENCOUNTER — Telehealth: Payer: Self-pay

## 2023-08-29 NOTE — Telephone Encounter (Signed)
Ms. Minish called office stating she needs to cancel her upcoming phone visit with Dr. Pricilla Holm.  She also said she wanted to cancel the MRI scheduled for 9/6, she will be out of town.   Advised pt to call Central scheduling (number given) and reschedule MRI, and then our office can reschedule the phone visit according to MRI appointment. Pt voiced an understanding.

## 2023-08-30 ENCOUNTER — Ambulatory Visit (HOSPITAL_COMMUNITY): Payer: 59

## 2023-09-04 ENCOUNTER — Inpatient Hospital Stay: Payer: 59 | Admitting: Gynecologic Oncology

## 2023-09-17 ENCOUNTER — Ambulatory Visit (INDEPENDENT_AMBULATORY_CARE_PROVIDER_SITE_OTHER): Payer: 59 | Admitting: Gastroenterology

## 2023-09-17 ENCOUNTER — Encounter: Payer: Self-pay | Admitting: Gastroenterology

## 2023-09-17 VITALS — BP 124/80 | HR 79 | Ht 63.0 in | Wt 185.0 lb

## 2023-09-17 DIAGNOSIS — K760 Fatty (change of) liver, not elsewhere classified: Secondary | ICD-10-CM

## 2023-09-17 DIAGNOSIS — K581 Irritable bowel syndrome with constipation: Secondary | ICD-10-CM

## 2023-09-17 DIAGNOSIS — R1013 Epigastric pain: Secondary | ICD-10-CM

## 2023-09-17 DIAGNOSIS — Z860100 Personal history of colon polyps, unspecified: Secondary | ICD-10-CM

## 2023-09-17 DIAGNOSIS — R110 Nausea: Secondary | ICD-10-CM

## 2023-09-17 DIAGNOSIS — R1903 Right lower quadrant abdominal swelling, mass and lump: Secondary | ICD-10-CM

## 2023-09-17 DIAGNOSIS — N2889 Other specified disorders of kidney and ureter: Secondary | ICD-10-CM

## 2023-09-17 NOTE — Patient Instructions (Addendum)
_______________________________________________________  If your blood pressure at your visit was 140/90 or greater, please contact your primary care physician to follow up on this.  _______________________________________________________  If you are age 65 or older, your body mass index should be between 23-30. Your Body mass index is 32.77 kg/m. If this is out of the aforementioned range listed, please consider follow up with your Primary Care Provider.  If you are age 27 or younger, your body mass index should be between 19-25. Your Body mass index is 32.77 kg/m. If this is out of the aformentioned range listed, please consider follow up with your Primary Care Provider.   ________________________________________________________  The [facility] providers would like to encourage you to use [employer] to communicate with providers for non-urgent requests or questions.  Due to long hold times on the telephone, sending your provider a message by [facility] may be a faster and more efficient way to get a response.  Please allow 48 business hours for a response.  Please remember that this is for non-urgent requests.  _______________________________________________________  Please purchase the following medications over the counter and take as directed: FD gard  Please follow up in 12 months. Give [place] a call at [phone] to schedule an appointment.  It was a pleasure to see you today!  Vito Cirigliano, D.O.

## 2023-09-17 NOTE — Progress Notes (Signed)
Chief Complaint:    Nausea  GI History: 67 year old female follows in the GI clinic for the following:   1) GERD, small hiatal hernia: -Index symptoms: Heartburn, regurgitation -Exacerbating factors: Supine, overeating -Previous medications: Prilosec, Zantac -Current medications: Pantoprazole prn -Barium esophagram 10/10/2018 notable for small hiatal hernia with moderate to extensive reflux, barium tablet passing into stomach without delay and normal motility. -EGD on 10/14/2018 which was notable for 1 cm axial height and 3 cm transverse with hiatal hernia, Hill grade 3 valve, stenosis at 34 cm consistent with benign peptic stricture, dilated with 20 mm balloon with appropriate mucosal disruption, otherwise normal upper and middle esophagus, normal stomach, normal duodenum.  Resolution of dysphagia with dilation - 05/2023.  Reflux well-controlled after dietary modification and only using pantoprazole on demand  2) Dysphagia, peptic stricture.  Longstanding history of dysphagia and being a "slow eater" and last finished meals.  Did have previous food impaction in 08/2018. - Dysphagia resolved with EGD with esophageal dilation and 09/2018 as above  3) IBS-C.  Longstanding history of IBS-C and generalized abdominal discomfort.  Responsive to Linzess in the past, but she stopped taking for unclear reasons with exacerbation of symptoms.  Has had several otherwise unremarkable colonoscopies as below. - 06/20/2023: Restarted Linzess 72 mcg daily with improvement  4) Elevated liver enzymes, hepatic steatosis.  Mildly elevated liver enzymes with ALT>AST since at least 2010, with peak ALT 101 in 12/2009, but generally remain in the 60s.   -CT abdomen/pelvis in 05/2017 and notable for normal liver, pancreas, spleen; status post ccy -03/2020: HBsAb+/HBsAg- (c/w immunity), HCV- - 07/2021: RUQ [place]: Mild hepatic steatosis, s/p ccy - 08/2021: Fib 4 score 1.0 (low risk for advanced liver fibrosis).  ALP 134 with  fractionization without elevated liver percent. - 03/2023: Normal liver enzymes   5) History of colon polyps   Endoscopic History: -EGD (02/2010, [facility]): Full reports not available, but images from EGD previously reviewed and notable for some LES laxity on retroflexed views with otherwise normal-appearing Z line.  Mild, non-H. pylori gastritis and normal duodenal biopsies noted on path report  -Colonoscopy (02/2010, [facility]): IC valve biopsied and benign, but otherwise no polyps noted on path report that was available for review -EGD (10/14/2018, Dr. Barron Alvine): 1 cm x 3 cm hiatal hernia, Hill grade 3 valve, stenosis 34 cm dilated with 20 mm TTS balloon -EGD (12/09/2019, Dr. Garrison [place], [facility]): Medium sized sliding hiatal hernia, benign intrinsic stricture at 35 cm dilated with 54 French bougie - EGD (01/27/2021, Dr. Barron Alvine): Benign nonobstructing lower esophageal stricture (path: Reflux changes), 3 cm HH, Hill grade 3, benign gastric polyp, mild gastritis - Colonoscopy (01/27/2021, Dr. Barron Alvine): 2 polyps (TA x1, HP x1), internal hemorrhoids, normal TI.  Repeat in 7 years  HPI:     Patient is a 67 y.o. female presenting to the Gastroenterology Clinic for follow-up.  Last seen by Willette Cluster on 06/20/2023.  Main issue at that time was abdominal bloating and nausea.  Had planned on treating her underlying constipation more aggressively with the hopes of improving her nausea with consideration of trialing FD guard..  Restarted Linzess 72 mcg/day.  Ordered GES (not yet completed; needs to hold opioids).  Recommended cutting down on broccoli consumption.  Today, he main issue is "nervous stomach" and nausea. No emesis. Symptoms overall chronic and largely unchanged from previous.    Restarted Linzess and modified diet with improvement in bowel habits. Occasional lower abdominal cramping, but improved since restarting Linzess  and improved bowel habits.  Most recent liver enzymes in 03/2023 are  normal, along with normal CBC, lipase.  CT in 03/2023 with right adnexal lesion of 5.8 x 3.3 cm.  CA125 normal and following with GYN-Onc; MRI pelvis ordered.  CT also with 16 mm subcapsular right upper pole renal lesion and recommended follow-up with her PCM for this issue.  Of note, patient arrived >15 minutes after scheduled appointment time.  Review of systems:     No chest pain, no SOB, no fevers, no urinary sx   Past Medical History:  Diagnosis Date   Allergy    Anemia    Anxiety    Bronchitis    GERD (gastroesophageal reflux disease)    Hernia, abdominal    History of prediabetes    Ovarian mass, right 06/27/2017   Right renal mass 12/24/2009    Patient's surgical history, family medical history, social history, medications and allergies were all reviewed in Epic    Current Outpatient Medications  Medication Sig Dispense Refill   Ascorbic Acid (VITAMIN C) 100 MG tablet Take 100 mg by mouth as needed.     aspirin EC 81 MG tablet Take 81 mg by mouth as needed. Swallow whole.     cholecalciferol (VITAMIN D3) 25 MCG (1000 UNIT) tablet Take 1,000 Units by mouth as needed.     hydrOXYzine (VISTARIL) 50 MG capsule Take 50 mg by mouth 2 (two) times daily as needed for itching or anxiety.     linaclotide (LINZESS) 72 MCG capsule Take 1 capsule (72 mcg total) by mouth daily before breakfast. 30 capsule 3   nystatin cream (MYCOSTATIN) Apply 1 Application topically 2 (two) times daily as needed for dry skin.     oxyCODONE (OXY IR/ROXICODONE) 5 MG immediate release tablet Take 5 mg by mouth 3 (three) times daily.     pantoprazole (PROTONIX) 40 MG tablet TAKE 1 TABLET BY MOUTH TWICE A DAY 180 tablet 1   vitamin E 200 UNIT capsule Take 200 Units by mouth as needed.     zolpidem (AMBIEN) 10 MG tablet Take 10 mg by mouth at bedtime as needed for sleep.     No current facility-administered medications for this visit.    Physical Exam:     BP 124/80   Pulse 79   Ht 5\' 3"  (1.6 m)   Wt 185  lb (83.9 kg)   BMI 32.77 kg/m   GENERAL:  Pleasant female in NAD PSYCH: : Cooperative, normal affect NEURO: Alert and oriented x 3, no focal neurologic deficits   IMPRESSION and PLAN:    1) Nausea 2) Dyspepsia Has had an extensive evaluation to include EGD in 09/2018, 11/2019, 01/2021, along with recent CT abdomen/pelvis in 03/2023.  Additionally, recent normal CBC, CMP, lipase.  Symptoms largely related to anxiety. - Trial course of FDGuard or OTC peppermint oil  3) IBS-C Good response to restarting Linzess and dietary modification. - Continue Linzess - Maintain adequate hydration with at least 64 ounces of water daily - Would benefit from low FODMAP diet  4) History of colon polyps - Repeat colonoscopy 2029 for ongoing polyp surveillance  5) Hepatic steatosis - Liver enzymes have normalized - Continue healthy eating with regular exercise/activity  6) Right adnexal lesion - Continue follow-up in the GYN-oncology clinic  7) Right renal mass - Follow-up with PCM to discuss whether or not additional evaluation is needed   RTC in 12 months or sooner prn  Verlin Dike Delmont Prosch ,DO, FACG 09/17/2023, 9:09 AM

## 2023-11-16 ENCOUNTER — Encounter (HOSPITAL_COMMUNITY): Payer: Self-pay | Admitting: *Deleted

## 2023-11-16 ENCOUNTER — Emergency Department (HOSPITAL_COMMUNITY): Payer: 59

## 2023-11-16 ENCOUNTER — Other Ambulatory Visit: Payer: Self-pay

## 2023-11-16 ENCOUNTER — Emergency Department (HOSPITAL_COMMUNITY)
Admission: EM | Admit: 2023-11-16 | Discharge: 2023-11-17 | Disposition: A | Discharge status: Home / Self Care | Payer: 59 | Attending: Emergency Medicine | Admitting: Emergency Medicine

## 2023-11-16 DIAGNOSIS — F419 Anxiety disorder, unspecified: Secondary | ICD-10-CM | POA: Insufficient documentation

## 2023-11-16 DIAGNOSIS — R0789 Other chest pain: Secondary | ICD-10-CM | POA: Insufficient documentation

## 2023-11-16 DIAGNOSIS — R002 Palpitations: Secondary | ICD-10-CM | POA: Insufficient documentation

## 2023-11-16 DIAGNOSIS — R079 Chest pain, unspecified: Secondary | ICD-10-CM | POA: Diagnosis present

## 2023-11-16 LAB — BASIC METABOLIC PANEL
Anion gap: 8 (ref 5–15)
BUN: 12 mg/dL (ref 8–23)
CO2: 22 mmol/L (ref 22–32)
Calcium: 8.9 mg/dL (ref 8.9–10.3)
Chloride: 109 mmol/L (ref 98–111)
Creatinine, Ser: 0.76 mg/dL (ref 0.44–1.00)
GFR, Estimated: >60 mL/min (ref >60)
Glucose, Bld: 120 mg/dL — ABNORMAL HIGH (ref 70–99)
Potassium: 4 mmol/L (ref 3.5–5.1)
Sodium: 139 mmol/L (ref 135–145)

## 2023-11-16 LAB — CBC
HCT: 34.1 % — ABNORMAL LOW (ref 36.0–46.0)
Hemoglobin: 11.3 g/dL — ABNORMAL LOW (ref 12.0–15.0)
MCH: 28.1 pg (ref 26.0–34.0)
MCHC: 33.1 g/dL (ref 30.0–36.0)
MCV: 84.8 fL (ref 80.0–100.0)
Platelets: 300 10*3/uL (ref 150–400)
RBC: 4.02 MIL/uL (ref 3.87–5.11)
RDW: 13.4 % (ref 11.5–15.5)
WBC: 6.2 10*3/uL (ref 4.0–10.5)
nRBC: 0 % (ref 0.0–0.2)

## 2023-11-16 LAB — TROPONIN I (HIGH SENSITIVITY): Troponin I (High Sensitivity): 4 ng/L (ref <18)

## 2023-11-16 MED ORDER — ALUM & MAG HYDROXIDE-SIMETH 200-200-20 MG/5ML PO SUSP
30.0000 mL | Freq: Once | ORAL | Status: AC
  Administered 2023-11-17: 30 mL via ORAL
  Filled 2023-11-16: qty 30

## 2023-11-16 NOTE — ED Triage Notes (Signed)
The pt has had chest pain and palpitations for 2-3 weeks worse for the past week

## 2023-11-17 DIAGNOSIS — R0789 Other chest pain: Secondary | ICD-10-CM | POA: Diagnosis not present

## 2023-11-17 LAB — TROPONIN I (HIGH SENSITIVITY): Troponin I (High Sensitivity): 5 ng/L (ref <18)

## 2023-11-17 MED ORDER — LORAZEPAM 1 MG PO TABS
1.0000 mg | ORAL_TABLET | Freq: Once | ORAL | Status: AC
  Administered 2023-11-17: 1 mg via ORAL
  Filled 2023-11-17: qty 1

## 2023-11-17 MED ORDER — OMEPRAZOLE 20 MG PO CPDR: 20.0000 mg | DELAYED_RELEASE_CAPSULE | Freq: Every day | ORAL | 0 refills | Status: AC

## 2023-11-17 NOTE — ED Provider Notes (Signed)
[facility] Emergency Department Provider Note MRN:  [id number]  Arrival date & time: 11/17/23     Chief Complaint   Chest Pain   History of Present Illness   Katherine Miller is a 67 y.o. year-old female presents to the ED with chief complaint of palpitations, anxiety, and chest pain.  She states that she has been having chest pressure and pain for the past couple of weeks.  States that she has had associated palpitations.  Reports that she has been under a lot of stress and feels anxious, but over the past couple of days she has had some chest pressure that "felt different."  She denies fever, chills, or cough.  She states that she does have GERD and hiatal hernia.  She isn't taking anything for this currently.  She states that she uses hydroxyzine as needed for anxiety.  History provided by patient.   Review of Systems  Pertinent positive and negative review of systems noted in HPI.    Physical Exam   Vitals:   11/17/23 0242 11/17/23 0245  BP: (!) 148/81 (!) 153/88  Pulse:  62  Resp: 20 18  Temp: 97.9 F (36.6 C)   SpO2: 100% 100%    CONSTITUTIONAL:  well-appearing, NAD NEURO:  Alert and oriented x 3, CN 3-12 grossly intact EYES:  eyes equal and reactive ENT/NECK:  Supple, no stridor  CARDIO:  normal rate, regular rhythm, appears well-perfused  PULM:  No respiratory distress, CTAB GI/GU:  non-distended, no focal tenderness MSK/SPINE:  No gross deformities, no edema, moves all extremities  SKIN:  no rash, atraumatic   *Additional and/or pertinent findings included in MDM below  Diagnostic and Interventional Summary    EKG Interpretation Date/Time:  Saturday November 16 2023 22:49:28 EST Ventricular Rate:  79 PR Interval:  150 QRS Duration:  72 QT Interval:  382 QTC Calculation: 438 R Axis:   27  Text Interpretation: Normal sinus rhythm with sinus arrhythmia Nonspecific T wave abnormality Abnormal ECG When compared with ECG of  18-Apr-2023 01:59, PREVIOUS ECG IS PRESENT No significant change since last tracing Confirmed by Drema Pry [phone]) on 11/16/2023 11:10:48 PM       Labs Reviewed  BASIC METABOLIC PANEL - Abnormal; Notable for the following components:      Result Value   Glucose, Bld 120 (*)    All other components within normal limits  CBC - Abnormal; Notable for the following components:   Hemoglobin 11.3 (*)    HCT 34.1 (*)    All other components within normal limits  TROPONIN I (HIGH SENSITIVITY)  TROPONIN I (HIGH SENSITIVITY)    DG Chest 2 View  Final Result      Medications  alum & mag hydroxide-simeth (MAALOX/MYLANTA) 200-200-20 MG/5ML suspension 30 mL (30 mLs Oral Given 11/17/23 0004)  LORazepam (ATIVAN) tablet 1 mg (1 mg Oral Given 11/17/23 0120)     Procedures  /  Critical Care Procedures  ED Course and Medical Decision Making  I have reviewed the triage vital signs, the nursing notes, and pertinent available records from the EMR.  Social Determinants Affecting Complexity of Care: Patient has no clinically significant social determinants affecting this chief complaint..   ED Course: Clinical Course as of 11/17/23 0358  [facility] Nov 17, 2023  8469 Troponin I (High Sensitivity) Trop is 4->5, doubt ACS, no ischemic EKG changes, symptoms have been present for a couple of weeks and changed over the past couple of days.  Will plan  for outpatient cardiology follow-up. [RB]  0353 CBC(!) No significant leukocytosis. [RB]  0354 Hemoglobin(!): 11.3 Mild anemia, has had similar in the past. [RB]  0354 Basic metabolic panel(!) No significant electrolyte abnormality [RB]  0354 DG Chest 2 View No obvious effusion or opacity [RB]    Clinical Course User Index [RB] Roxy Horseman, PA-C    Medical Decision Making Patient here with CP/pressure, palpitations, and anxiety.  Symptoms have been ongoing for a few weeks, but recently changed and patient wanted to be evaluated.  She has been  under a lot of stress and home and had her adult granddaughter living with her.  She states that she does drink coffee and sodas.  Hx of GERD and hiatal hernia.  Could be GI etiology.  We discussed diet changes and will start omeprazole.    Labs and imaging are reassuring.  I doubt ACS.  Given age, will refer to cardiology for evaluation her symptoms.  Might need an echo or stress test, but I suspect her symptoms are more likely GI related.   Patient feels better after workup.  She did feel anxious during 2nd trop blood draw and I gave her an ativan.  Following the ativan she says that she feels much better.  Might have an element of anxiety causing her symptoms as well.    I think patient appears stable for discharge and outpatient followup.  Amount and/or Complexity of Data Reviewed Labs: ordered. Decision-making details documented in ED Course. Radiology: ordered and independent interpretation performed. Decision-making details documented in ED Course. ECG/medicine tests: ordered and independent interpretation performed. Decision-making details documented in ED Course.    Details: NSR  Risk OTC drugs. Prescription drug management.     Ddx includes but is not limited to: ACS, PE, dissection, GERD, anxiety. Doubt ACS due to negative trops and non-ischemic EKG. PE thought less likely, patient not hypoxic nor tachycardic. Normal CXR, no widened mediastinum, symptoms inconsistent with dissection and Pain is not reproducible to palpation.  No positional component doubt MSK or pericarditis.   Consultants: No consultations were needed in caring for this patient.   Treatment and Plan: I considered admission due to patient's initial presentation, but after considering the examination and diagnostic results, patient will not require admission and can be discharged with outpatient follow-up.    Final Clinical Impressions(s) / ED Diagnoses     ICD-10-CM   1. Nonspecific chest pain  R07.9  Ambulatory referral to Cardiology    2. Anxiety  F41.9     3. Palpitations  R00.2       ED Discharge Orders          Ordered    Ambulatory referral to Cardiology       Comments: If you have not heard from the Cardiology office within the next 72 hours please call [phone].   11/17/23 0252    omeprazole (PRILOSEC) 20 MG capsule  Daily        11/17/23 0252              Discharge Instructions Discussed with and Provided to Patient:    Discharge Instructions      The cardiology office should call you for an appointment.  Return to the ER if your symptoms change or worsen.  Your tests looked good tonight.          Roxy Horseman, PA-C 11/17/23 0358    Nira Conn, MD 11/17/23 [phone]

## 2023-11-17 NOTE — ED Notes (Signed)
Patient verbalizes understanding of discharge instructions. Opportunity for questioning and answers were provided. Armband removed by staff, pt discharged from ED. Ambulated out to lobby  

## 2023-11-17 NOTE — Discharge Instructions (Addendum)
The cardiology office should call you for an appointment.  Return to the ER if your symptoms change or worsen.  Your tests looked good tonight.

## 2023-11-17 NOTE — ED Notes (Signed)
Pt has become increasingly anxious, states she feels she's having an anxiety attack and is asking for meds. PA notified, orders to follow

## 2023-11-18 ENCOUNTER — Ambulatory Visit (HOSPITAL_COMMUNITY): Payer: 59

## 2023-11-29 ENCOUNTER — Ambulatory Visit (HOSPITAL_COMMUNITY): Admission: RE | Admit: 2023-11-29 | Payer: 59 | Source: Ambulatory Visit

## 2023-11-29 ENCOUNTER — Inpatient Hospital Stay: Payer: 59 | Admitting: Gynecologic Oncology

## 2023-11-29 ENCOUNTER — Encounter (HOSPITAL_COMMUNITY): Payer: Self-pay

## 2023-12-02 ENCOUNTER — Ambulatory Visit (HOSPITAL_COMMUNITY)
Admission: RE | Admit: 2023-12-02 | Discharge: 2023-12-02 | Disposition: A | Discharge status: Home / Self Care | Payer: 59 | Source: Ambulatory Visit | Attending: Gynecologic Oncology | Admitting: Gynecologic Oncology

## 2023-12-02 DIAGNOSIS — N83201 Unspecified ovarian cyst, right side: Secondary | ICD-10-CM | POA: Insufficient documentation

## 2023-12-02 MED ORDER — GADOBUTROL 1 MMOL/ML IV SOLN
10.0000 mL | Freq: Once | INTRAVENOUS | Status: AC | PRN
  Administered 2023-12-02: 10 mL via INTRAVENOUS

## 2023-12-06 ENCOUNTER — Telehealth: Payer: Self-pay

## 2023-12-06 NOTE — Telephone Encounter (Signed)
Per provider, pt's phone visit appointment was rescheduled from 12/20 to 12/19 @ 5:30. Pt agreed to date/time

## 2023-12-12 ENCOUNTER — Inpatient Hospital Stay: Payer: 59 | Attending: Gynecologic Oncology | Admitting: Gynecologic Oncology

## 2023-12-12 ENCOUNTER — Encounter: Payer: Self-pay | Admitting: Gynecologic Oncology

## 2023-12-12 DIAGNOSIS — N83201 Unspecified ovarian cyst, right side: Secondary | ICD-10-CM
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
## 2023-12-13 ENCOUNTER — Telehealth: Payer: 59 | Admitting: Gynecologic Oncology

## 2023-12-16 ENCOUNTER — Telehealth: Payer: Self-pay | Admitting: *Deleted

## 2023-12-16 NOTE — Telephone Encounter (Signed)
Per Dr Pricilla Holm patient scheduled for lab appt on 1/10 at 2:15 pm. Patient also scheduled for an [place] scan on 6/16 at 10 am. [facility] for the patient to call the office back. Also sent the patient a my chart message.

## 2023-12-31 ENCOUNTER — Telehealth: Payer: Self-pay

## 2023-12-31 NOTE — Telephone Encounter (Signed)
 LVM for patient to call back regarding a few upcoming scheduled appointments for lab and ultrasound.

## 2024-01-01 NOTE — Telephone Encounter (Signed)
 Spoke with Ms. Hildebrandt who returned call from office. Pt was given dates and times of upcoming lab and ultrasound appts. Patient request her lab appt. Be changed to a later date in March. Pt was given an appt. For CA 125 for Wednesday, March 26 th at 1245. Pt agreed to date and time and reminded of her pelvic ultrasound appt. On Monday, June 16 th. At [facility] for 1000. Pt verbalized understanding and agreed to appt. Dates and times.

## 2024-01-03 ENCOUNTER — Other Ambulatory Visit: Payer: 59

## 2024-03-18 ENCOUNTER — Inpatient Hospital Stay: Payer: 59 | Attending: Gynecologic Oncology | Admitting: Gynecologic Oncology

## 2024-03-18 DIAGNOSIS — R1904 Left lower quadrant abdominal swelling, mass and lump: Secondary | ICD-10-CM | POA: Insufficient documentation

## 2024-03-18 DIAGNOSIS — N83201 Unspecified ovarian cyst, right side: Secondary | ICD-10-CM | POA: Insufficient documentation

## 2024-03-19 ENCOUNTER — Encounter: Payer: Self-pay | Admitting: Gynecologic Oncology

## 2024-03-19 LAB — CA 125: Cancer Antigen (CA) 125: 7.3 U/mL (ref 0.0–38.1)

## 2024-03-19 NOTE — Progress Notes (Signed)
 REQUESTING PHYSICIAN:Niall Valma, PA-C  PRIMARY CARE PHYSICIAN Lannie DELENA Valma, PA-C   HISTORY OF PRESENT ILLNESS: Katherine Miller is a 68 y.o. female with past medical history of  hypertension,  History of chest pain, Palpitations, Parox SVT by cardiac monitor from Dec, 2025, Class 1 obesity,  Abnormal ECG, Prediabetes, Family history of early CAD, GERD, H/o pelvic mass, H/o right ovarian mass, per pt since 2000, Chronic pain syndrome, Cervical myofascial pain syndrome, Left arm paresthesia, History of mild anemia, Hgb 11.8 (Nl 12.0-15.0), MCV 86, from Feb, 2022, Reactive airway disease, Anxiety,  Snoring, presented for f/u for CP, SVT, SOB, palpitations, abnormal ECG.  Patient feels better.  Patient had significant emotional stress in 08-Apr-2021.  Her mother passed away and her brother was diagnosed with cancer which caused significant emotional stress at that time.  In Aug, 2024 patient's granddaughter moved to her place which caused some emotional stress to patient.   In about Nov, 2024 patient started new job which also caused emotional stress.  Patient has anxiety. Per patient she started medication for anxiety few months ago.  Patient had 3 episodes of palpitations in 04/08/2021.   Palpitations felt like racing and fluttering of the heart lasting 3 to 5 seconds.  Since 2022 patient was doing well but over Nov, 2024 patient developed palpitations again. Palpitations presented few times a week and lasted 5 to 10 seconds. Palpitations felt like fluttering and racing of the heart and were very uncomfortable as per patient.  Patient went to [facility] ER on November 16, 2023 for evaluation for palpitations. BP 153/88, HR 62, O2sat 100%. ECG is not available from [facility] to review. Chest x-ray in ER was negative. High sensitive troponin I was normal 4 and 5. WBC 6.2, Hgb 11.3 (Nl 12.0-15.0).  MCV 85, platelets 300. Sodium 139, K4.0, creatinine 0.76, GFR  more than 60, glucose 120.  Patient was discharged from ER.  7-day cardiac monitor from December, 2024 revealed asymptomatic 20 short episodes of SVT as below. Metoprolol 12.5 mg p.o. twice a day was ordered after cardiac monitor but per patient she has not started it yet.  Since last visit on December 09, 2023 patient was doing well but few days ago patient noted 3 episodes of palpitations lasting about 10 seconds each.  She felt heart fluttering. Patient denies dizziness, near-syncope or syncope.  Patient has history of episodes of chest pain/tightness at mid sternum over the in 2021/04/08.  Episodes presented at rest and sometimes with exertion. Chest tightness was 5 out of 10 and lasted only few seconds. Chest pain was not reproducible with inspiration. Patient cannot say if chest pain was reproducible or not with palpation of the chest or position changes. Stress echo was negative for ischemia from May 2022 with good functional capacity and normal LV systolic function as above.  No more episodes of chest pain.  Patient has history of dyspnea when she was walking fast in the cold weather or when people around smoked a lot. No dyspnea otherwise. Patient denies recent dyspnea.  Patient denies chest pain suggestive of angina, near syncope or syncope, orthopnoe or PND.  Patient started taking aspirin  81 mg p.o. daily in 04-16-2022per PCPs suggestion.  Later she was on magnesium oxide 200 mg p.o. daily.  Currently patient is off aspirin  but takes magnesium oxide 200 mg.  Patient can walk 1 mile without problems.  In the past patient was exercising a lot including running several miles  without problem.  Patient doesn't exercises recently.  Patient snores at night a lot.  No history of sleep apnea. Patient was referred for evaluation for sleep apnea but she has not had evaluation done.  Per patient nobody contacted her to have evaluation for sleep apnea.  Patient drinks plenty of  water daily - at least 1/2 gallon of water daily.  Patient's weight is stable over the last 4 months and weight is 186 pounds with BMI 32.9.  Prior patient gained 5 pounds over the 2.5 years. Prior patient lost 20 pounds over the year.  Prior patient gained 70 pounds when she moved to [place] area 14 years ago.  BP and heart rate are under control today with BP 122/72 and HR 77.  Per patient BP is usually under control at home but she does not check BP regularly at home.   Patient has history of hypertension but is not on medications for hypertension.  ECG on November 29, 2023 Sinus Rhythm, HR 80  with sinus arrhythmia with rate variation cv = 10. -RSR(V1) -nondiagnostic. -Minimal nonspecific T-abnormality in III, V3-V5. ABNORMAL  ECG on March 22, 2021- Sinus  Rhythm, HR 63 -   sinus arrhythmia cv = 12. - Low voltage in precordial leads.  -  Nonspecific slight ST-T changes in lead III. -Nonspecific small inverted T waves in V3 and V4.  I have reviewed recent ECG from March 09, 2021. NSR, HR 64  with rate variation cv = 10. -RSR(V1) -nondiagnostic.  - small nonspecific T-abnormality in III, V3-V5.   Recent cardiac echo from Dec, 2024 - normal LV systolic function with EF 63 to 65%,  normal LV wall motion,  normal LV diastolic function,  no significant valvular disease,  no pulmonary hypertension.  The patient was monitored for a total of 6d 23h, from December 10 until December 10, 2023. Underlying rhythm is Sinus. Min HR 53 bpm at 11:27 pm; the maximum 135 bpm; the average 74 bpm. Patient remained bradycardic 2.5 % of total time. Patient remained tachycardic 3.7 % of total time. 20 supraventricular episodes were found.  Patient was asymptomatic. Longest SVT episode 7 beat run with HR 152 bpm at 7:16 pm. Fastest episode 3 beat run with HR 173 bpm at 7:58 am.  It appears like couplet of PACs and then PVC/fusion beat versus PAC with aberrancy. Overall PVC Burden at < 0.01 %  with 2 couplets. PSVC Burden at 12.9 % with 148 couplets. There is a total of 0 patient events. The longest R-R interval is 1.17 seconds. No A. fib, no VT, no high degree AV block, no long pauses 3 seconds or more noted.   Sleep apnea could cause palpitations and arrhythmias.  Patient was referred for evaluation for sleep apnea.   I started metoprolol 12.5 mg p.o. twice daily but patient hasn't started it yet.   Stress echo from May, 2022 was negative for ischemia.   Good functional capacity - 9.5 METs, which is above average functional capacity for age and gender.   No chest pain.  The baseline ECG shows normal sinus rhythm. HR 82. Low voltage in precordial leads. Slight nonspecific ST-T changes in lead III. Slight nonspecific inverted T waves in lead aVF, V1-V4.  Electrically negative. Max HR  141 - 89 % of MPHR. Normal LV systolic function at rest with LV EF - 60-65%.   Normal diastolic function. No significant valvular disease noted.   Probably normal RVSP. It is low risk stress test report.  CXR was negative from Nov, 2024.  CT abdomen pelvis from April 18, 2023- 1. No acute findings in the abdomen or pelvis. Specifically, no  findings to explain the patient's history of left lower quadrant  pain.  2. 16 mm subcapsular right upper pole renal lesion has attenuation  too high to be a simple cyst and higher than typically seen for  proteinaceous or hemorrhagic cyst. Only minimal progression from 14  mm on the previous exam. MRI of the abdomen with and without  contrast recommended to further evaluate as neoplasm cannot be  excluded.  3. Complex multicystic right adnexal lesion measures 5.8 x 3.3 cm,  stable since pelvic MRI of 11/26/2022 when it was measured at 6.0 x 3.2 cm.  4. Tiny hiatal hernia.   CT abdomen pelvis from Feb 01, 2021- 1. No acute abnormality in the abdomen or pelvis to explain patient's left-sided abdominal pain.  2. 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   Endoscopy from Jan 27, 2021 at [facility] esophageal stenosis. Biopsied. - 1 cm hiatal hernia. - Gastroesophageal flap valve classified as Hill  Grade III (minimal fold, loose to endoscope, hiatal  hernia likely). - A single gastric polyp. Resected and retrieved. - Gastritis. Biopsied. - Normal examined duodenum. Colonoscopy- Perianal skin tags found on perianal exam. - Two 2 to 3 mm polyps in the sigmoid colon and in  the transverse colon, removed with a cold biopsy  forceps. Resected and retrieved. - Non-bleeding internal hemorrhoids. - Stool in the entire examined colon. - Foreign body in the ascending colon. Removal was successful. - The examined portion of the ileum was normal.  EMG from May, 2021 - This is a normal study. There is no evidence of peripheral neuropathy or right arm or leg focal neuropathy.   Recent blood tests from this month, March, 2025 -sodium 142, K 4.5, chloride 107, creatinine 0.78, TSH 2.6, GFR 84,  HgbA1c 6.1%, glucose 94, ALT 38, otherwise LFTs normal, magnesium 2.1,  LDL 84, HDL 53, TG 150, TC 163,  ferritin 164, TIBC 291, iron saturation 26%, folate 6.7, vitamin B12-1515. WBC 5.2, Hgb 11.6 (Nl 11.1-15.9), MCV 86, platelets 299.  From ER from November 16, 2023 - high sensitive troponin I was normal 4 and 5. WBC 6.2, Hgb 11.3 (Nl 12.0-15.0).  MCV 85, platelets 300. Sodium 139, K 4.0, creatinine 0.76, GFR more than 60, glucose 120.  From April 2024-LFTs normal.  Hgb 12.4 (Nl 12.0-15.0). MCV 88.9.  From Feb, 2022 - WBC 6, Hgb 11.8 (Nl 12.0-15.0), MCV 86,  plats 323. Lipase 38, K 3.7, glucose 102, creat 0.74, LFTs normal. GFR > 60.  From April, 2021 - K 4.3, CO2 19, chloride 107, ALT 42 and otherwise LFTs normal, creat 0.87, GFR 83.  HgbA1c 5.8%, TSH 1.83,  WBC 5.2, Hgb 12.2  (nl 11.1-15.9), MCV 83, plats 327.  From Sept, 2020 - LDL 110, HDL 62, TG 125, TC 194.  Patient doesn't smoke.  Patient smoked marijuana few months ago for a month but felt sick and quit smoking marijuana.   No history of ETOH or other than marijuana drug abuse.  Family history of premature CAD.  Patient's father had MI at age 79.     MEDICAL HISTORY: Past Medical History:  Diagnosis Date  . Anxiety   . Gastroesophageal reflux disease 09/03/2019  . Myofascial pain  syndrome, cervical 11/04/2018    SURGICAL HISTORY: Past Surgical History:  Procedure Laterality Date  . Cholecystectomy  1984  . [place]oscopy  [place]  . Upper gastrointestinal endoscopy  12/09/2019   [facility] [place]    ALLERGIES: Allergies  Allergen Reactions  . Morphine  Other    Headaches  . Codeine Nausea Only    MEDICATIONS: Current Outpatient Medications  Medication Sig Dispense Refill  . albuterol  (ACCUNEB ) 1.25 MG/3ML nebulizer solution Take 3 mLs (1.25 mg dose) by nebulization every 6 (six) hours as needed for Wheezing. 720 mL 2  . albuterol  sulfate HFA (PROVENTIL ,VENTOLIN ,PROAIR ) 108 (90 Base) MCG/ACT inhaler INHALE TWO PUFFS INTO THE LUNGS EVERY 6 HOURS AS NEEDED FOR WHEEZING OR SHORTNESS OF BREATH. 8 g 1  . benzonatate  (TESSALON  PERLES) 100 mg capsule Take one capsule (100 mg dose) by mouth 3 (three) times a day as needed for Cough. 30 capsule 0  . escitalopram (LEXAPRO) 5 MG tablet Take one tablet (5 mg dose) by mouth daily. 30 tablet 1  . magnesium oxide 400 TABS Take one half tablet (200 mg dose) by mouth daily. In the mornings 30 tablet 1  . metoprolol tartrate (LOPRESSOR) 25 mg tablet Take one half tablet (12.5 mg dose) by mouth 2 (two) times daily. Please, hold Metoprolol if systolic BP is less than 105 or HR is less than 60. 60 tablet 5  . Multiple Vitamin (MULTIVITAMIN) tablet Take one tablet by mouth daily.    SABRA nystatin (MYCOSTATIN) cream APPLY TOPICALLY TWICE A DAY 30 g 2  . oxyCODONE  HCl  (ROXICODONE ) 5 mg immediate release tablet Take one tablet (5 mg dose) by mouth 3 (three) times a day as needed.    . pantoprazole  sodium (PROTONIX ) 40 mg tablet Take one tablet (40 mg dose) by mouth 2 (two) times daily. 60 tablet 6  . zolpidem  tartrate (AMBIEN ) 10 mg tablet TAKE 1 TABLET BY MOUTH AT BEDTIME AS NEEDED FOR SLEEP FOR UP TO 30 DAYS. MAX DAILY AMOUNT=1 TABLET 30 tablet 2   No current facility-administered medications for this visit.     FAMILY HISTORY: Family History  Problem Relation Age of Onset  . Parkinsonism Mother   . Stroke Father   . Heart disease Father   . Cancer Brother     SOCIAL HISTORY: Social History   Socioeconomic History  . Marital status: Single  . Number of children: 3  Occupational History  . Occupation: Disability   Tobacco Use  . Smoking status: Never    Passive exposure: Never  . Smokeless tobacco: Never  Vaping Use  . Vaping status: Never Used  Substance and Sexual Activity  . Alcohol use: Not Currently  . Drug use: Not Currently    Comment: previously smoked marijuana for a month  . Sexual activity: Not Currently    Partners: Male    Birth control/protection: None    REVIEW OF SYSTEMS:The patient denies edema, cough, near syncope or syncope, nausea, vomiting, diarrhea, fever, chills, or bleeding. All other systems are reviewed and are negative except for that mentioned in the history of present illness.   PHYSICAL EXAM:  Vital Signs: BP 122/72 (BP Location: Left Upper Arm, Patient Position: Sitting)   Pulse 77   Ht 5' 3 (1.6 m)   Wt 186 lb (84.4 kg)   LMP  (LMP Unknown)   SpO2 97%   Breastfeeding No   BMI 32.95 kg/m  Constitutional:  Well nourished, well developed patient in no apparent distress. HENT:normocephalic,atraumatic, oropharynx moist,no  oral exudates,nose normal. Neck: Supple. No JVD. Carotid bruits: Absent. Eyes: Conjunctiva normal, no discharge. Lymphatic:no lymphadenopathy noted. Cardiovascular: RRR. S1,  S2. 1/6 systolic murmur at the left sternal border. No gallop. No rub. Respiratory: Mildly decreased breath sounds to auscultation bilaterally.  No wheezing, no rales. GI: Soft, nontender, nondistended, normal bowel sounds, no masses or hepatosplenomegaly.   Skin: Warm, dry, no erythema, no rash. Musculoskeletal: No edema, no tenderness, no cyanosis, no clubbing. Pulses: 1+ and intact bilaterally. Neurological:  Neuro exam is grossly normal with no focal deficits noted. Psychiatric: Patient is alert and oriented to person, place and time,normal affect.  ECG 12 lead  Result Date: 03/09/2021 Sinus  Rhythm  -With rate variation cv = 10. -RSR(V1) -nondiagnostic.  -  Nonspecific T-abnormality.    No results found for this or any previous visit (from the past 24 hours). Lab Results  Component Value Date   Cholesterol, Total 163 02/27/2024   Lab Results  Component Value Date   HDL 53 02/27/2024   No components found for: [facility] Lab Results  Component Value Date   Triglycerides 150 (H) 02/27/2024   No results found for: [facility] Lab Results  Component Value Date   Creatinine 0.78 02/27/2024   BUN 8 02/27/2024   Sodium 142 02/27/2024   Potassium 4.5 02/27/2024   Chloride 107 (H) 02/27/2024   CO2 22 02/27/2024   No results found for: [facility] Lab Results  Component Value Date   Hemoglobin 11.6 02/27/2024   No results found for: INR, PROTIME    ASSESSMENT: Patient Active Problem List  Diagnosis  . Obesity (BMI 30.0-34.9)  . Gastroesophageal reflux disease  . Insomnia  . Reactive airway disease without complication (*)  . Ovarian mass, right  . Paresthesia  . Prediabetes  . Acromioclavicular arthrosis  . Arm paresthesia, left  . Chronic pain syndrome  . Myofascial pain syndrome, cervical  . Neck pain  . Pain in both lower extremities  . Pelvic mass  . Rotator cuff syndrome of left shoulder  . Primary hypertension  . Palpitations  . Chest pain   . Dyspnea on exertion  . Snoring  . MDD (major depressive disorder)  . Allergic conjunctivitis  . Thyroid nodule  . GAD (generalized anxiety disorder)  . PTSD (post-traumatic stress disorder)  . Osteopenia  . Anemia  . Abnormal ECG     Joleen Stuckert is a 68 y.o. female with past medical history as above presented for f/u for chest pain, palpitations, SVT, dyspnea, abnormal ECG.  Palpitations - anxiety versus secondary to sleep apnea are on the differential. Patient snores at night a lot.  She was referred for evaluation for sleep apnea but evaluation still not done.  I strongly encouraged the patient to complete evaluation for sleep apnea.  Patient agreed.  Will give referral again. Recently patient has significant emotional stress and she started recently medication for anxiety. 7-day cardiac monitor was ordered in 2022 but was completed in December 2024.  It revealed 20 asymptomatic brief episodes of SVT longest 7 beats run with HR 152 and fastest 3 beat run with HR 173.  Minimum heart rate 53, maximum 135 and average 74.  Metoprolol 12.5 mg p.o. twice daily was ordered but patient has not started it yet.  I explained to patient purpose of metoprolol, its effect on arrhythmias and she agreed to start it. Patient drinks enough water daily. Patient avoids caffeine. K noted 4.5, mag 2.1, TSH 2.6 from this month March 2025.  20 episodes of asymptomatic brief SVTs by cardiac monitor from December 2024 as above-see palpitations above.  Chest pain - very atypical as above. Pleuritic CP, GI etiology of CP, musculoskeletal CP, anxiety are on the differential.   No chest pain suggestive of angina. No more episodes of chest pain. Stress echo from May 2022 was negative for ischemia with good functional capacity 9.5 METS and normal LV systolic function as above.  Abnormal ECG as above-ECG was mildly abnormal during recent visit in December 2024 but no significant different from previous  ECG from 2022 as above. Patient denies any more chest pain. Stress echo from May 2022 was negative for ischemia with good functional capacity 9.5 METS and normal LV systolic function as above.  SOB, dyspnea on exertion -patient has known reactive airway disease.   Episodes of dyspnea presented when patient walked fast in the cold weather or when she inhaled smoke, no dyspnea otherwise as per patient.   Dyspnea is likely secondary to reactive airway disease.   Patient has risk factors for CAD-HTN, age, family history of early CAD, mild obesity, slight HLP. Stress echo from May, 2022 was negative for ischemia with good functional capacity and normal LV systolic function from May 2022 as above. No recent dyspnea noted as per patient.  Snoring-possible sleep apnea.  Patient was referred for evaluation for sleep apnea in 2022 and evaluation is still not done.  I strongly urged patient to complete evaluation for sleep apnea.  Patient agreed. Patient snores at night a lot.  Per patient nobody contacted her to have evaluation for sleep apnea And sleep apnea evaluation was not done. See obesity below.  Hypertension-blood pressure is under control today.   Patient is not on medications for chest burning sensation Per patient BP remains usually under control at home but patient does not check it regularly.   I strongly encouraged patient to get BP machine and start checking blood pressure and heart rate daily and let us  know abnormal results in few days.    Family history of early CAD- noted.  Slight hyperlipidemia-patient is on not on meds for hyperlipidemia. The goal of LDL cholesterol less than 899, HDL cholesterol more than 50, Triglycerides less than 150.  Previous lipid panel was almost at goal as above.  The most recent lipid panel from March 2025 with LDL at goal 84, HDL at goal 53 and TG borderline elevated 150. See obesity below.   Further management of slight HLP as per PCP.  Class I  obesity- Patient's weight is stable over the last 4 months and weight is 186 pounds with BMI 32.9.  Prior patient gained 5 pounds over the 2.5 years. Prior patient lost 20 pounds over the year.  Prior patient gained 70 pounds when she moved to [place] area 14 years ago. I strongly encouraged patient to stay on low salt, low cholesterol diet, walk daily and lose weight.  Mild normocytic anemia on and off as above-hemoglobin noted normal 11.6 (Nl 11.1-15.9), MCV 86 from March, 2025.  Per patient she has chronic anemia for several years. Previous EGD and colonoscopy as above with resection 2 polyps. Iron saturation 26%, TIBC 291, ferritin 164, vitamin B12-1515 from March 2025. Patient denies bleeding. Further evaluation and management of mild anemia as per PCP.    PLAN:  Follow up lipid panel and electrolytes, kidney function and liver function tests as per your PCP.  Metoprolol 25mg  1/2 pill po twice a day. Please, hold Metoprolol if systolic BP  is less than 105 or HR is less than 60.  Please complete evaluation for sleep apnea.  Keep checking blood pressure and heart rate daily and record it and let us  know if any abnormal readings noted. Please check blood pressure and heart rate after sitting and relaxing for 5 minutes or more. The goal of systolic blood pressure 100-130, diastolic BP 60-85, HR 60-80.  Please stay on low salt, Mediterranean diet, walk daily and continue losing weight. Please see the list of Mediterranean diet below.   Drink plenty of water - about 64 ounces or more a day - to avoid dehydration and volume depletion.  Please avoid caffeine.  Avoid any strenuous activities.  Please avoid alcohol.  If you develop significant chest pain, shortness of breath, palpitations, near-syncope or syncope or lightheadedness, please call 911 and be transferred to emergency room for evaluation.  We will follow the patient in 6 months or earlier if needed.  Care plan and  follow-up as discussed or as needed if any worsening symptoms or change in condition. Pt expressed understanding. No barriers to meeting goals. After visit summary was given to the patient.   Youlanda Juba, MD, PhD  Documentation for time-based billing:  Total time spent of date of service was 45 minutes.  Patient care activities included 45 minutes.  Patient care activities included preparing to see the patient such as reviewing the patient records, performing a medically appropriate history and physical examination, counseling and educating the patient and documenting clinical information in the electronic record.   Note: This documented was generated using voice recognition software. There may be unintended transcription errors that were not detected upon document review.

## 2024-03-20 ENCOUNTER — Telehealth: Payer: Self-pay | Admitting: *Deleted

## 2024-03-20 NOTE — Telephone Encounter (Signed)
 Spoke with Katherine Miller this morning and relayed message from Dr. Pricilla Holm that patient's CA-125 is normal.  Pt was very pleased and thanked the office for calling.

## 2024-03-20 NOTE — Telephone Encounter (Signed)
-----   Message from Carver Fila sent at 03/20/2024  8:37 AM EDT ----- Could you please let her know CA-125 is normal? Thank you

## 2024-03-20 NOTE — Progress Notes (Signed)
 Could you please let her know CA-125 is normal? Thank you

## 2024-06-08 ENCOUNTER — Ambulatory Visit (HOSPITAL_COMMUNITY): Payer: 59

## 2024-08-07 ENCOUNTER — Other Ambulatory Visit: Payer: Self-pay | Admitting: Gynecologic Oncology

## 2024-08-07 ENCOUNTER — Telehealth: Payer: Self-pay

## 2024-08-07 ENCOUNTER — Ambulatory Visit (HOSPITAL_COMMUNITY): Admission: RE | Admit: 2024-08-07 | Source: Ambulatory Visit

## 2024-08-07 DIAGNOSIS — N83201 Unspecified ovarian cyst, right side: Secondary | ICD-10-CM

## 2024-08-07 NOTE — Telephone Encounter (Signed)
 Spoke with patient in regards to lab work prior to US  appointment on 08/13/2024.  Lab orders placed. Appt made for 08/11/24 for lab work.  She voiced understanding.

## 2024-08-11 ENCOUNTER — Telehealth: Payer: Self-pay

## 2024-08-11 ENCOUNTER — Other Ambulatory Visit

## 2024-08-11 ENCOUNTER — Other Ambulatory Visit: Payer: Self-pay | Admitting: Gynecologic Oncology

## 2024-08-11 DIAGNOSIS — N83201 Unspecified ovarian cyst, right side: Secondary | ICD-10-CM

## 2024-08-11 NOTE — Telephone Encounter (Signed)
 Katherine Miller called stating she needs to reschedule her upcoming lab and ultrasound appointments for personal reasons at this time. She is requesting they be moved out to November.   Lab rescheduled to 11/19 at 3:30p, Central scheduling number given for her to r/s the ultrasound.

## 2024-08-13 ENCOUNTER — Ambulatory Visit (HOSPITAL_COMMUNITY)

## 2024-11-10 ENCOUNTER — Other Ambulatory Visit: Payer: Self-pay | Admitting: Gynecologic Oncology

## 2024-11-10 DIAGNOSIS — N83201 Unspecified ovarian cyst, right side: Secondary | ICD-10-CM

## 2024-11-11 ENCOUNTER — Inpatient Hospital Stay: Attending: Gynecologic Oncology

## 2024-11-11 ENCOUNTER — Ambulatory Visit (HOSPITAL_COMMUNITY)
